# Patient Record
Sex: Female | Born: 1955 | Race: White | Hispanic: No | Smoking: Current every day smoker
Health system: Southern US, Community
[De-identification: ages and names within clinical notes are randomized; demographics above are authoritative.]

## PROBLEM LIST (undated history)

## (undated) DIAGNOSIS — I10 Essential (primary) hypertension: Secondary | ICD-10-CM

## (undated) DIAGNOSIS — E119 Type 2 diabetes mellitus without complications: Secondary | ICD-10-CM

## (undated) DIAGNOSIS — H359 Unspecified retinal disorder: Secondary | ICD-10-CM

## (undated) DIAGNOSIS — M1990 Unspecified osteoarthritis, unspecified site: Secondary | ICD-10-CM

## (undated) HISTORY — DX: Unspecified retinal disorder: H35.9

## (undated) HISTORY — DX: Unspecified osteoarthritis, unspecified site: M19.90

## (undated) HISTORY — DX: Essential (primary) hypertension: I10

## (undated) HISTORY — PX: EYE SURGERY: SHX253

## (undated) HISTORY — PX: WRIST SURGERY: SHX841

## (undated) HISTORY — PX: FRACTURE SURGERY: SHX138

## (undated) HISTORY — PX: CHOLECYSTECTOMY: SHX55

## (undated) HISTORY — PX: TUBAL LIGATION: SHX77

## (undated) HISTORY — DX: Type 2 diabetes mellitus without complications: E11.9

## (undated) HISTORY — PX: FOOT SURGERY: SHX648

---

## 2000-12-10 ENCOUNTER — Other Ambulatory Visit: Admission: RE | Admit: 2000-12-10 | Discharge: 2000-12-10 | Payer: Self-pay | Admitting: Family Medicine

## 2001-02-27 ENCOUNTER — Encounter: Payer: Self-pay | Admitting: *Deleted

## 2001-02-27 ENCOUNTER — Emergency Department (HOSPITAL_COMMUNITY): Admission: EM | Admit: 2001-02-27 | Discharge: 2001-02-27 | Payer: Self-pay | Admitting: *Deleted

## 2001-04-23 ENCOUNTER — Encounter: Admission: RE | Admit: 2001-04-23 | Discharge: 2001-05-16 | Payer: Self-pay | Admitting: Family Medicine

## 2001-12-16 ENCOUNTER — Other Ambulatory Visit: Admission: RE | Admit: 2001-12-16 | Discharge: 2001-12-16 | Payer: Self-pay | Admitting: Family Medicine

## 2002-12-21 ENCOUNTER — Ambulatory Visit (HOSPITAL_COMMUNITY): Admission: RE | Admit: 2002-12-21 | Discharge: 2002-12-21 | Payer: Self-pay | Admitting: Ophthalmology

## 2004-05-23 ENCOUNTER — Other Ambulatory Visit: Admission: RE | Admit: 2004-05-23 | Discharge: 2004-05-23 | Payer: Self-pay | Admitting: Family Medicine

## 2007-10-02 ENCOUNTER — Ambulatory Visit (HOSPITAL_COMMUNITY): Admission: RE | Admit: 2007-10-02 | Discharge: 2007-10-02 | Payer: Self-pay | Admitting: Ophthalmology

## 2007-10-13 ENCOUNTER — Encounter: Payer: Self-pay | Admitting: Gastroenterology

## 2007-10-28 ENCOUNTER — Ambulatory Visit: Payer: Self-pay | Admitting: Gastroenterology

## 2007-11-11 ENCOUNTER — Encounter: Payer: Self-pay | Admitting: Gastroenterology

## 2007-11-11 ENCOUNTER — Ambulatory Visit: Payer: Self-pay | Admitting: Gastroenterology

## 2007-11-11 HISTORY — PX: COLONOSCOPY: SHX174

## 2007-11-17 ENCOUNTER — Encounter: Payer: Self-pay | Admitting: Gastroenterology

## 2009-12-27 ENCOUNTER — Encounter: Admission: RE | Admit: 2009-12-27 | Discharge: 2009-12-27 | Payer: Self-pay | Admitting: Family Medicine

## 2010-12-14 LAB — BASIC METABOLIC PANEL
BUN: 9
CO2: 27
Calcium: 9.3
Chloride: 106
Creatinine, Ser: 0.72
GFR calc Af Amer: >60
GFR calc non Af Amer: >60
Glucose, Bld: 119 — ABNORMAL HIGH
Potassium: 3.8
Sodium: 139

## 2010-12-14 LAB — HEMOGLOBIN AND HEMATOCRIT, BLOOD: Hemoglobin: 14.9

## 2013-05-21 ENCOUNTER — Other Ambulatory Visit (HOSPITAL_COMMUNITY): Payer: Self-pay | Admitting: Family Medicine

## 2013-05-21 ENCOUNTER — Ambulatory Visit (HOSPITAL_COMMUNITY)
Admission: RE | Admit: 2013-05-21 | Discharge: 2013-05-21 | Disposition: A | Discharge status: Home / Self Care | Payer: Disability Insurance | Source: Ambulatory Visit | Attending: Family Medicine | Admitting: Family Medicine

## 2013-05-21 DIAGNOSIS — M47817 Spondylosis without myelopathy or radiculopathy, lumbosacral region: Secondary | ICD-10-CM | POA: Insufficient documentation

## 2013-05-21 DIAGNOSIS — M545 Low back pain, unspecified: Secondary | ICD-10-CM

## 2014-04-02 ENCOUNTER — Telehealth: Payer: Self-pay | Admitting: Family Medicine

## 2014-04-02 ENCOUNTER — Encounter: Payer: Self-pay | Admitting: Family Medicine

## 2014-04-02 ENCOUNTER — Ambulatory Visit (INDEPENDENT_AMBULATORY_CARE_PROVIDER_SITE_OTHER): Payer: Self-pay | Admitting: Family Medicine

## 2014-04-02 VITALS — BP 158/88 | HR 75 | Temp 96.7°F | Ht 64.0 in | Wt 223.4 lb

## 2014-04-02 DIAGNOSIS — M25511 Pain in right shoulder: Secondary | ICD-10-CM

## 2014-04-02 MED ORDER — HYDROCODONE-ACETAMINOPHEN 5-325 MG PO TABS: 1.0000 | ORAL_TABLET | Freq: Four times a day (QID) | ORAL | Status: DC | PRN

## 2014-04-02 NOTE — Progress Notes (Signed)
   Subjective:    Patient ID: Kimberly Donaldson, female    DOB: 10/02/1955, 59 y.o.   MRN: [id number]  HPI C/o back pain that is severe radiating into her right back.  Review of Systems  Constitutional: Negative for fever.  HENT: Negative for ear pain.   Eyes: Negative for discharge.  Respiratory: Negative for cough.   Cardiovascular: Negative for chest pain.  Gastrointestinal: Negative for abdominal distention.  Endocrine: Negative for polyuria.  Genitourinary: Negative for difficulty urinating.  Musculoskeletal: Negative for gait problem and neck pain.  Skin: Negative for color change and rash.  Neurological: Negative for speech difficulty and headaches.  Psychiatric/Behavioral: Negative for agitation.       Objective:    BP 158/88 mmHg  Pulse 75  Temp(Src) 96.7 F (35.9 C) (Oral)  Ht 5\' 4"  (1.626 m)  Wt 223 lb 6.4 oz (101.334 kg)  BMI 38.33 kg/m2 Physical Exam  Constitutional: She is oriented to person, place, and time. She appears well-developed and well-nourished.  HENT:  Head: Normocephalic and atraumatic.  Mouth/Throat: Oropharynx is clear and moist.  Eyes: Pupils are equal, round, and reactive to light.  Neck: Normal range of motion. Neck supple.  Cardiovascular: Normal rate and regular rhythm.   No murmur heard. Pulmonary/Chest: Effort normal and breath sounds normal.  Abdominal: Soft. Bowel sounds are normal. There is no tenderness.  Neurological: She is alert and oriented to person, place, and time.  Skin: Skin is warm and dry.  Psychiatric: She has a normal mood and affect.          Assessment & Plan:     ICD-9-CM ICD-10-CM   1. Pain in joint, shoulder region, right 719.41 M25.511 HYDROcodone-acetaminophen (NORCO) 5-325 MG per tablet     No Follow-up on file.  Lysbeth Penner FNP

## 2014-04-02 NOTE — Telephone Encounter (Signed)
Patient does not have any insurance at this time. And she is currently not on any medications.  Appointment given for today with [facility].

## 2014-04-22 ENCOUNTER — Other Ambulatory Visit: Payer: Self-pay | Admitting: Family Medicine

## 2014-04-23 NOTE — Telephone Encounter (Signed)
Last seen and filled 04/02/14 for #30 taking q 6 hours prn

## 2014-04-23 NOTE — Telephone Encounter (Signed)
Informed pt written RF at office to be picked up

## 2014-05-13 ENCOUNTER — Ambulatory Visit (INDEPENDENT_AMBULATORY_CARE_PROVIDER_SITE_OTHER): Payer: Self-pay | Admitting: Family Medicine

## 2014-05-13 ENCOUNTER — Telehealth: Payer: Self-pay | Admitting: Family Medicine

## 2014-05-13 ENCOUNTER — Encounter: Payer: Self-pay | Admitting: Family Medicine

## 2014-05-13 VITALS — BP 146/69 | HR 73 | Temp 98.3°F | Ht 64.0 in | Wt 218.0 lb

## 2014-05-13 DIAGNOSIS — M25511 Pain in right shoulder: Secondary | ICD-10-CM

## 2014-05-13 MED ORDER — MELOXICAM 15 MG PO TABS: 15.0000 mg | ORAL_TABLET | Freq: Every day | ORAL | Status: DC

## 2014-05-13 NOTE — Patient Instructions (Addendum)
For the dry skin and itching, use some cortisone 10 cream sparingly once or twice daily for the next 10 days. Avoid all soaps, fabric softeners and detergents that are scented. Take meloxicam 15 mg 1 daily after eating for pain and inflammation and take this regularly for the next week to 10 days. Then as needed. Return to clinic tomorrow for thoracic spine film

## 2014-05-13 NOTE — Progress Notes (Signed)
   Subjective:    Patient ID: Kimberly Donaldson, female    DOB: 1956/01/20, 59 y.o.   MRN: [id number]  HPI Patient here today for right shoulder pain and possible shingles under the skin. She is accompanied today by her husband. The itching started about 4 or 5 weeks ago. The stabbing pains have been occurring more recently and are not associated with certain movements lifting pushing or pulling. 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 The patient denies any injury or falls. The pain is just in the right scapular area and it comes and goes on its own without any motivating factors.         There are no active problems to display for this patient.  Outpatient Encounter Prescriptions as of 05/13/2014  Medication Sig  . HYDROcodone-acetaminophen (NORCO/VICODIN) 5-325 MG per tablet TAKE (1) TABLET EVERY SIX HOURS AS NEEDED FOR MODERATE PAIN.- MAY MAKE DROWSY -  . ibuprofen (ADVIL,MOTRIN) 100 MG chewable tablet Chew by mouth every 8 (eight) hours as needed.    Review of Systems  Constitutional: Negative.   HENT: Negative.   Eyes: Negative.   Respiratory: Negative.   Cardiovascular: Negative.   Gastrointestinal: Negative.   Endocrine: Negative.   Genitourinary: Negative.   Musculoskeletal:       Burning pain, stabbing pain and itching sensation around right scapula.   Skin: Negative.   Allergic/Immunologic: Negative.   Neurological: Negative.   Hematological: Negative.   Psychiatric/Behavioral: Negative.        Objective:   Physical Exam  Constitutional: She is oriented to person, place, and time. She appears well-developed and well-nourished. No distress.  HENT:  Head: Normocephalic and atraumatic.  Eyes: Conjunctivae and EOM are normal. Pupils are equal, round, and reactive to light. Right eye exhibits no discharge. Left eye exhibits no discharge. No scleral icterus.  Neck: Normal range of motion.  Musculoskeletal: Normal range of motion. She exhibits no  tenderness.  There is no tenderness with palpation of the area where the pain occurs which is over the right scapular. There is good movement of the arm without pain.  Neurological: She is alert and oriented to person, place, and time.  Skin: Skin is warm and dry. No rash noted. No erythema.  Is dry skin over the back. But there is no rash or vesicles etc.  Psychiatric: She has a normal mood and affect. Her behavior is normal. Judgment and thought content normal.  Nursing note and vitals reviewed.  BP 146/69 mmHg  Pulse 73  Temp(Src) 98.3 F (36.8 C) (Oral)  Ht 5\' 4"  (1.626 m)  Wt 218 lb (98.884 kg)  BMI 37.40 kg/m2        Assessment & Plan:  1. Right shoulder pain -Thoracic spine films tomorrow -Meloxicam 15 one daily after eating  Patient Instructions  For the dry skin and itching, use some cortisone 10 cream sparingly once or twice daily for the next 10 days. Avoid all soaps, fabric softeners and detergents that are scented. Take meloxicam 15 mg 1 daily after eating for pain and inflammation and take this regularly for the next week to 10 days. Then as needed. Return to clinic tomorrow for thoracic spine film   Arrie Senate MD

## 2014-05-13 NOTE — Telephone Encounter (Signed)
Appt made for today

## 2014-05-14 ENCOUNTER — Other Ambulatory Visit (INDEPENDENT_AMBULATORY_CARE_PROVIDER_SITE_OTHER): Payer: Self-pay

## 2014-05-14 ENCOUNTER — Ambulatory Visit: Payer: Self-pay | Admitting: Family

## 2014-05-14 DIAGNOSIS — M25511 Pain in right shoulder: Secondary | ICD-10-CM

## 2014-05-19 ENCOUNTER — Encounter (HOSPITAL_COMMUNITY): Payer: Self-pay | Admitting: Emergency Medicine

## 2014-05-19 ENCOUNTER — Emergency Department (HOSPITAL_COMMUNITY): Payer: Self-pay

## 2014-05-19 ENCOUNTER — Emergency Department (HOSPITAL_COMMUNITY)
Admission: EM | Admit: 2014-05-19 | Discharge: 2014-05-19 | Disposition: A | Discharge status: Home / Self Care | Payer: Self-pay | Attending: Emergency Medicine | Admitting: Emergency Medicine

## 2014-05-19 DIAGNOSIS — R0789 Other chest pain: Secondary | ICD-10-CM

## 2014-05-19 DIAGNOSIS — E119 Type 2 diabetes mellitus without complications: Secondary | ICD-10-CM | POA: Insufficient documentation

## 2014-05-19 DIAGNOSIS — R748 Abnormal levels of other serum enzymes: Secondary | ICD-10-CM

## 2014-05-19 DIAGNOSIS — Z720 Tobacco use: Secondary | ICD-10-CM | POA: Insufficient documentation

## 2014-05-19 DIAGNOSIS — R1011 Right upper quadrant pain: Secondary | ICD-10-CM

## 2014-05-19 DIAGNOSIS — Z791 Long term (current) use of non-steroidal anti-inflammatories (NSAID): Secondary | ICD-10-CM | POA: Insufficient documentation

## 2014-05-19 DIAGNOSIS — Z9889 Other specified postprocedural states: Secondary | ICD-10-CM | POA: Insufficient documentation

## 2014-05-19 DIAGNOSIS — R109 Unspecified abdominal pain: Secondary | ICD-10-CM

## 2014-05-19 DIAGNOSIS — E669 Obesity, unspecified: Secondary | ICD-10-CM | POA: Insufficient documentation

## 2014-05-19 LAB — URINALYSIS, ROUTINE W REFLEX MICROSCOPIC
BILIRUBIN URINE: NEGATIVE
Glucose, UA: >1000 mg/dL — AB
Hgb urine dipstick: NEGATIVE
KETONES UR: NEGATIVE mg/dL
LEUKOCYTES UA: NEGATIVE
NITRITE: NEGATIVE
PROTEIN: NEGATIVE mg/dL
Specific Gravity, Urine: 1.01 (ref 1.005–1.030)
Urobilinogen, UA: 0.2 mg/dL (ref 0.0–1.0)
pH: 5.5 (ref 5.0–8.0)

## 2014-05-19 LAB — BASIC METABOLIC PANEL
Anion gap: 9 (ref 5–15)
BUN: 17 mg/dL (ref 6–23)
CO2: 24 mmol/L (ref 19–32)
CREATININE: 0.64 mg/dL (ref 0.50–1.10)
Calcium: 8.8 mg/dL (ref 8.4–10.5)
Chloride: 101 mmol/L (ref 96–112)
GFR calc Af Amer: >90 mL/min (ref >90)
GFR calc non Af Amer: >90 mL/min (ref >90)
GLUCOSE: 341 mg/dL — AB (ref 70–99)
Potassium: 4.2 mmol/L (ref 3.5–5.1)
Sodium: 134 mmol/L — ABNORMAL LOW (ref 135–145)

## 2014-05-19 LAB — CBC WITH DIFFERENTIAL/PLATELET
BASOS ABS: 0.1 10*3/uL (ref 0.0–0.1)
BASOS PCT: 1 % (ref 0–1)
EOS ABS: 0.1 10*3/uL (ref 0.0–0.7)
Eosinophils Relative: 1 % (ref 0–5)
HCT: 44.5 % (ref 36.0–46.0)
HEMOGLOBIN: 15.7 g/dL — AB (ref 12.0–15.0)
Lymphocytes Relative: 34 % (ref 12–46)
Lymphs Abs: 3.4 10*3/uL (ref 0.7–4.0)
MCH: 29.8 pg (ref 26.0–34.0)
MCHC: 35.3 g/dL (ref 30.0–36.0)
MCV: 84.6 fL (ref 78.0–100.0)
MONOS PCT: 7 % (ref 3–12)
Monocytes Absolute: 0.8 10*3/uL (ref 0.1–1.0)
NEUTROS ABS: 5.8 10*3/uL (ref 1.7–7.7)
NEUTROS PCT: 57 % (ref 43–77)
Platelets: 206 10*3/uL (ref 150–400)
RBC: 5.26 MIL/uL — ABNORMAL HIGH (ref 3.87–5.11)
RDW: 12.9 % (ref 11.5–15.5)
WBC: 10.2 10*3/uL (ref 4.0–10.5)

## 2014-05-19 LAB — URINE MICROSCOPIC-ADD ON

## 2014-05-19 LAB — LIPASE, BLOOD: LIPASE: 92 U/L — AB (ref 11–59)

## 2014-05-19 MED ORDER — METHYLPREDNISOLONE 4 MG PO KIT: PACK | ORAL | Status: DC

## 2014-05-19 MED ORDER — GABAPENTIN 300 MG PO CAPS: 300.0000 mg | ORAL_CAPSULE | Freq: Two times a day (BID) | ORAL | Status: DC

## 2014-05-19 MED ORDER — OXYCODONE-ACETAMINOPHEN 5-325 MG PO TABS: 1.0000 | ORAL_TABLET | ORAL | Status: DC | PRN

## 2014-05-19 MED ORDER — IOHEXOL 300 MG/ML  SOLN
100.0000 mL | Freq: Once | INTRAMUSCULAR | Status: AC | PRN
  Administered 2014-05-19: 100 mL via INTRAVENOUS

## 2014-05-19 MED ORDER — IOHEXOL 300 MG/ML  SOLN
25.0000 mL | Freq: Once | INTRAMUSCULAR | Status: AC | PRN
  Administered 2014-05-19: 25 mL via ORAL

## 2014-05-19 MED ORDER — HYDROMORPHONE HCL 1 MG/ML IJ SOLN
1.0000 mg | Freq: Once | INTRAMUSCULAR | Status: AC
  Administered 2014-05-19: 1 mg via INTRAVENOUS
  Filled 2014-05-19: qty 1

## 2014-05-19 MED ORDER — OXYCODONE-ACETAMINOPHEN 5-325 MG PO TABS: 2.0000 | ORAL_TABLET | ORAL | Status: DC | PRN

## 2014-05-19 NOTE — ED Provider Notes (Addendum)
CSN: [id number]     Arrival date & time 05/19/14  4076 History  This chart was scribed for Kimberly Christen, MD by Einar Pheasant, ED Scribe. This patient was seen in room APA15/APA15 and the patient's care was started at 9:20 AM.    Chief Complaint  Patient presents with  . Abdominal Pain   The history is provided by the patient and medical records. No language interpreter was used.   HPI Comments: Kimberly Donaldson is a 59 y.o. female with PMhx of DM without complications, 4 C-sections, and cholesystectomy presents to the Emergency Department complaining of sudden onset RUQ abdominal pain that started 2 days ago. She states that the pain is worsened by movement and describes it at "sharp" in nature. Pt went to to her PCP last week concerned that she may have shingles, secondary to her right shoulder pain that she was experiencing at the time.  Pt denies any appetite change, fever, neck pain, sore throat, visual disturbance, CP, cough, SOB, abdominal pain, nausea, emesis, diarrhea, urinary symptoms, back pain, HA, weakness, numbness and rash as associated symptoms.     Past Medical History  Diagnosis Date  . Diabetes mellitus without complication    Past Surgical History  Procedure Laterality Date  . Cesarean section    . Foot surgery    . Wrist surgery     Family History  Problem Relation Age of Onset  . Cancer Mother   . Cancer Father   . Diabetes Father    History  Substance Use Topics  . Smoking status: Current Some Day Smoker -- 1.00 packs/day  . Smokeless tobacco: Not on file  . Alcohol Use: No   OB History    Gravida Para Term Preterm AB TAB SAB Ectopic Multiple Living   5 4 4  1 1          Review of Systems  Constitutional: Negative for fever and chills.  HENT: Negative for congestion and sore throat.   Eyes: Negative for visual disturbance.  Respiratory: Negative for cough and shortness of breath.   Cardiovascular: Negative for chest pain and leg swelling.   Gastrointestinal: Positive for abdominal pain. Negative for nausea, vomiting and diarrhea.  Genitourinary: Negative for dysuria.  Musculoskeletal: Negative for back pain and neck pain.  Skin: Negative for rash.  Neurological: Negative for headaches.  Hematological: Does not bruise/bleed easily.  Psychiatric/Behavioral: Negative for confusion.   Allergies  Codeine; Morphine; and Other  Home Medications   Prior to Admission medications   Medication Sig Start Date End Date Taking? Authorizing Provider  HYDROcodone-acetaminophen (NORCO/VICODIN) 5-325 MG per tablet TAKE (1) TABLET EVERY SIX HOURS AS NEEDED FOR MODERATE PAIN.- MAY MAKE DROWSY - 04/23/14  Yes Lysbeth Penner, FNP  ibuprofen (ADVIL,MOTRIN) 200 MG tablet Take 800 mg by mouth every 6 (six) hours as needed for moderate pain.   Yes Historical Provider, MD  meloxicam (MOBIC) 15 MG tablet Take 1 tablet (15 mg total) by mouth daily. 05/13/14  Yes Chipper Herb, MD   BP 190/76 mmHg  Pulse 71  Temp(Src) 97.5 F (36.4 C) (Oral)  Resp 20  Ht 5\' 4"  (1.626 m)  Wt 215 lb (97.523 kg)  BMI 36.89 kg/m2  SpO2 100%  Physical Exam  Constitutional: She is oriented to person, place, and time. She appears well-developed and well-nourished.  Pt is obese.  HENT:  Head: Normocephalic and atraumatic.  Eyes: Conjunctivae and EOM are normal. Pupils are equal, round, and reactive to light.  Neck: Normal range of motion. Neck supple.  Cardiovascular: Normal rate and regular rhythm.   Pulmonary/Chest: Effort normal and breath sounds normal.  Abdominal: Soft. Bowel sounds are normal. There is tenderness in the right upper quadrant.  Musculoskeletal: Normal range of motion.  Oblique surgical scar to RUQ. Papular erythematous rash under her right breast.   Neurological: She is alert and oriented to person, place, and time.  Skin: Skin is warm and dry.  Psychiatric: She has a normal mood and affect. Her behavior is normal.  Nursing note and vitals  reviewed.   ED Course  Procedures (including critical care time)  DIAGNOSTIC STUDIES: Oxygen Saturation is 100% on RA, normal by my interpretation.    COORDINATION OF CARE: 9:30 AM- Will order pain medicine. Will order an abdominal CT.  Pt advised of plan for treatment and pt agrees.  Labs Review Labs Reviewed  CBC WITH DIFFERENTIAL/PLATELET - Abnormal; Notable for the following:    RBC 5.26 (*)    Hemoglobin 15.7 (*)    All other components within normal limits  BASIC METABOLIC PANEL - Abnormal; Notable for the following:    Sodium 134 (*)    Glucose, Bld 341 (*)    All other components within normal limits  LIPASE, BLOOD - Abnormal; Notable for the following:    Lipase 92 (*)    All other components within normal limits  URINALYSIS, ROUTINE W REFLEX MICROSCOPIC - Abnormal; Notable for the following:    Glucose, UA >1000 (*)    All other components within normal limits  URINE MICROSCOPIC-ADD ON - Abnormal; Notable for the following:    Squamous Epithelial / LPF FEW (*)    Bacteria, UA FEW (*)    All other components within normal limits    Imaging Review No results found.   EKG Interpretation None      MDM   Final diagnoses:  Abdominal pain  Elevated lipase    No obvious acute abdomen. Lipase noted to be elevated [92]. Glucose 341.  CT A/P pending.  Discussed with pt and Dr Alyson Locket  I personally performed the services described in this documentation, which was scribed in my presence. The recorded information has been reviewed and is accurate.    Kimberly Christen, MD 05/19/14 [place], MD 05/19/14 [phone]

## 2014-05-19 NOTE — ED Notes (Signed)
Pt reports RUQ pain since 2 days ago.

## 2014-05-19 NOTE — ED Provider Notes (Signed)
Care assumed from Dr. Lacinda Axon. Patient history reviewed. Patient examined. Pain started a month ago with pain to her scapula. Progressed around under her right breast in her ribs. On exam is not reproducible subcostal and epigastric abdomen. No peritoneal irritation. No GI complaints. Is ongoing. However does not have gastritis or GI related symptoms. Has gotten no relief on the mobic.  No food related changes, n/v/d.  Developed a rash suggestive of zoster. She states her primary care physician was concerned that it was "a pinched nerve", thus the anti-inflammatory prescription.  She is not hypoxemic, saturations 90%. Resting heart rate 67. No risks for PE. Pain is not pleuritic. His clearly worsened with movement and activity with palpation.  She may need an MRI of her thoracic spine. I've offered her a prescription for pain medication, Medrol Dosepak, and Neurontin. Encourage her to follow-up with her physician, Dr. Laurance Flatten, at  [facility] family [facility].    Tanna Furry, MD 05/19/14 1626

## 2014-05-19 NOTE — Discharge Instructions (Signed)
Your pain may be related to an irritated nerve root in the thoracic spine. You may ultimately need an MRI. Please follow-up with her primary care physician. Return to ER with rash, vomiting, fever, cough, or other new or worsening symptoms.    Abdominal Pain Many things can cause abdominal pain. Usually, abdominal pain is not caused by a disease and will improve without treatment. It can often be observed and treated at home. Your health care provider will do a physical exam and possibly order blood tests and X-rays to help determine the seriousness of your pain. However, in many cases, more time must pass before a clear cause of the pain can be found. Before that point, your health care provider may not know if you need more testing or further treatment. HOME CARE INSTRUCTIONS  Monitor your abdominal pain for any changes. The following actions may help to alleviate any discomfort you are experiencing:  Only take over-the-counter or prescription medicines as directed by your health care provider.  Do not take laxatives unless directed to do so by your health care provider.  Try a clear liquid diet (broth, tea, or water) as directed by your health care provider. Slowly move to a bland diet as tolerated. SEEK MEDICAL CARE IF:  You have unexplained abdominal pain.  You have abdominal pain associated with nausea or diarrhea.  You have pain when you urinate or have a bowel movement.  You experience abdominal pain that wakes you in the night.  You have abdominal pain that is worsened or improved by eating food.  You have abdominal pain that is worsened with eating fatty foods.  You have a fever. SEEK IMMEDIATE MEDICAL CARE IF:   Your pain does not go away within 2 hours.  You keep throwing up (vomiting).  Your pain is felt only in portions of the abdomen, such as the right side or the left lower portion of the abdomen.  You pass bloody or black tarry stools. MAKE SURE  YOU:  Understand these instructions.   Will watch your condition.   Will get help right away if you are not doing well or get worse.  Document Released: 12/13/2004 Document Revised: 03/10/2013 Document Reviewed: 11/12/2012 [facility] Patient Information 2015 [place], [place]. This information is not intended to replace advice given to you by your health care provider. Make sure you discuss any questions you have with your health care provider. Chest Wall Pain Chest wall pain is pain in or around the bones and muscles of your chest. It may take up to 6 weeks to get better. It may take longer if you must stay physically active in your work and activities.  CAUSES  Chest wall pain may happen on its own. However, it may be caused by:  A viral illness like the flu.  Injury.  Coughing.  Exercise.  Arthritis.  Fibromyalgia.  Shingles. HOME CARE INSTRUCTIONS   Avoid overtiring physical activity. Try not to strain or perform activities that cause pain. This includes any activities using your chest or your abdominal and side muscles, especially if heavy weights are used.  Put ice on the sore area.  Put ice in a plastic bag.  Place a towel between your skin and the bag.  Leave the ice on for 15-20 minutes per hour while awake for the first 2 days.  Only take over-the-counter or prescription medicines for pain, discomfort, or fever as directed by your caregiver. SEEK IMMEDIATE MEDICAL CARE IF:   Your pain increases, or  you are very uncomfortable.  You have a fever.  Your chest pain becomes worse.  You have new, unexplained symptoms.  You have nausea or vomiting.  You feel sweaty or lightheaded.  You have a cough with phlegm (sputum), or you cough up blood. MAKE SURE YOU:   Understand these instructions.  Will watch your condition.  Will get help right away if you are not doing well or get worse. Document Released: 03/05/2005 Document Revised: 05/28/2011 Document  Reviewed: 10/30/2010 [facility] Patient Information 2015 [place], [place]. This information is not intended to replace advice given to you by your health care provider. Make sure you discuss any questions you have with your health care provider.

## 2014-05-28 ENCOUNTER — Telehealth: Payer: Self-pay | Admitting: Family

## 2014-05-28 NOTE — Telephone Encounter (Signed)
Appointment scheduled for Monday with [facility] for hospital follow up

## 2014-05-31 ENCOUNTER — Encounter: Payer: Self-pay | Admitting: Family

## 2014-05-31 ENCOUNTER — Ambulatory Visit (INDEPENDENT_AMBULATORY_CARE_PROVIDER_SITE_OTHER): Payer: Self-pay | Admitting: Family

## 2014-05-31 VITALS — BP 144/74 | HR 85 | Temp 97.6°F | Ht 64.0 in | Wt 215.8 lb

## 2014-05-31 DIAGNOSIS — Z09 Encounter for follow-up examination after completed treatment for conditions other than malignant neoplasm: Secondary | ICD-10-CM

## 2014-05-31 DIAGNOSIS — M5412 Radiculopathy, cervical region: Secondary | ICD-10-CM

## 2014-05-31 MED ORDER — HYDROCODONE-ACETAMINOPHEN 5-325 MG PO TABS: ORAL_TABLET | ORAL | Status: DC

## 2014-05-31 MED ORDER — KETOROLAC TROMETHAMINE 60 MG/2ML IM SOLN
60.0000 mg | Freq: Once | INTRAMUSCULAR | Status: AC
  Administered 2014-05-31: 60 mg via INTRAMUSCULAR

## 2014-05-31 MED ORDER — GABAPENTIN 300 MG PO CAPS: 300.0000 mg | ORAL_CAPSULE | Freq: Three times a day (TID) | ORAL | Status: DC

## 2014-05-31 NOTE — Patient Instructions (Signed)

## 2014-05-31 NOTE — Progress Notes (Signed)
Subjective:    Patient ID: Kimberly Donaldson, female    DOB: [birth year], 59 y.o.   MRN: [id number]  HPI Pt presents to the office today for hospital follow up for RUQ abd pain and right shoulder pain. Pt had a CT done that was negative.  Pt given pain medication and gabapentin for the shoulder pain. Pt states today is a "good day". Pt states she usually can't even wear her bra because she can't stand anything touching her shoulder. Pt denies any headache, palpitations, SOB, or edema at this time. Pt states she is no longer having abd pain at this time.      Review of Systems  Constitutional: Negative.   HENT: Negative.   Eyes: Negative.   Respiratory: Negative.  Negative for shortness of breath.   Cardiovascular: Negative.  Negative for palpitations.  Gastrointestinal: Negative.   Endocrine: Negative.   Genitourinary: Negative.   Musculoskeletal: Negative.   Neurological: Negative.  Negative for headaches.  Hematological: Negative.   Psychiatric/Behavioral: Negative.   All other systems reviewed and are negative.      Objective:   Physical Exam  Constitutional: She is oriented to person, place, and time. She appears well-developed and well-nourished. No distress.  HENT:  Head: Normocephalic and atraumatic.  Right Ear: External ear normal.  Left Ear: External ear normal.  Nose: Nose normal.  Mouth/Throat: Oropharynx is clear and moist.  Eyes: Pupils are equal, round, and reactive to light.  Neck: Normal range of motion. Neck supple. No thyromegaly present.  Cardiovascular: Normal rate, regular rhythm, normal heart sounds and intact distal pulses.   No murmur heard. Pulmonary/Chest: Effort normal and breath sounds normal. No respiratory distress. She has no wheezes.  Abdominal: Soft. Bowel sounds are normal. She exhibits no distension. There is no tenderness.  Musculoskeletal: Normal range of motion. She exhibits no edema or tenderness (no  tenderness on right shoulder blade  and Full ROM).  Neurological: She is alert and oriented to person, place, and time. She has normal reflexes. No cranial nerve deficit.  Skin: Skin is warm and dry.  Psychiatric: She has a normal mood and affect. Her behavior is normal. Judgment and thought content normal.  Vitals reviewed.    BP 144/74 mmHg  Pulse 85  Temp(Src) 97.6 F (36.4 C) (Oral)  Ht 5\' 4"  (1.626 m)  Wt 215 lb 12.8 oz (97.886 kg)  BMI 37.02 kg/m2      Assessment & Plan:  1. Cervical radiculopathy, acute -Rest Ice if helps -RTO prn - ketorolac (TORADOL) injection 60 mg; Inject 2 mLs (60 mg total) into the muscle once. - gabapentin (NEURONTIN) 300 MG capsule; Take 1 capsule (300 mg total) by mouth 3 (three) times daily.  Dispense: 270 capsule; Refill: 1 Meds ordered this encounter  Medications  . ketorolac (TORADOL) injection 60 mg    Sig:   . gabapentin (NEURONTIN) 300 MG capsule    Sig: Take 1 capsule (300 mg total) by mouth 3 (three) times daily.    Dispense:  270 capsule    Refill:  1    Order Specific Question:  Supervising Provider    Answer:  Chipper Herb [[id number]]  . HYDROcodone-acetaminophen (NORCO/VICODIN) 5-325 MG per tablet    Sig: TAKE (1) TABLET EVERY SIX HOURS AS NEEDED FOR MODERATE PAIN.- MAY MAKE DROWSY -    Dispense:  60 tablet    Refill:  0    Order Specific Question:  Supervising Provider    Answer:  Laurance Flatten,  Estella Husk [[id number]]    2. Hospital discharge follow-up  Evelina Dun, FNP

## 2014-06-02 MED FILL — Oxycodone w/ Acetaminophen Tab 5-325 MG: ORAL | Qty: 6 | Status: AC

## 2014-06-29 ENCOUNTER — Telehealth: Payer: Self-pay | Admitting: Family

## 2014-06-29 ENCOUNTER — Other Ambulatory Visit: Payer: Self-pay | Admitting: Family

## 2014-06-29 DIAGNOSIS — M5412 Radiculopathy, cervical region: Secondary | ICD-10-CM

## 2014-06-29 MED ORDER — HYDROCODONE-ACETAMINOPHEN 5-325 MG PO TABS: ORAL_TABLET | ORAL | Status: DC

## 2014-06-29 NOTE — Telephone Encounter (Signed)
RX ready for pick up 

## 2014-06-29 NOTE — Telephone Encounter (Signed)
Patient aware rx ready to be picked up 

## 2014-07-21 ENCOUNTER — Other Ambulatory Visit: Payer: Self-pay | Admitting: Family

## 2014-07-21 NOTE — Telephone Encounter (Signed)
Last OV christy- 05/2014 Last fill 06/29/14.  If approved it will print

## 2014-07-21 NOTE — Telephone Encounter (Signed)
RX ready for pick up 

## 2014-07-22 NOTE — Telephone Encounter (Signed)
Aware,script for pain medication ready. 

## 2014-07-23 ENCOUNTER — Other Ambulatory Visit: Payer: Self-pay | Admitting: *Deleted

## 2014-08-26 ENCOUNTER — Other Ambulatory Visit: Payer: Self-pay | Admitting: Family

## 2014-08-26 NOTE — Telephone Encounter (Signed)
Aware, pain script ready.

## 2014-08-26 NOTE — Telephone Encounter (Signed)
Last seen 05/31/14 Kimberly Donaldson  If approved print

## 2014-08-26 NOTE — Telephone Encounter (Signed)
Rx ready for pick up. 

## 2014-09-24 ENCOUNTER — Ambulatory Visit (INDEPENDENT_AMBULATORY_CARE_PROVIDER_SITE_OTHER): Payer: Self-pay | Admitting: Physician Assistant

## 2014-09-24 ENCOUNTER — Encounter: Payer: Self-pay | Admitting: Physician Assistant

## 2014-09-24 VITALS — BP 151/79 | HR 86 | Temp 97.9°F | Ht 64.0 in | Wt 210.0 lb

## 2014-09-24 DIAGNOSIS — A281 Cat-scratch disease: Secondary | ICD-10-CM

## 2014-09-24 MED ORDER — CEPHALEXIN 500 MG PO CAPS: 500.0000 mg | ORAL_CAPSULE | Freq: Three times a day (TID) | ORAL | Status: DC

## 2014-09-24 NOTE — Patient Instructions (Signed)
Cat Scratch Disease Cats often injure people by scratching or biting. This site of injury can become infected with a particular germ or bacteria present in the mouth of or on the cat. This germ is called Bartonella henselae. This infection is identified by the common name cat scratch disease (CSD).  SYMPTOMS  A red and sore pimple or bump, with or without pus, on the skin where the cat scratched or bit. The pimple or sore may be present for as long as three weeks after the scratch or bite occurred.  One or more enlarged lymph glands located toward the center of the body from where the injury occurred.  Less common symptoms include low-grade fever, tiredness, fatigue, headache and/or sore throat. DIAGNOSIS  The diagnosis is typically made by your caregiver who notes the history of a scratch or bite from a cat, and finds the skin sore and swollen lymph glands in the described area.  Culture of any drainage or pus from the injury site, or a needle aspiration or piece of tissue (biopsy) from a swollen lymph gland may also be done to confirm the diagnosis and assure that a different infection or disease is not causing your illness. Rare but serious complications may occur, they include:  Parinaud's syndrome - fever, swollen lymph glands and inflammation of the eye (conjunctivitis).  Infection of the brain (encephalitis).  Infection of the nerve of the eye (neuroretinitis).  Infection of the bone (osteomyelitis). TREATMENT  Usually treatment is not necessary or helpful, especially if you have a normal immune system. When infection is very severe, it may be treated with a medicine that kills the bacteria (antibiotic).  People with immune system problems (such as having AIDS or an organ transplant, or being on steroids or other immune modifying drugs) should be treated with antibiotics. HOME CARE INSTRUCTIONS   Avoid injury while playing with cats.  Wash well after playing with cats.  Do  not let your cat lick sores on your body.  Do not let your cat roam around outside of your house.  Keep the area of the cat scratch clean. Wash it with soap and water or apply an antiseptic solution such as povidone iodine.  You should get a tetanus shot if you have not had one in the past 5 or 10 years. If you receive one, your arm may get swollen and red and warm to the touch at the shot site. This is a common response to the medication in the shot. If you did not receive a tetanus shot here because you did not recall when your last one was given, make sure to check with your caregiver's office and determine if one is needed. Generally, for a "dirty" wound, you should receive a tetanus booster if you have not had one in the last five years. If you have a "clean" wound, you should receive a tetanus booster if you have not had one in the last ten years. SEEK IMMEDIATE MEDICAL CARE IF:   You have worsening signs of infection, such as more redness, increased pain, red streaking or pus coming from the wound, or warmth or swelling around the area of the scratch.  You develop worsening swollen lymph glands.  You develop abdominal pain, have problems with your vision or develop a skin rash.  You have a fever.  You become more tired or dizzy, or have a worsening headache.  You develop inflammation of your eye or have increasing vision problems.  You have pain in  one of your bones.  You develop a stiff neck.  You pass out. MAKE SURE YOU:   Understand these instructions.  Will watch your condition.  Will get help right away if you are not doing well or get worse. Document Released: 03/02/2000 Document Revised: 05/28/2011 Document Reviewed: 04/14/2008 [facility] Patient Information 2015 [place], [place]. This information is not intended to replace advice given to you by your health care provider. Make sure you discuss any questions you have with your health care provider.

## 2014-09-24 NOTE — Progress Notes (Signed)
   Subjective:    Patient ID: Kimberly Donaldson, female    DOB: 21-Mar-1955, 59 y.o.   MRN: [id number]  HPI 59 y/o female with comorbid DM type 2, controlled by diet, presents with c/o recent cat scratch on her right foot 3rd toe x 2 days. She started having swelling and redness yesterday with pain. She cleaned it with peroxide and soaked with soapy water and betadine. The swelling has progressed proximal up her foot and ankle. She states that the cat is up to date on vaccines but she is unsure about her last tetanus vaccine    Review of Systems  Constitutional: Positive for chills. Negative for fever, diaphoresis and fatigue.  Cardiovascular: Positive for leg swelling (right foot ). Negative for chest pain.  Gastrointestinal: Negative for nausea and vomiting.  Endocrine: Negative.   Genitourinary: Negative.   Skin: Positive for color change (redness and swelling of right foot, bruising on right foot, 3rd toe) and wound (right foot, 3rd toe).  All other systems reviewed and are negative.      Objective:   Physical Exam  Constitutional: She is oriented to person, place, and time. She appears well-developed and well-nourished. No distress.  Musculoskeletal: She exhibits edema (right foot including toes, extending proximal into ankle) and tenderness (right foot ).  Neurological: She is alert and oriented to person, place, and time.  Skin: She is not diaphoretic. There is erythema.  Ecchymosis of 3rd toe, right foot   Psychiatric: She has a normal mood and affect. Her behavior is normal. Judgment and thought content normal.  Nursing note and vitals reviewed.         Assessment & Plan:  1. Cat-scratch disease - Patient needs tetanus vaccine. Due to private pay and cost in office, I have advised her to [facility] I have emphasized the importance of this and she understands and agrees to go to HD.  - cephALEXin (KEFLEX) 500 MG capsule; Take 1 capsule (500 mg total) by mouth 3  (three) times daily.  Dispense: 30 capsule; Refill: 0  - Epson salt soaks BID Clean with 1/2 peroxide and 1/2 sterile water, keep covered to prevent further contamination  - If symptoms do not improve or worsen, RTC or ED.  Hildreth Orsak A. Benjamin Stain PA-C

## 2014-09-29 ENCOUNTER — Other Ambulatory Visit: Payer: Self-pay | Admitting: Family

## 2014-09-29 NOTE — Telephone Encounter (Signed)
Last seen 09/24/14 Tiffany  If approved print

## 2014-09-30 NOTE — Telephone Encounter (Signed)
Kimberly Donaldson,  Can you please address this patient and approve if you feel needed.   Thanks [name] A. Benjamin Stain PA-C

## 2014-10-01 NOTE — Telephone Encounter (Signed)
RX ready for pick up 

## 2014-10-01 NOTE — Telephone Encounter (Signed)
Patient aware rx is ready to be picked up 

## 2014-10-04 ENCOUNTER — Encounter: Payer: Self-pay | Admitting: *Deleted

## 2014-10-12 ENCOUNTER — Encounter: Payer: Self-pay | Admitting: Physician Assistant

## 2014-10-12 ENCOUNTER — Ambulatory Visit (INDEPENDENT_AMBULATORY_CARE_PROVIDER_SITE_OTHER): Payer: Self-pay | Admitting: Physician Assistant

## 2014-10-12 VITALS — BP 127/69 | HR 80 | Temp 97.5°F | Ht 64.0 in | Wt 210.6 lb

## 2014-10-12 DIAGNOSIS — E1165 Type 2 diabetes mellitus with hyperglycemia: Secondary | ICD-10-CM

## 2014-10-12 DIAGNOSIS — IMO0002 Reserved for concepts with insufficient information to code with codable children: Secondary | ICD-10-CM

## 2014-10-12 DIAGNOSIS — L089 Local infection of the skin and subcutaneous tissue, unspecified: Secondary | ICD-10-CM

## 2014-10-12 MED ORDER — SULFAMETHOXAZOLE-TRIMETHOPRIM 800-160 MG PO TABS: 1.0000 | ORAL_TABLET | Freq: Two times a day (BID) | ORAL | Status: DC

## 2014-10-12 MED ORDER — CIPROFLOXACIN HCL 500 MG PO TABS: 500.0000 mg | ORAL_TABLET | Freq: Two times a day (BID) | ORAL | Status: DC

## 2014-10-12 NOTE — Progress Notes (Signed)
   Subjective:    Patient ID: Kimberly Donaldson, female    DOB: 1955/07/01, 59 y.o.   MRN: [id number]  HPI 59 y/o female with comorbid uncontrolled DM type 2 presents for follow up of cat scratch on her right foot, 3rd toe. She was given Keflex, which has helped the infection and inflammation some but the toe is still draining and inflamed. She has also soaked it in epsom salt soak once daily . She states that she does not have insurance so does not manage her DM, have labs or go to the dr.     Review of Systems  Constitutional: Negative.   HENT: Negative.   Musculoskeletal: Positive for joint swelling.  Skin: Positive for color change (erythematous and inflamed ) and wound (.37mm ulcer on right foot 3rd toe).  Neurological: Negative.        Objective:   Physical Exam  Constitutional: She is oriented to person, place, and time. She appears well-developed and well-nourished.  Cardiovascular: Intact distal pulses.   Abdominal:  Obese    Neurological: She is alert and oriented to person, place, and time.  Skin: There is erythema.  .14mm ulcer on right foot, 3rd toe Erythema and inflamed. Slightly ecchymotic in color Feet appear dirty and nails ungroomed   Psychiatric: She has a normal mood and affect. Her behavior is normal. Judgment and thought content normal.  Nursing note and vitals reviewed.         Assessment & Plan:  1. Toe infection - Epsom salt soak TID  - ciprofloxacin (CIPRO) 500 MG tablet; Take 1 tablet (500 mg total) by mouth 2 (two) times daily.  Dispense: 20 tablet; Refill: 0 - sulfamethoxazole-trimethoprim (BACTRIM DS,SEPTRA DS) 800-160 MG per tablet; Take 1 tablet by mouth 2 (two) times daily.  Dispense: 20 tablet; Refill: 0 - Aerobic culture  2. Diabetes type 2, uncontrolled -I have discussed with patient the importance of managing her DM with regards to healing of this infection and long term care. I have also pointed out to her that there are many diabetic  medications that are available for $4 at [facility]. She agrees to have her Hgb A1C today and I will attempt to start diabetic mgmt based on results of lab.   - Hemoglobin A1C   RTC 2 weeks for recheck or sooner if infection worsens  Tiffany A. Benjamin Stain PA-C

## 2014-10-13 LAB — HEMOGLOBIN A1C
ESTIMATED AVERAGE GLUCOSE: 269 mg/dL
HEMOGLOBIN A1C: 11 % — AB (ref 4.8–5.6)

## 2014-10-14 LAB — AEROBIC CULTURE

## 2014-11-15 ENCOUNTER — Other Ambulatory Visit: Payer: Self-pay | Admitting: Family

## 2014-11-15 NOTE — Telephone Encounter (Signed)
Last filled 10/01/14, last seen 05/31/14.

## 2014-11-16 MED ORDER — HYDROCODONE-ACETAMINOPHEN 5-325 MG PO TABS: ORAL_TABLET | ORAL | Status: DC

## 2014-11-16 NOTE — Telephone Encounter (Signed)
Pt aware written Rx is at front desk ready for pickup  

## 2014-11-16 NOTE — Telephone Encounter (Signed)
RX ready for pick up. Pt needs to make chronic follow up appt

## 2015-01-03 ENCOUNTER — Other Ambulatory Visit: Payer: Self-pay | Admitting: Family Medicine

## 2015-01-03 MED ORDER — HYDROCODONE-ACETAMINOPHEN 5-325 MG PO TABS: ORAL_TABLET | ORAL | Status: DC

## 2015-01-03 NOTE — Telephone Encounter (Signed)
RX ready for pick up 

## 2015-01-03 NOTE — Telephone Encounter (Signed)
Patient's husband aware °

## 2015-01-03 NOTE — Telephone Encounter (Signed)
Last filled 11/16/14, last seen by Tiffany on 10/12/14

## 2015-01-31 ENCOUNTER — Other Ambulatory Visit: Payer: Self-pay | Admitting: Family

## 2015-02-01 NOTE — Telephone Encounter (Signed)
Seen 10/12/14  Tiffany  If approved print

## 2015-02-03 ENCOUNTER — Ambulatory Visit (INDEPENDENT_AMBULATORY_CARE_PROVIDER_SITE_OTHER): Payer: Self-pay | Admitting: Family Medicine

## 2015-02-03 ENCOUNTER — Encounter: Payer: Self-pay | Admitting: Family Medicine

## 2015-02-03 VITALS — BP 154/84 | HR 72 | Temp 97.1°F | Ht 64.0 in | Wt 215.8 lb

## 2015-02-03 DIAGNOSIS — M5430 Sciatica, unspecified side: Secondary | ICD-10-CM | POA: Insufficient documentation

## 2015-02-03 DIAGNOSIS — M549 Dorsalgia, unspecified: Secondary | ICD-10-CM

## 2015-02-03 DIAGNOSIS — E119 Type 2 diabetes mellitus without complications: Secondary | ICD-10-CM

## 2015-02-03 DIAGNOSIS — G8929 Other chronic pain: Secondary | ICD-10-CM

## 2015-02-03 LAB — POCT GLYCOSYLATED HEMOGLOBIN (HGB A1C): HEMOGLOBIN A1C: 8.8

## 2015-02-03 MED ORDER — GLIPIZIDE 10 MG PO TABS: 10.0000 mg | ORAL_TABLET | Freq: Two times a day (BID) | ORAL | Status: DC

## 2015-02-03 MED ORDER — OXYCODONE-ACETAMINOPHEN 5-325 MG PO TABS: 1.0000 | ORAL_TABLET | Freq: Three times a day (TID) | ORAL | Status: DC | PRN

## 2015-02-03 MED ORDER — METFORMIN HCL ER (MOD) 500 MG PO TB24
500.0000 mg | ORAL_TABLET | Freq: Two times a day (BID) | ORAL | Status: DC
Start: 2015-02-03 — End: 2015-02-08

## 2015-02-03 NOTE — Progress Notes (Signed)
HPI  Patient presents today for evaluation of back pain and diabetes.   She reports several years history of back pain described as bilateral low back tightness with no midline tenderness. She is now having left leg symptoms that started over the last several weeks. She denies saddle anesthesia, bowel or bladder dysfunction, or leg weakness. She has been controlled over the last 9 months or so with hydrocodone, she takes 1-2 pills daily and has moderate pain relief with that. She previously was on Percocet and had better pain relief with that. She has a self-pay patient and working on getting disability. She does not get controlled substances from any other clinic. His is been confirmed by review the [place] controlled substance database.  Diabetes She watches her diet aggressively, she exercises as much as she can considering her back pain. She is not taking any medications for diabetes.   PMH: Smoking status noted ROS: Per HPI  Objective: BP 154/84 mmHg  Pulse 72  Temp(Src) 97.1 F (36.2 C) (Oral)  Ht 5' 4"  (1.626 m)  Wt 215 lb 12.8 oz (97.886 kg)  BMI 37.02 kg/m2 Gen: NAD, alert, cooperative with exam HEENT: NCAT CV: RRR, good S1/S2, no murmur Resp: CTABL, no wheezes, non-labored Ext: No edema, warm Neuro: Alert and oriented, 5/5 and sensation intact in bilateral lower extremities, 2+ patellar tendon reflexes bilaterally, normal gait Musculoskeletal: Paraspinal muscle tenderness to palpation, no midline tenderness, negative straight leg  Assessment and plan:  # Diabetes mellitus type 2 Uncontrolled, untreated because of financial reasons. Starting metformin, previously did not tolerate but unaware of dose Also started glipizide which have gotten that she'll tolerate easily. She's not checking blood sugars, we discussed hypoglycemia With previous A1c of 11 I will consider aggressive oral treatment for 3 months, her A1c does not improve significantly after that  she may have to go on insulin, would consider NPH or 70/30  # Chronic back pain, chronic pain syndrome She's been getting chronic pain meds in our clinic for over 6 months. I changed her from hydrocodone to oxycodone for hopefully better pain relief I have reviewed the [place] controlled substance database and she has not gotten prescriptions any clinic except for hours. I have deferred her urine drug screen as she does not have insurance I have filled 2 months prescriptions, 5 mg Percocet No. 60 I have discussed with her very clearly no prescription refills without visit.  HCM Deferred several points of need given no insuranbce Plan adding statin for >50 and DM2 next visit Plan foot exam   Orders Placed This Encounter  Procedures  . CMP14+EGFR  . POCT glycosylated hemoglobin (Hb A1C)    Meds ordered this encounter  Medications  . oxyCODONE-acetaminophen (ROXICET) 5-325 MG tablet    Sig: Take 1 tablet by mouth every 8 (eight) hours as needed for severe pain.    Dispense:  60 tablet    Refill:  0    Please do not fill before 30 days after date written  . oxyCODONE-acetaminophen (ROXICET) 5-325 MG tablet    Sig: Take 1 tablet by mouth every 8 (eight) hours as needed for severe pain.    Dispense:  60 tablet    Refill:  0  . metFORMIN (GLUMETZA) 500 MG (MOD) 24 hr tablet    Sig: Take 1 tablet (500 mg total) by mouth 2 (two) times daily with a meal.    Dispense:  60 tablet    Refill:  11  . glipiZIDE (  GLUCOTROL) 10 MG tablet    Sig: Take 1 tablet (10 mg total) by mouth 2 (two) times daily before a meal.    Dispense:  60 tablet    Refill:  [place], MD [facility] 02/03/2015, 8:37 AM

## 2015-02-03 NOTE — Patient Instructions (Signed)
Great to see you!  Come back in 6-8 weeks  Start metformin once daily, if you tolerate it go to twice daily in 2 weeks  Start with 1/2 pill twice daily of glipizide then go up to 1 pill twice daily in 1 week  Diet Recommendations for Diabetes   Starchy (carb) foods include: Bread, rice, pasta, potatoes, corn, crackers, bagels, muffins, all baked goods.   Protein foods include: Meat, fish, poultry, eggs, dairy foods, and beans such as pinto and kidney beans (beans also provide carbohydrate).   1. Eat at least 3 meals and 1-2 snacks per day. Never go more than 4-5 hours while awake without eating.  2. Limit starchy foods to TWO per meal and ONE per snack. ONE portion of a starchy  food is equal to the following:   - ONE slice of bread (or its equivalent, such as half of a hamburger bun).   - 1/2 cup of a "scoopable" starchy food such as potatoes or rice.   - 1 OUNCE (28 grams) of starchy snack foods such as crackers or pretzels (look on label).   - 15 grams of carbohydrate as shown on food label.  3. Both lunch and dinner should include a protein food, a carb food, and vegetables.   - Obtain twice as many veg's as protein or carbohydrate foods for both lunch and dinner.   - Try to keep frozen veg's on hand for a quick vegetable serving.     - Fresh or frozen veg's are best.  4. Breakfast should always include protein.

## 2015-02-04 LAB — CMP14+EGFR
A/G RATIO: 1.6 (ref 1.1–2.5)
ALBUMIN: 4.2 g/dL (ref 3.5–5.5)
ALT: 14 IU/L (ref 0–32)
AST: 13 IU/L (ref 0–40)
Alkaline Phosphatase: 90 IU/L (ref 39–117)
BUN / CREAT RATIO: 15 (ref 9–23)
BUN: 11 mg/dL (ref 6–24)
Bilirubin Total: 0.3 mg/dL (ref 0.0–1.2)
CALCIUM: 8.9 mg/dL (ref 8.7–10.2)
CO2: 24 mmol/L (ref 18–29)
Chloride: 97 mmol/L (ref 97–106)
Creatinine, Ser: 0.71 mg/dL (ref 0.57–1.00)
GFR, EST AFRICAN AMERICAN: 108 mL/min/{1.73_m2} (ref >59)
GFR, EST NON AFRICAN AMERICAN: 94 mL/min/{1.73_m2} (ref >59)
GLOBULIN, TOTAL: 2.6 g/dL (ref 1.5–4.5)
Glucose: 294 mg/dL — ABNORMAL HIGH (ref 65–99)
POTASSIUM: 4.4 mmol/L (ref 3.5–5.2)
SODIUM: 136 mmol/L (ref 136–144)
TOTAL PROTEIN: 6.8 g/dL (ref 6.0–8.5)

## 2015-02-08 ENCOUNTER — Telehealth: Payer: Self-pay | Admitting: Family Medicine

## 2015-02-08 MED ORDER — METFORMIN HCL 500 MG PO TABS: 500.0000 mg | ORAL_TABLET | Freq: Two times a day (BID) | ORAL | Status: DC

## 2015-02-08 NOTE — Telephone Encounter (Signed)
Pt aware.

## 2015-03-22 ENCOUNTER — Ambulatory Visit (INDEPENDENT_AMBULATORY_CARE_PROVIDER_SITE_OTHER): Payer: Self-pay | Admitting: Family Medicine

## 2015-03-22 ENCOUNTER — Encounter: Payer: Self-pay | Admitting: Family Medicine

## 2015-03-22 VITALS — BP 130/80 | HR 68 | Temp 97.0°F | Ht 64.0 in | Wt 211.8 lb

## 2015-03-22 DIAGNOSIS — E119 Type 2 diabetes mellitus without complications: Secondary | ICD-10-CM

## 2015-03-22 DIAGNOSIS — M549 Dorsalgia, unspecified: Secondary | ICD-10-CM

## 2015-03-22 DIAGNOSIS — G8929 Other chronic pain: Secondary | ICD-10-CM

## 2015-03-22 MED ORDER — PRAVASTATIN SODIUM 40 MG PO TABS: 40.0000 mg | ORAL_TABLET | Freq: Every day | ORAL | Status: DC

## 2015-03-22 MED ORDER — OXYCODONE-ACETAMINOPHEN 5-325 MG PO TABS: 1.0000 | ORAL_TABLET | Freq: Three times a day (TID) | ORAL | Status: DC | PRN

## 2015-03-22 NOTE — Progress Notes (Signed)
   HPI  Patient presents today to discuss chronic pain and diabetes.  Patient reports her chronic back pain is stable. She denies saddle anesthesia, our bladder dysfunction, or leg weakness. She previously had numbness down one leg which has improved. She's had better pain improvement with oxycodone as opposed to hydrocodone. She's been using 3-10 oxycodone pills per week, she still has not filled her second prescription from our last visit about 6 weeks ago.   Diabetes Tolerating medication, some loose stools of metformin but states that it's not a big deal, she states that it helps counteract the constipation caused by narcotics Moderate compliance, she states that she gets several times a week.   She would like to try cholesterol medications  PMH: Smoking status noted ROS: Per HPI  Objective: BP 130/80 mmHg  Pulse 68  Temp(Src) 97 F (36.1 C) (Oral)  Ht 5\' 4"  (1.626 m)  Wt 211 lb 12.8 oz (96.072 kg)  BMI 36.34 kg/m2 Gen: NAD, alert, cooperative with exam HEENT: NCAT, EOMI, PERRL CV: RRR, good S1/S2, no murmur Resp: CTABL, no wheezes, non-labored Abd: SNTND, BS present, no guarding or organomegaly Ext: No edema, warm Neuro: Alert and oriented, No gross deficits  Medical exam: 2+ dorsalis pedis pulses, sensation intact to monofilament throughout Yellow thickened/curling toenails throughout, no lesions other than dry skin  Assessment and plan:  # Back pain Pain managed with intermittent narcotics, improved from last visit, using 3-10 pills per week Discussed usual narcotic prescribing system including routine urine drug screens, today I have deferred a urine drug screen due to cost She has a chance of getting insurance this month. No red flags Follow-up 2 months  # Diabetes Uncontrolled, moderate compliance Continue glipizide and metformin Foot exam normal Needs urine microalbumin, however for an due to cost Starting pravastatin due to elevated ASCVD risk based  on diabetes and age greater than 50 deferred lipid panel due to cost, repeat CMP next visit as well as A1c    Meds ordered this encounter  Medications  . oxyCODONE-acetaminophen (ROXICET) 5-325 MG tablet    Sig: Take 1 tablet by mouth every 8 (eight) hours as needed for severe pain.    Dispense:  60 tablet    Refill:  0    Please do not fill before 04/22/2015, she says that she has only filled 1 of the two Rx's given last visit, it is ok to fill the previous rx before this one    Laroy Apple, MD [facility] 03/22/2015, 8:31 AM

## 2015-03-22 NOTE — Patient Instructions (Addendum)
Great to see you!  Lets see you back in 2 months or so  You are due for a mammogram, a diabetic eye exam, and a urine protein test. We are hoping to do these as soon as you get insurance

## 2015-05-18 ENCOUNTER — Other Ambulatory Visit: Payer: Self-pay | Admitting: Family Medicine

## 2015-05-19 NOTE — Telephone Encounter (Signed)
Pt aware & already has an appt for in the morning

## 2015-05-19 NOTE — Telephone Encounter (Signed)
Last seen 03/22/15  Dr Wendi Snipes   Dr Laurance Flatten  PCP   If approved print

## 2015-05-19 NOTE — Telephone Encounter (Signed)
She is getting narcotics from me routinely, however we are only filling theses in an appointnment.   Sorry for her inconvenience but that is our new agreement which I am confident we discussed.   Laroy Apple, MD [facility] 05/19/2015, 12:22 PM

## 2015-05-20 ENCOUNTER — Ambulatory Visit: Payer: Self-pay | Admitting: Family Medicine

## 2015-05-20 ENCOUNTER — Ambulatory Visit (INDEPENDENT_AMBULATORY_CARE_PROVIDER_SITE_OTHER): Payer: Medicaid Other | Admitting: Family Medicine

## 2015-05-20 ENCOUNTER — Encounter: Payer: Self-pay | Admitting: Family Medicine

## 2015-05-20 VITALS — BP 160/75 | HR 71 | Temp 97.4°F | Ht 64.0 in | Wt 208.8 lb

## 2015-05-20 DIAGNOSIS — M549 Dorsalgia, unspecified: Secondary | ICD-10-CM | POA: Diagnosis not present

## 2015-05-20 DIAGNOSIS — E119 Type 2 diabetes mellitus without complications: Secondary | ICD-10-CM | POA: Diagnosis not present

## 2015-05-20 DIAGNOSIS — G8929 Other chronic pain: Secondary | ICD-10-CM | POA: Diagnosis not present

## 2015-05-20 DIAGNOSIS — I10 Essential (primary) hypertension: Secondary | ICD-10-CM | POA: Insufficient documentation

## 2015-05-20 DIAGNOSIS — R030 Elevated blood-pressure reading, without diagnosis of hypertension: Secondary | ICD-10-CM | POA: Diagnosis not present

## 2015-05-20 LAB — POCT GLYCOSYLATED HEMOGLOBIN (HGB A1C): HEMOGLOBIN A1C: 6.6

## 2015-05-20 MED ORDER — METFORMIN HCL 500 MG PO TABS: 500.0000 mg | ORAL_TABLET | Freq: Two times a day (BID) | ORAL | Status: DC

## 2015-05-20 MED ORDER — OXYCODONE-ACETAMINOPHEN 5-325 MG PO TABS: 1.0000 | ORAL_TABLET | Freq: Three times a day (TID) | ORAL | Status: DC | PRN

## 2015-05-20 NOTE — Progress Notes (Signed)
   HPI  Patient presents today here for follow-up of chronic back pain and diabetes.  Elevated blood pressure Does not check routinely at home, no chest pain, dyspnea, palpitations, leg edema.  Back pain Helped by Percocet, this enables her to function more normally. She has left-sided low back pain with left-sided sciatica intermittently. She takes on average 1-2 pills daily. She's had a little bit more use this month due to increased pain.  Diabetes Taking medications inconsistently, missing maybe 2-3 doses a week, at times she misses an entire week. No hypoglycemia No regular checks She's watching her diet minimally. She is walking 10-15 minutes 3 times per week. She does have foot numbness as well not bothersome  PMH: Smoking status noted ROS: Per HPI  Objective: BP 180/75 mmHg  Pulse 71  Temp(Src) 97.4 F (36.3 C) (Oral)  Ht 5\' 4"  (1.626 m)  Wt 208 lb 12.8 oz (94.711 kg)  BMI 35.82 kg/m2 Gen: NAD, alert, cooperative with exam HEENT: NCAT CV: RRR, good S1/S2, no murmur Resp: CTABL, no wheezes, non-labored Ext: No edema, warm Neuro: Alert and oriented, No gross deficits  Assessment and plan:  # Dm2 Control much improved, last A1C was original Dx A1C 8.8-->6.6 Continue metformin and glip, discussed compliance Needs optho, waiting for insurance coverage  # Chronic pain Refilled percocet  # Elevated BP BP log, f/u 1 month NO red flags Likely start lisinopril    Orders Placed This Encounter  Procedures  . Microalbumin / creatinine urine ratio  . POCT glycosylated hemoglobin (Hb A1C)    Meds ordered this encounter  Medications  . oxyCODONE-acetaminophen (ROXICET) 5-325 MG tablet    Sig: Take 1 tablet by mouth every 8 (eight) hours as needed for severe pain.    Dispense:  60 tablet    Refill:  0  . metFORMIN (GLUCOPHAGE) 500 MG tablet    Sig: Take 1 tablet (500 mg total) by mouth 2 (two) times daily with a meal.    Dispense:  180 tablet    Refill:   [place], MD [facility] 05/20/2015, 11:08 AM

## 2015-05-20 NOTE — Patient Instructions (Signed)
Great to see you!  Come back in 1 month with a blood pressure log  Try to take your pills more consistently, you may need a pill box

## 2015-06-20 ENCOUNTER — Encounter: Payer: Self-pay | Admitting: Family Medicine

## 2015-06-20 ENCOUNTER — Ambulatory Visit (INDEPENDENT_AMBULATORY_CARE_PROVIDER_SITE_OTHER): Payer: Medicaid Other | Admitting: Family Medicine

## 2015-06-20 ENCOUNTER — Encounter: Payer: Self-pay | Admitting: *Deleted

## 2015-06-20 VITALS — BP 172/72 | HR 63 | Temp 97.0°F | Ht 64.0 in | Wt 211.0 lb

## 2015-06-20 DIAGNOSIS — R030 Elevated blood-pressure reading, without diagnosis of hypertension: Secondary | ICD-10-CM | POA: Diagnosis not present

## 2015-06-20 DIAGNOSIS — G8929 Other chronic pain: Secondary | ICD-10-CM | POA: Diagnosis not present

## 2015-06-20 DIAGNOSIS — E119 Type 2 diabetes mellitus without complications: Secondary | ICD-10-CM

## 2015-06-20 DIAGNOSIS — M549 Dorsalgia, unspecified: Secondary | ICD-10-CM | POA: Diagnosis not present

## 2015-06-20 MED ORDER — LISINOPRIL 20 MG PO TABS: 20.0000 mg | ORAL_TABLET | Freq: Every day | ORAL | Status: DC

## 2015-06-20 MED ORDER — CYCLOBENZAPRINE HCL 10 MG PO TABS: 10.0000 mg | ORAL_TABLET | Freq: Three times a day (TID) | ORAL | Status: DC | PRN

## 2015-06-20 MED ORDER — OXYCODONE-ACETAMINOPHEN 5-325 MG PO TABS: 1.0000 | ORAL_TABLET | Freq: Three times a day (TID) | ORAL | Status: DC | PRN

## 2015-06-20 NOTE — Patient Instructions (Signed)
Great to see you!  Come back in 1 month for pain and HTN follow up  Start lisinopril, I recommend taking 1/2 pill for 1 week then the whole pill  We will call with labs within 1 week  We will arrange an orthopedic referral and call you about the eye doctor

## 2015-06-20 NOTE — Progress Notes (Signed)
   HPI  Patient presents today all of her diabetes, chronic back pain, and hypertension.  Chronic back pain Long-standing, described as dull achy ateral paraspinal muscle lumbar spine, radiating up into the shoulders as well as down to the buttocks, no leg symptoms. No leg weakness. She does have stable left thigh numbness, she is fitted before from epidural injections, and would like a referral to orthopedic surgery  HTN Good med compliance No chest pain, palps, leg edema, HAs In pain today, feels this ocntributes but open to increased meds, not checking at home   PMH: Smoking status noted ROS: Per HPI  Objective: BP 172/72 mmHg  Pulse 63  Temp(Src) 97 F (36.1 C) (Oral)  Ht '5\' 4"'$  (1.626 m)  Wt 211 lb (95.709 kg)  BMI 36.20 kg/m2 Gen: NAD, alert, cooperative with exam HEENT: NCAT CV: RRR, good S1/S2, no murmur Resp: CTABL, no wheezes, non-labored Ext: No edema, warm Neuro: Alert and oriented, strength 5/5 and sensation intact oin BL LE MSK: Mild tenderness to palp of BL paraspinal muscles of thoracic and lumbar spine  Diabetic Foot Exam - Simple   Simple Foot Form  Visual Inspection  No deformities, no ulcerations, no other skin breakdown bilaterally:  Yes  Sensation Testing  Intact to touch and monofilament testing bilaterally:  Yes  Pulse Check  Posterior Tibialis and Dorsalis pulse intact bilaterally:  Yes  Comments  L second toe with old brusing of nail      Assessment and plan:  # HTN Elevated, adding lsiinopril 10 for 1 week then 20 daily Labs, re-check next month  # T2Dm Last A1C controlled LAbs Optho, foot exam Urine microalbumin  # Chronic back pain Refilled oxycodone Refer to ortho- previously had epidural injections which helped Trial of flexeril    Orders Placed This Encounter  Procedures  . CMP14+EGFR  . Lipid panel  . CBC with Differential/Platelet  . Ambulatory referral to Ophthalmology    Referral Priority:  Routine   Referral Type:  Consultation    Referral Reason:  Specialty Services Required    Requested Specialty:  Ophthalmology    Number of Visits Requested:  1  . Ambulatory referral to Orthopedic Surgery    Referral Priority:  Routine    Referral Type:  Surgical    Referral Reason:  Specialty Services Required    Requested Specialty:  Orthopedic Surgery    Number of Visits Requested:  1    Meds ordered this encounter  Medications  . oxyCODONE-acetaminophen (ROXICET) 5-325 MG tablet    Sig: Take 1 tablet by mouth every 8 (eight) hours as needed for severe pain.    Dispense:  60 tablet    Refill:  0  . lisinopril (PRINIVIL,ZESTRIL) 20 MG tablet    Sig: Take 1 tablet (20 mg total) by mouth daily.    Dispense:  30 tablet    Refill:  3  . cyclobenzaprine (FLEXERIL) 10 MG tablet    Sig: Take 1 tablet (10 mg total) by mouth 3 (three) times daily as needed for muscle spasms.    Dispense:  60 tablet    Refill:  0    Laroy Apple, MD [facility] 06/20/2015, 9:40 AM

## 2015-06-21 LAB — CBC WITH DIFFERENTIAL/PLATELET
BASOS ABS: 0.1 10*3/uL (ref 0.0–0.2)
BASOS: 1 %
EOS (ABSOLUTE): 0.2 10*3/uL (ref 0.0–0.4)
EOS: 2 %
HEMOGLOBIN: 15.6 g/dL (ref 11.1–15.9)
Hematocrit: 45 % (ref 34.0–46.6)
IMMATURE GRANS (ABS): 0 10*3/uL (ref 0.0–0.1)
IMMATURE GRANULOCYTES: 0 %
LYMPHS: 32 %
Lymphocytes Absolute: 3.2 10*3/uL — ABNORMAL HIGH (ref 0.7–3.1)
MCH: 29.7 pg (ref 26.6–33.0)
MCHC: 34.7 g/dL (ref 31.5–35.7)
MCV: 86 fL (ref 79–97)
Monocytes Absolute: 0.7 10*3/uL (ref 0.1–0.9)
Monocytes: 7 %
NEUTROS ABS: 5.8 10*3/uL (ref 1.4–7.0)
Neutrophils: 58 %
PLATELETS: 167 10*3/uL (ref 150–379)
RBC: 5.26 x10E6/uL (ref 3.77–5.28)
RDW: 14.6 % (ref 12.3–15.4)
WBC: 10 10*3/uL (ref 3.4–10.8)

## 2015-06-21 LAB — CMP14+EGFR
ALK PHOS: 82 IU/L (ref 39–117)
ALT: 17 IU/L (ref 0–32)
AST: 15 IU/L (ref 0–40)
Albumin/Globulin Ratio: 2 (ref 1.2–2.2)
Albumin: 4.6 g/dL (ref 3.5–5.5)
BUN/Creatinine Ratio: 20 (ref 9–23)
BUN: 14 mg/dL (ref 6–24)
Bilirubin Total: 0.4 mg/dL (ref 0.0–1.2)
CHLORIDE: 99 mmol/L (ref 96–106)
CO2: 23 mmol/L (ref 18–29)
CREATININE: 0.7 mg/dL (ref 0.57–1.00)
Calcium: 9.1 mg/dL (ref 8.7–10.2)
GFR calc Af Amer: 110 mL/min/{1.73_m2} (ref >59)
GFR calc non Af Amer: 95 mL/min/{1.73_m2} (ref >59)
GLUCOSE: 160 mg/dL — AB (ref 65–99)
Globulin, Total: 2.3 g/dL (ref 1.5–4.5)
Potassium: 4.3 mmol/L (ref 3.5–5.2)
SODIUM: 140 mmol/L (ref 134–144)
Total Protein: 6.9 g/dL (ref 6.0–8.5)

## 2015-06-21 LAB — LIPID PANEL
CHOLESTEROL TOTAL: 171 mg/dL (ref 100–199)
Chol/HDL Ratio: 3 ratio units (ref 0.0–4.4)
HDL: 57 mg/dL (ref >39)
LDL CALC: 88 mg/dL (ref 0–99)
TRIGLYCERIDES: 132 mg/dL (ref 0–149)
VLDL CHOLESTEROL CAL: 26 mg/dL (ref 5–40)

## 2015-06-21 LAB — MICROALBUMIN / CREATININE URINE RATIO
CREATININE, UR: 33.8 mg/dL
MICROALB/CREAT RATIO: 59.8 mg/g{creat} — AB (ref 0.0–30.0)
MICROALBUM., U, RANDOM: 20.2 ug/mL

## 2015-06-23 ENCOUNTER — Encounter: Payer: Self-pay | Admitting: Family Medicine

## 2015-06-23 ENCOUNTER — Ambulatory Visit (INDEPENDENT_AMBULATORY_CARE_PROVIDER_SITE_OTHER): Payer: Medicaid Other | Admitting: Family Medicine

## 2015-06-23 VITALS — BP 136/63 | HR 72 | Temp 97.0°F | Ht 64.0 in | Wt 213.2 lb

## 2015-06-23 DIAGNOSIS — Z124 Encounter for screening for malignant neoplasm of cervix: Secondary | ICD-10-CM | POA: Diagnosis not present

## 2015-06-23 DIAGNOSIS — N393 Stress incontinence (female) (male): Secondary | ICD-10-CM | POA: Diagnosis not present

## 2015-06-23 MED ORDER — ESTRADIOL 10 MCG VA TABS: 10.0000 ug | ORAL_TABLET | VAGINAL | Status: DC

## 2015-06-23 MED ORDER — ESTRADIOL 10 MCG VA TABS: ORAL_TABLET | VAGINAL | Status: DC

## 2015-06-23 NOTE — Addendum Note (Signed)
Addended by: Nigel Berthold C on: 06/23/2015 10:50 AM   Modules accepted: Orders, SmartSet

## 2015-06-23 NOTE — Progress Notes (Signed)
   HPI  Patient presents today here for Pap smear and to discuss stress incontinence.  Patient has not had a Pap smear in several years, no history of abnormal Pap smears.  Had 4 babies via C-section. She notes that she has urine leakage whenever she coughs, labs, or bears down, also occasionally her she first stands up. She has no bladder pressure or spasm symptoms.  She has no worries for sexually transmitted infections. She has no vaginal bleeding, postmenopausal around the age of 50.  He is scheduled to have a mammogram in June  PMH: Smoking status noted ROS: Per HPI  Objective: BP 136/63 mmHg  Pulse 72  Temp(Src) 97 F (36.1 C) (Oral)  Ht 5\' 4"  (1.626 m)  Wt 213 lb 3.2 oz (96.707 kg)  BMI 36.58 kg/m2 Gen: NAD, alert, cooperative with exam HEENT: NCAT CV: RRR, good S1/S2, no murmur Resp: CTABL, no wheezes, non-laboredly Ext: No edema, warm Neuro: Alert and oriented, No gross deficits GU: Normal-appearing female perineum, cervix normal in appearance with no discharge, bimanual exam with no adnexal fullness or cervical motion tenderness  Assessment and plan:  # Pap smear, gynecologic exam Normal-appearing cervix, Pap smear collected  # Stress incontinence Discussed Kegel exercises Given option of seeing a GYN to discuss further treatment such as surgical options Does not appear to have atrophic vaginitis but would like to try vaginal vagifem   HTN Improved with lisinopril   Laroy Apple, MD [facility] 06/23/2015, 10:26 AM

## 2015-06-23 NOTE — Addendum Note (Signed)
Addended by: Timmothy Euler on: 06/23/2015 10:40 AM   Modules accepted: Level of Service, SmartSet

## 2015-06-23 NOTE — Patient Instructions (Signed)
Great to see you!  I am glad you're blood pressure is down  We will call within 1 week with lab results

## 2015-06-29 LAB — PAP IG W/ RFLX HPV ASCU: PAP Smear Comment: 0

## 2015-07-20 ENCOUNTER — Other Ambulatory Visit: Payer: Self-pay | Admitting: Family Medicine

## 2015-07-20 NOTE — Telephone Encounter (Signed)
Last filled 4/3

## 2015-07-21 ENCOUNTER — Ambulatory Visit (INDEPENDENT_AMBULATORY_CARE_PROVIDER_SITE_OTHER): Payer: Medicaid Other | Admitting: Family Medicine

## 2015-07-21 ENCOUNTER — Telehealth: Payer: Self-pay

## 2015-07-21 ENCOUNTER — Encounter: Payer: Self-pay | Admitting: Family Medicine

## 2015-07-21 VITALS — BP 151/75 | HR 74 | Temp 96.9°F | Ht 64.0 in | Wt 207.6 lb

## 2015-07-21 DIAGNOSIS — M549 Dorsalgia, unspecified: Secondary | ICD-10-CM

## 2015-07-21 DIAGNOSIS — G8929 Other chronic pain: Secondary | ICD-10-CM | POA: Diagnosis not present

## 2015-07-21 MED ORDER — MELOXICAM 15 MG PO TABS: 15.0000 mg | ORAL_TABLET | Freq: Every day | ORAL | Status: DC

## 2015-07-21 MED ORDER — OXYCODONE-ACETAMINOPHEN 5-325 MG PO TABS: 1.0000 | ORAL_TABLET | Freq: Three times a day (TID) | ORAL | Status: DC | PRN

## 2015-07-21 NOTE — Telephone Encounter (Signed)
Pt seen today by Dr. Warrick Parisian

## 2015-07-21 NOTE — Telephone Encounter (Signed)
Pt needs to be seen for refills.   Laroy Apple, MD [facility] 07/21/2015, 7:45 AM

## 2015-07-21 NOTE — Telephone Encounter (Signed)
Referral written.  Laroy Apple, MD [facility] 07/21/2015, 11:33 AM

## 2015-07-21 NOTE — Telephone Encounter (Signed)
Wants a referral for ortho to Dr Rolena Infante at [facility]

## 2015-07-21 NOTE — Progress Notes (Signed)
BP 151/75 mmHg  Pulse 74  Temp(Src) 96.9 F (36.1 C) (Oral)  Ht 5\' 4"  (1.626 m)  Wt 207 lb 9.6 oz (94.167 kg)  BMI 35.62 kg/m2   Subjective:    Patient ID: Kimberly Donaldson, female    DOB: 1956/02/11, 60 y.o.   MRN: [id number]  HPI: Kimberly Donaldson is a 60 y.o. female presenting on 07/21/2015 for Back Pain   HPI Back pain Patient is coming in today to lower his chronic low back pain and having more just above her. She felt She made this worse by when she went into the dentist and was in the chair and has been having a lot more pain since. The pain is bilateral but worse on the right than left. She denies any radiation down into her legs. She denies any numbness or weakness in her legs. She denies any loss of bowel or loss of bladder. She's had this previously and takes oxycodone-acetaminophen 4 it.  Relevant past medical, surgical, family and social history reviewed and updated as indicated. Interim medical history since our last visit reviewed. Allergies and medications reviewed and updated.  Review of Systems  Constitutional: Negative for fever and chills.  HENT: Negative for congestion, ear discharge and ear pain.   Eyes: Negative for redness and visual disturbance.  Respiratory: Negative for chest tightness and shortness of breath.   Cardiovascular: Negative for chest pain and leg swelling.  Genitourinary: Negative for dysuria and difficulty urinating.  Musculoskeletal: Positive for myalgias and back pain. Negative for arthralgias and gait problem.  Skin: Negative for rash.  Neurological: Negative for light-headedness and headaches.  Psychiatric/Behavioral: Negative for behavioral problems and agitation.  All other systems reviewed and are negative.   Per HPI unless specifically indicated above     Medication List       This list is accurate as of: 07/21/15  1:04 PM.  Always use your most recent med list.               cyclobenzaprine 10 MG tablet  Commonly known  as:  FLEXERIL  Take 1 tablet (10 mg total) by mouth 3 (three) times daily as needed for muscle spasms.     Estradiol 10 MCG Tabs vaginal tablet  Insert one tablet intravaginally daily for two weeks, followed by twice weekly.     Estradiol 10 MCG Tabs vaginal tablet  Place 1 tablet (10 mcg total) vaginally 2 (two) times a week.     glipiZIDE 10 MG tablet  Commonly known as:  GLUCOTROL  Take 1 tablet (10 mg total) by mouth 2 (two) times daily before a meal.     ibuprofen 200 MG tablet  Commonly known as:  ADVIL,MOTRIN  Take 800 mg by mouth every 6 (six) hours as needed for moderate pain.     lisinopril 20 MG tablet  Commonly known as:  PRINIVIL,ZESTRIL  Take 1 tablet (20 mg total) by mouth daily.     meloxicam 15 MG tablet  Commonly known as:  MOBIC  Take 1 tablet (15 mg total) by mouth daily.     metFORMIN 500 MG tablet  Commonly known as:  GLUCOPHAGE  Take 1 tablet (500 mg total) by mouth 2 (two) times daily with a meal.     oxyCODONE-acetaminophen 5-325 MG tablet  Commonly known as:  ROXICET  Take 1 tablet by mouth every 8 (eight) hours as needed for severe pain.     pravastatin 40 MG tablet  Commonly known  as:  PRAVACHOL  Take 1 tablet (40 mg total) by mouth daily.           Objective:    BP 151/75 mmHg  Pulse 74  Temp(Src) 96.9 F (36.1 C) (Oral)  Ht 5\' 4"  (1.626 m)  Wt 207 lb 9.6 oz (94.167 kg)  BMI 35.62 kg/m2  Wt Readings from Last 3 Encounters:  07/21/15 207 lb 9.6 oz (94.167 kg)  06/23/15 213 lb 3.2 oz (96.707 kg)  06/20/15 211 lb (95.709 kg)    Physical Exam  Constitutional: She is oriented to person, place, and time. She appears well-developed and well-nourished. No distress.  Eyes: Conjunctivae and EOM are normal. Pupils are equal, round, and reactive to light.  Neck: Neck supple. No thyromegaly present.  Cardiovascular: Normal rate, regular rhythm, normal heart sounds and intact distal pulses.   No murmur heard. Pulmonary/Chest: Effort normal  and breath sounds normal. No respiratory distress. She has no wheezes.  Musculoskeletal: Normal range of motion. She exhibits no edema.       Lumbar back: She exhibits tenderness (Bilateral paraspinal tenderness, worse on right than left, negative straight leg raise bilaterally). She exhibits normal range of motion, no bony tenderness, no swelling, no deformity and no laceration.  Lymphadenopathy:    She has no cervical adenopathy.  Neurological: She is alert and oriented to person, place, and time. Coordination normal.  Skin: Skin is warm and dry. No rash noted. She is not diaphoretic.  Psychiatric: She has a normal mood and affect. Her behavior is normal.  Nursing note and vitals reviewed.     Assessment & Plan:   Problem List Items Addressed This Visit      Other   Chronic back pain - Primary   Relevant Medications   oxyCODONE-acetaminophen (ROXICET) 5-325 MG tablet   meloxicam (MOBIC) 15 MG tablet   Other Relevant Orders   Ambulatory referral to Physical Therapy      Follow up plan: Return in about 4 weeks (around 08/18/2015), or if symptoms worsen or fail to improve, for back pain f/u .  Counseling provided for all of the vaccine components Orders Placed This Encounter  Procedures  . Ambulatory referral to Physical Therapy    Caryl Pina, MD [facility] 07/21/2015, 1:04 PM

## 2015-07-21 NOTE — Telephone Encounter (Signed)
Pt aware.

## 2015-08-11 ENCOUNTER — Other Ambulatory Visit: Payer: Self-pay | Admitting: Orthopedic Surgery

## 2015-08-11 DIAGNOSIS — M5442 Lumbago with sciatica, left side: Secondary | ICD-10-CM

## 2015-08-19 ENCOUNTER — Ambulatory Visit (INDEPENDENT_AMBULATORY_CARE_PROVIDER_SITE_OTHER): Payer: Medicaid Other | Admitting: Family Medicine

## 2015-08-19 ENCOUNTER — Encounter: Payer: Self-pay | Admitting: Family Medicine

## 2015-08-19 VITALS — BP 126/69 | HR 65 | Temp 97.5°F | Ht 64.0 in | Wt 209.4 lb

## 2015-08-19 DIAGNOSIS — M549 Dorsalgia, unspecified: Secondary | ICD-10-CM | POA: Diagnosis not present

## 2015-08-19 DIAGNOSIS — Z5189 Encounter for other specified aftercare: Secondary | ICD-10-CM | POA: Diagnosis not present

## 2015-08-19 DIAGNOSIS — R52 Pain, unspecified: Secondary | ICD-10-CM

## 2015-08-19 DIAGNOSIS — G8929 Other chronic pain: Secondary | ICD-10-CM | POA: Diagnosis not present

## 2015-08-19 DIAGNOSIS — I10 Essential (primary) hypertension: Secondary | ICD-10-CM

## 2015-08-19 MED ORDER — OXYCODONE-ACETAMINOPHEN 5-325 MG PO TABS: 1.0000 | ORAL_TABLET | Freq: Three times a day (TID) | ORAL | Status: DC | PRN

## 2015-08-19 NOTE — Progress Notes (Signed)
   HPI  Patient presents today here for follow-up.  Patient explains she has persistence of her chronic low back pain, has not changed. She's had worsening of the pain over the last month and used more pain pills than usual.  She has been on narcotics monthly for the last year. She states that they do help her pain.  She used gabapentin previously for diabetic neuropathy, she does not remember it helping and has discontinued using it. She's willing to try again.  She has been seen [facility] for her back pain as well, she has an MRI scheduled.   PMH: Smoking status noted ROS: Per HPI  Objective: BP 126/69 mmHg  Pulse 65  Temp(Src) 97.5 F (36.4 C) (Oral)  Ht 5\' 4"  (1.626 m)  Wt 209 lb 6.4 oz (94.983 kg)  BMI 35.93 kg/m2 Gen: NAD, alert, cooperative with exam HEENT: NCAT CV: RRR, good S1/S2, no murmur Resp: CTABL, no wheezes, non-labored Ext: No edema, warm Neuro: Alert and oriented, No gross deficits  Diabetic Foot Exam - Simple   Simple Foot Form  Visual Inspection  No deformities, no ulcerations, no other skin breakdown bilaterally:  Yes  Sensation Testing  Intact to touch and monofilament testing bilaterally:  Yes  Pulse Check  Posterior Tibialis and Dorsalis pulse intact bilaterally:  Yes  Comments       Assessment and plan:  # Chronic back pain Refilled oxycodone today Discussed pain contract which she has signed Urine drug screen Follow-up one month, plan to give three-month prescription at the next visit  # Type 2 diabetes Last A1c was reasonable Repeat next visit Discussed ophthalmology Foot exam performed today   # HTN  Well controlled today.  No  change in meds   Meds ordered this encounter  Medications  . oxyCODONE-acetaminophen (ROXICET) 5-325 MG tablet    Sig: Take 1 tablet by mouth every 8 (eight) hours as needed for severe pain.    Dispense:  60 tablet    Refill:  0    Laroy Apple, MD [facility] 08/19/2015, 9:05 AM

## 2015-08-19 NOTE — Patient Instructions (Signed)
Great to see you!  Lets see you again in 1 month, we will discuss diabetes  I am glad you are getting the MRI, please have them send me office notes as you go so I can see where things are going.

## 2015-08-19 NOTE — Addendum Note (Signed)
Addended by: Nigel Berthold C on: 08/19/2015 09:20 AM   Modules accepted: Orders

## 2015-08-22 ENCOUNTER — Ambulatory Visit
Admission: RE | Admit: 2015-08-22 | Discharge: 2015-08-22 | Disposition: A | Discharge status: Home / Self Care | Payer: Medicaid Other | Source: Ambulatory Visit | Attending: Orthopedic Surgery | Admitting: Orthopedic Surgery

## 2015-08-22 DIAGNOSIS — M5442 Lumbago with sciatica, left side: Secondary | ICD-10-CM

## 2015-08-23 ENCOUNTER — Other Ambulatory Visit: Payer: Self-pay

## 2015-08-26 LAB — TOXASSURE SELECT 13 (MW), URINE: PDF: 0

## 2015-08-31 ENCOUNTER — Other Ambulatory Visit: Payer: Self-pay | Admitting: Family Medicine

## 2015-09-12 ENCOUNTER — Encounter: Payer: Medicaid Other | Admitting: *Deleted

## 2015-09-15 LAB — HM DIABETES EYE EXAM

## 2015-09-27 ENCOUNTER — Encounter: Payer: Self-pay | Admitting: Family Medicine

## 2015-09-27 ENCOUNTER — Ambulatory Visit (INDEPENDENT_AMBULATORY_CARE_PROVIDER_SITE_OTHER): Payer: Medicaid Other | Admitting: Family Medicine

## 2015-09-27 VITALS — BP 131/66 | HR 69 | Temp 97.6°F | Ht 64.0 in | Wt 217.2 lb

## 2015-09-27 DIAGNOSIS — G8929 Other chronic pain: Secondary | ICD-10-CM

## 2015-09-27 DIAGNOSIS — I10 Essential (primary) hypertension: Secondary | ICD-10-CM | POA: Diagnosis not present

## 2015-09-27 DIAGNOSIS — M549 Dorsalgia, unspecified: Secondary | ICD-10-CM

## 2015-09-27 DIAGNOSIS — E119 Type 2 diabetes mellitus without complications: Secondary | ICD-10-CM

## 2015-09-27 LAB — BAYER DCA HB A1C WAIVED: HB A1C (BAYER DCA - WAIVED): 7.2 % — ABNORMAL HIGH (ref <7.0)

## 2015-09-27 MED ORDER — OXYCODONE-ACETAMINOPHEN 5-325 MG PO TABS: 1.0000 | ORAL_TABLET | Freq: Three times a day (TID) | ORAL | Status: DC | PRN

## 2015-09-27 MED ORDER — TIZANIDINE HCL 4 MG PO TABS: 4.0000 mg | ORAL_TABLET | Freq: Four times a day (QID) | ORAL | Status: DC | PRN

## 2015-09-27 NOTE — Progress Notes (Addendum)
   HPI  Patient presents today here to follow-up for hypertension, diabetes, and chronic back pain.  Chronic back pain She's had good relief with 20 mg of Flexeril +800 mg of ibuprofen, she's taking this approximately once daily. She has taken much less oxycodone than usual after starting this. Describes bilateral low back pain. She sees an orthopedic surgeon for treatment as well.  Diabetes Has not been able tolerate metformin, cause diarrhea. Tolerating glipizide without hypoglycemia. Watching diet moderately. No regular exercise.  Hypertension Did not take medication this morning, does not check blood pressure at home.   PMH: Smoking status noted ROS: Per HPI  Objective: BP 131/66 mmHg  Pulse 69  Temp(Src) 97.6 F (36.4 C) (Oral)  Ht _0  (1.626 m)  Wt 217 lb 3.2 oz (98.521 kg)  BMI 37.26 kg/m2 Gen: NAD, alert, cooperative with exam HEENT: NCAT CV: RRR, good S1/S2, no murmur Resp: CTABL, no wheezes, non-labored Ext: No edema, warm Neuro: Alert and oriented, No gross deficits  Diabetic Foot Exam - Simple   Simple Foot Form  Visual Inspection  No deformities, no ulcerations, no other skin breakdown bilaterally:  Yes  Sensation Testing  Intact to touch and monofilament testing bilaterally:  Yes  Pulse Check  Posterior Tibialis and Dorsalis pulse intact bilaterally:  Yes  Comments       Assessment and plan:  # Type 2 diabetes A1c slipping, not taking metformin now. No new medications, continue glipizide, emphasized diet role. Follow-up 3 months  # Hypertension Well-controlled today, however did not take her medication this morning.   # Chronic back pain Refilled Percocet 2 months, she's reduced her dose drastically by taking Flexeril and NSAIDs Repeat labs do to increase and NSAIDs I will try tizanidine instead of Flexeril considering that she is taking 20 mg of Flexeril to get improvement. I think it's reasonably safe to take 20 mg of Flexeril if  she has to to reduce the use of opiates, however we will proceed carefully and try other muscle relaxers in the meantime.    Orders Placed This Encounter  Procedures  . Bayer DCA Hb A1c Waived  . CMP14+EGFR  . CBC with Differential    Meds ordered this encounter  Medications  . oxyCODONE-acetaminophen (ROXICET) 5-325 MG tablet    Sig: Take 1 tablet by mouth every 8 (eight) hours as needed for severe pain.    Dispense:  60 tablet    Refill:  0  . oxyCODONE-acetaminophen (ROXICET) 5-325 MG tablet    Sig: Take 1 tablet by mouth every 8 (eight) hours as needed for severe pain.    Dispense:  60 tablet    Refill:  0    please do not fill until 10/28/2015  . tiZANidine (ZANAFLEX) 4 MG tablet    Sig: Take 1 tablet (4 mg total) by mouth every 6 (six) hours as needed for muscle spasms.    Dispense:  60 tablet    Refill:  0    Laroy Apple, MD [facility] 09/27/2015, 9:51 AM

## 2015-09-27 NOTE — Patient Instructions (Signed)
Great to see you!  I am sending tizanidine to see if it works better for you than flexeril ( I would prefer to keep it to no more than 1 pill per dose but I am glad you are getting relief so this is ok if we need to)  Lets plan to see you in 3 months  Diet Recommendations for Diabetes   Starchy (carb) foods include: Bread, rice, pasta, potatoes, corn, crackers, bagels, muffins, all baked goods.   Protein foods include: Meat, fish, poultry, eggs, dairy foods, and beans such as pinto and kidney beans (beans also provide carbohydrate).   1. Eat at least 3 meals and 1-2 snacks per day. Never go more than 4-5 hours while awake without eating.  2. Limit starchy foods to TWO per meal and ONE per snack. ONE portion of a starchy  food is equal to the following:   - ONE slice of bread (or its equivalent, such as half of a hamburger bun).   - 1/2 cup of a "scoopable" starchy food such as potatoes or rice.   - 1 OUNCE (28 grams) of starchy snack foods such as crackers or pretzels (look on label).   - 15 grams of carbohydrate as shown on food label.  3. Both lunch and dinner should include a protein food, a carb food, and vegetables.   - Obtain twice as many veg's as protein or carbohydrate foods for both lunch and dinner.   - Try to keep frozen veg's on hand for a quick vegetable serving.     - Fresh or frozen veg's are best.  4. Breakfast should always include protein.

## 2015-09-28 LAB — CBC WITH DIFFERENTIAL/PLATELET
BASOS: 2 %
Basophils Absolute: 0.2 10*3/uL (ref 0.0–0.2)
EOS (ABSOLUTE): 0.3 10*3/uL (ref 0.0–0.4)
EOS: 3 %
HEMATOCRIT: 45.4 % (ref 34.0–46.6)
HEMOGLOBIN: 14.9 g/dL (ref 11.1–15.9)
Immature Grans (Abs): 0 10*3/uL (ref 0.0–0.1)
Immature Granulocytes: 0 %
LYMPHS ABS: 3.6 10*3/uL — AB (ref 0.7–3.1)
Lymphs: 36 %
MCH: 29.6 pg (ref 26.6–33.0)
MCHC: 32.8 g/dL (ref 31.5–35.7)
MCV: 90 fL (ref 79–97)
MONOCYTES: 8 %
MONOS ABS: 0.8 10*3/uL (ref 0.1–0.9)
NEUTROS ABS: 5.2 10*3/uL (ref 1.4–7.0)
Neutrophils: 51 %
Platelets: 206 10*3/uL (ref 150–379)
RBC: 5.03 x10E6/uL (ref 3.77–5.28)
RDW: 14 % (ref 12.3–15.4)
WBC: 10 10*3/uL (ref 3.4–10.8)

## 2015-09-28 LAB — CMP14+EGFR
ALBUMIN: 4.2 g/dL (ref 3.6–4.8)
ALK PHOS: 83 IU/L (ref 39–117)
ALT: 15 IU/L (ref 0–32)
AST: 12 IU/L (ref 0–40)
Albumin/Globulin Ratio: 1.8 (ref 1.2–2.2)
BUN / CREAT RATIO: 28 (ref 12–28)
BUN: 20 mg/dL (ref 8–27)
Bilirubin Total: 0.3 mg/dL (ref 0.0–1.2)
CO2: 23 mmol/L (ref 18–29)
CREATININE: 0.72 mg/dL (ref 0.57–1.00)
Calcium: 9 mg/dL (ref 8.7–10.3)
Chloride: 99 mmol/L (ref 96–106)
GFR calc non Af Amer: 91 mL/min/{1.73_m2} (ref >59)
GFR, EST AFRICAN AMERICAN: 105 mL/min/{1.73_m2} (ref >59)
GLOBULIN, TOTAL: 2.3 g/dL (ref 1.5–4.5)
Glucose: 181 mg/dL — ABNORMAL HIGH (ref 65–99)
Potassium: 4.9 mmol/L (ref 3.5–5.2)
SODIUM: 140 mmol/L (ref 134–144)
TOTAL PROTEIN: 6.5 g/dL (ref 6.0–8.5)

## 2015-11-02 ENCOUNTER — Other Ambulatory Visit: Payer: Self-pay | Admitting: Family

## 2015-11-17 ENCOUNTER — Ambulatory Visit (INDEPENDENT_AMBULATORY_CARE_PROVIDER_SITE_OTHER): Payer: Medicaid Other | Admitting: Family Medicine

## 2015-11-17 ENCOUNTER — Encounter: Payer: Self-pay | Admitting: Family Medicine

## 2015-11-17 VITALS — BP 142/67 | HR 62 | Temp 96.9°F | Ht 64.0 in | Wt 213.4 lb

## 2015-11-17 DIAGNOSIS — I10 Essential (primary) hypertension: Secondary | ICD-10-CM

## 2015-11-17 DIAGNOSIS — M549 Dorsalgia, unspecified: Secondary | ICD-10-CM

## 2015-11-17 DIAGNOSIS — R531 Weakness: Secondary | ICD-10-CM | POA: Diagnosis not present

## 2015-11-17 DIAGNOSIS — G8929 Other chronic pain: Secondary | ICD-10-CM

## 2015-11-17 NOTE — Progress Notes (Signed)
   HPI  Patient presents today here to discuss episodes of weakness lately.  Patient describes an episode yesterday where she was working outside most of the day, she skipped eating all day, but had clear fluids. She came in and took all of her medications including glipizide, lisinopril, and gabapentin. She states that about 20 minutes later she had acute weakness and felt terrible.  20 minutes before she took the other medication she took 1 Percocet.  He states this has happened before whenever she's taken these medications, however the other times have been after she has eaten.  She did not check her blood sugar during this episode, she did not check her blood pressure.  Feels well today, however she has not taken any medication.  Hypertension No chest pain, dyspnea, palpitations She has good medication compliance normally.  Back pain Doing much better on gabapentin, only taking 1 pill once a day. Now she is only taking oxycodone 1-2 pills per week.   PMH: Smoking status noted ROS: Per HPI  Objective: BP (!) 142/67   Pulse 62   Temp (!) 96.9 F (36.1 C) (Oral)   Ht 5\' 4"  (1.626 m)   Wt 213 lb 6.4 oz (96.8 kg)   BMI 36.63 kg/m  Gen: NAD, alert, cooperative with exam HEENT: NCAT CV: RRR, good S1/S2, no murmur Resp: CTABL, no wheezes, non-labored Ext: No edema, warm Neuro: Alert and oriented, No gross deficits  Assessment and plan:  # Weakness, episodic Likely due to transient hypoglycemia versus hypotension. Patient describes episode after not eating and working out in the yard for a long period time, she's taking glipizide 10 mg twice daily. Recommended checking her blood sugar Episode, and also recommended not taking glipizide if she does not eat.  Also discussed splitting up when she takes her medications- take lisinopril and gabapentin at night.  # Chronic back pain Improving, now needing only intermittent narcotic treatment. Continue gabapentin, discussed  3 times a day dosing if needed  # Hypertension Slightly elevated today, she's not taken her medications Recommended coming into the clinic if her blood sugar is normal and she continues to have these episodes to check a blood pressure during the episode.    Laroy Apple, MD [facility] 11/17/2015, 10:49 AM

## 2015-11-17 NOTE — Patient Instructions (Signed)
Great to see you!  Take lisinopril,  And gabapentin at night  Try to re-start pravastatin  If you have another episode, please check your blood sugar.

## 2015-12-28 ENCOUNTER — Ambulatory Visit: Payer: Medicaid Other | Admitting: Family Medicine

## 2016-02-22 ENCOUNTER — Ambulatory Visit (INDEPENDENT_AMBULATORY_CARE_PROVIDER_SITE_OTHER): Payer: Self-pay | Admitting: Family Medicine

## 2016-02-22 ENCOUNTER — Encounter: Payer: Self-pay | Admitting: Family Medicine

## 2016-02-22 VITALS — BP 163/72 | HR 69 | Temp 97.7°F | Ht 64.0 in | Wt 219.4 lb

## 2016-02-22 DIAGNOSIS — G8929 Other chronic pain: Secondary | ICD-10-CM

## 2016-02-22 DIAGNOSIS — I10 Essential (primary) hypertension: Secondary | ICD-10-CM

## 2016-02-22 DIAGNOSIS — M549 Dorsalgia, unspecified: Secondary | ICD-10-CM

## 2016-02-22 DIAGNOSIS — N952 Postmenopausal atrophic vaginitis: Secondary | ICD-10-CM

## 2016-02-22 DIAGNOSIS — M792 Neuralgia and neuritis, unspecified: Secondary | ICD-10-CM

## 2016-02-22 MED ORDER — OXYCODONE-ACETAMINOPHEN 5-325 MG PO TABS: 1.0000 | ORAL_TABLET | Freq: Three times a day (TID) | ORAL | 0 refills | Status: DC | PRN

## 2016-02-22 NOTE — Patient Instructions (Signed)
Great to see you!  Try gabapentin 3 times daily for the back pain by your shoulder blade  Come bcka in 3 months unless you need [place] sooner

## 2016-02-22 NOTE — Progress Notes (Signed)
   HPI  Patient presents today here to follow-up for chronic pain and a few other complaints.  Vaginal dryness Unclear onset, several months. Recently her husband become interested in sexual activity again, she states she has severe vaginal dryness and discomfort with sex.  She is 18 years postmenopausal, last period was at the age of 88.  Chronic pain Back pain, patient requests refill of Percocet. He has not had a refill since August, this is approximately 4 months. Patient taking intermittently for back pain with good relief.  Neuropathic pain Patient complaining of right-sided thoracic back back pain, at times very sharp and hot feeling, at other times it itchy. She denies any rash or recent illness. This is been going on for 2-3 months. No arm weakness or radiation beyond the area between her thoracic spine and her right shoulder blade.  PMH: Smoking status noted ROS: Per HPI  Objective: BP (!) 163/72   Pulse 69   Temp 97.7 F (36.5 C) (Oral)   Ht 5\' 4"  (1.626 m)   Wt 219 lb 6.4 oz (99.5 kg)   BMI 37.66 kg/m  Gen: NAD, alert, cooperative with exam HEENT: NCAT CV: RRR, good S1/S2, no murmur Resp: CTABL, no wheezes, non-labored Ext: No edema, warm Neuro: Alert and oriented, No gross deficits  Assessment and plan:  # Chronic back pain Stable, controlled with intermittent oxycodone, refill 2 months, #60, 5 mg tablets New neuropathic character in thoracic back see below  # Neuropathic pain Right-sided thoracic back pain with neuropathic nature No rash Discussed restarting gabapentin, patient has some left over at home.  Note that patient has just found out she lost her Medicaid coverage.  # Hypertension Uncontrolled today, I suspect a skin is more nervous than usual given the primary topic of discussion Closely monitor, continue Prinzide  #  Vaginal atrophy Symptoms consistent with atrophic vaginitis, and were gently she has recently lost her insurance  coverage and all the topical creams are too expensive. Discussed using personal lubricant, she will continue to try this.   Meds ordered this encounter  Medications  . oxyCODONE-acetaminophen (ROXICET) 5-325 MG tablet    Sig: Take 1 tablet by mouth every 8 (eight) hours as needed for severe pain.    Dispense:  60 tablet    Refill:  0    please do not fill until at least 30 days after date written  . oxyCODONE-acetaminophen (ROXICET) 5-325 MG tablet    Sig: Take 1 tablet by mouth every 8 (eight) hours as needed for severe pain.    Dispense:  60 tablet    Refill:  0    Laroy Apple, MD [facility] 02/22/2016, 9:56 AM

## 2016-05-21 ENCOUNTER — Other Ambulatory Visit: Payer: Self-pay | Admitting: Family Medicine

## 2016-05-21 ENCOUNTER — Ambulatory Visit: Payer: Self-pay | Admitting: Family Medicine

## 2016-05-21 DIAGNOSIS — M549 Dorsalgia, unspecified: Principal | ICD-10-CM

## 2016-05-21 DIAGNOSIS — G8929 Other chronic pain: Secondary | ICD-10-CM

## 2016-05-21 MED ORDER — OXYCODONE-ACETAMINOPHEN 5-325 MG PO TABS: 1.0000 | ORAL_TABLET | Freq: Three times a day (TID) | ORAL | 0 refills | Status: DC | PRN

## 2016-05-21 NOTE — Telephone Encounter (Signed)
Prescription refilled outside of an appointment due to the provider, me, having to reschedule appointment leaving due to illness.  [place] controlled substance database reviewed with no red flags.  Patient is well-known patient to me, seen with her husband last week. She can also reschedule her appointment, however her prescription will be placed upfront for her to pick up.  Laroy Apple, MD [facility] 05/21/2016, 11:54 AM

## 2016-05-31 ENCOUNTER — Ambulatory Visit: Payer: Self-pay | Admitting: Family Medicine

## 2017-01-10 ENCOUNTER — Ambulatory Visit (INDEPENDENT_AMBULATORY_CARE_PROVIDER_SITE_OTHER): Payer: Self-pay | Admitting: Family Medicine

## 2017-01-10 ENCOUNTER — Encounter: Payer: Self-pay | Admitting: Family Medicine

## 2017-01-10 VITALS — BP 167/70 | HR 72 | Temp 97.5°F | Ht 64.0 in | Wt 218.0 lb

## 2017-01-10 DIAGNOSIS — M75102 Unspecified rotator cuff tear or rupture of left shoulder, not specified as traumatic: Secondary | ICD-10-CM

## 2017-01-10 DIAGNOSIS — L989 Disorder of the skin and subcutaneous tissue, unspecified: Secondary | ICD-10-CM

## 2017-01-10 DIAGNOSIS — M549 Dorsalgia, unspecified: Secondary | ICD-10-CM

## 2017-01-10 DIAGNOSIS — G8929 Other chronic pain: Secondary | ICD-10-CM

## 2017-01-10 DIAGNOSIS — I10 Essential (primary) hypertension: Secondary | ICD-10-CM

## 2017-01-10 MED ORDER — OXYCODONE-ACETAMINOPHEN 5-325 MG PO TABS: 1.0000 | ORAL_TABLET | Freq: Three times a day (TID) | ORAL | 0 refills | Status: DC | PRN

## 2017-01-10 NOTE — Progress Notes (Signed)
   HPI  Patient presents today with left shoulder pain.  Patient states that about 2 months ago she fell off of her lawnmower to the side landing on all fours.  She caused left shoulder pain that just has not resolved since that time. Her husband died a few months ago and since that time her low back pain is actually been improved. The patient has full range of motion of left shoulder with persistent aching. She requests refill of narcotic.  Type 2 diabetes Patient not really taking medications currently, not watching diet. She is self-pay and does not want to do labs today.  Hypertension. No medication recently No headache or chest pain.  Heel lesion Present for about 3 days, started as a blister and has now opened up.   PMH: Smoking status noted ROS: Per HPI  Objective: BP (!) 167/70   Pulse 72   Temp (!) 97.5 F (36.4 C) (Oral)   Ht 5\' 4"  (1.626 m)   Wt 218 lb (98.9 kg)   BMI 37.42 kg/m  Gen: NAD, alert, cooperative with exam HEENT: NCAT CV: RRR, good S1/S2, no murmur Resp: CTABL, no wheezes, non-labored Ext: No edema, warm Neuro: Alert and oriented, No gross deficits Skin Right heel with 1.  7 x 1.3 cm shallow lesion with sharp borders, no purulent drainage, warmth, or tenderness to palpation Msk: Reasonable range of motion in the left, strength 5/5 in bilateral upper extremities Positive Hawkins and empty can test. Mild tenderness to palpation over the supraspinatus  Assessment and plan:  #Rotator cuff syndrome As usual course of illness Refill narcotic. Preserved strength and range of motion, hopefully will not need any sort  #Hypertension Elevated, patient is not taking medications Restart meds, recommend labs but she is hesitant given self-pay   #Heel lesion No signs of acute infection Discussed supportive care and routine healing Discussed the seriousness of this with type 2 diabetes that is likely uncontrolled   Meds ordered this encounter    Medications  . oxyCODONE-acetaminophen (ROXICET) 5-325 MG tablet    Sig: Take 1 tablet by mouth every 8 (eight) hours as needed for severe pain.    Dispense:  60 tablet    Refill:  0    Laroy Apple, MD [facility] 01/10/2017, 5:14 PM

## 2017-01-10 NOTE — Progress Notes (Signed)
a 

## 2017-01-10 NOTE — Patient Instructions (Signed)
Great to see you!  Come back in 1-2 months for follow up of chronic medical problems.

## 2017-01-31 ENCOUNTER — Encounter: Payer: Self-pay | Admitting: Pediatrics

## 2017-01-31 ENCOUNTER — Ambulatory Visit: Payer: Self-pay | Admitting: Family Medicine

## 2017-01-31 ENCOUNTER — Ambulatory Visit (INDEPENDENT_AMBULATORY_CARE_PROVIDER_SITE_OTHER): Payer: Self-pay | Admitting: Pediatrics

## 2017-01-31 VITALS — BP 133/68 | HR 71 | Temp 96.9°F | Ht 64.0 in | Wt 217.6 lb

## 2017-01-31 DIAGNOSIS — L97509 Non-pressure chronic ulcer of other part of unspecified foot with unspecified severity: Secondary | ICD-10-CM | POA: Insufficient documentation

## 2017-01-31 DIAGNOSIS — L97511 Non-pressure chronic ulcer of other part of right foot limited to breakdown of skin: Secondary | ICD-10-CM

## 2017-01-31 DIAGNOSIS — N3090 Cystitis, unspecified without hematuria: Secondary | ICD-10-CM

## 2017-01-31 DIAGNOSIS — R399 Unspecified symptoms and signs involving the genitourinary system: Secondary | ICD-10-CM

## 2017-01-31 LAB — URINALYSIS, COMPLETE
BILIRUBIN UA: NEGATIVE
NITRITE UA: POSITIVE — AB
PH UA: 6 (ref 5.0–7.5)
Specific Gravity, UA: 1.015 (ref 1.005–1.030)
UUROB: 0.2 mg/dL (ref 0.2–1.0)

## 2017-01-31 LAB — MICROSCOPIC EXAMINATION
RENAL EPITHEL UA: NONE SEEN /HPF
WBC, UA: >30 /hpf — AB (ref 0–?)

## 2017-01-31 MED ORDER — CEPHALEXIN 500 MG PO CAPS: 500.0000 mg | ORAL_CAPSULE | Freq: Four times a day (QID) | ORAL | 0 refills | Status: AC

## 2017-01-31 MED ORDER — SULFAMETHOXAZOLE-TRIMETHOPRIM 800-160 MG PO TABS: 1.0000 | ORAL_TABLET | Freq: Two times a day (BID) | ORAL | 0 refills | Status: DC

## 2017-01-31 NOTE — Progress Notes (Signed)
  Subjective:   Patient ID: Kimberly Donaldson, female    DOB: 02-17-1956, 61 y.o.   MRN: [id number] CC: Hematuria and Painful urination  HPI: Kimberly Donaldson is a 61 y.o. female presenting for Hematuria and Painful urination  Started with burning at end of urination 1 week ago Pink tinged urine starting yesterday More pain now with urination  Blister on back of R foot has been there for the past 3 weeks Not improving No discharge, mildly tender Started after wearing tight shoes for 2 hours History of diabetes, hoping to get insurance soon Trying to avoid wearing shoes with backs on them for now No fevers    Relevant past medical, surgical, family and social history reviewed. Allergies and medications reviewed and updated. Social History   Tobacco Use  Smoking Status Current Every Day Smoker  . Packs/day: 1.00   ROS: Per HPI   Objective:    BP 133/68   Pulse 71   Temp (!) 96.9 F (36.1 C) (Oral)   Ht 5\' 4"  (1.626 m)   Wt 217 lb 9.6 oz (98.7 kg)   BMI 37.35 kg/m   Wt Readings from Last 3 Encounters:  01/31/17 217 lb 9.6 oz (98.7 kg)  01/10/17 218 lb (98.9 kg)  02/22/16 219 lb 6.4 oz (99.5 kg)    Gen: NAD, alert, cooperative with exam, NCAT EYES: EOMI, no conjunctival injection, or no icterus ENT:  OP without erythema LYMPH: no cervical LAD CV: NRRR, normal S1/S2, no murmur, distal pulses 2+ b/l Resp: CTABL, no wheezes, normal WOB Abd: +BS, soft, NTND. no guarding or organomegaly, no CVA tenderness Ext: No edema, warm Neuro: Alert and oriented, strength equal b/l UE and LE, coordination grossly normal Skin: apprx 1cm erosion on back of right heel with thickened skin, some yellow crusting No surrounding erythema Mild tenderness around the region  Assessment & Plan:  [name] was seen today for hematuria and painful urination.  Diagnoses and all orders for this visit:  UTI symptoms -     Urine Culture; Future -     Urinalysis, Complete  Skin ulcer of right  foot, limited to breakdown of skin ([place]) Recommend evaluation by podiatry for debridement Patient wants to defer until she gets insurance which she is hoping as soon We will treat with below antibiotics for mild foot infection given tenderness -     cephALEXin (KEFLEX) 500 MG capsule; Take 1 capsule (500 mg total) 4 (four) times daily for 7 days by mouth. -     sulfamethoxazole-trimethoprim (BACTRIM DS) 800-160 MG tablet; Take 1 tablet 2 (two) times daily by mouth.  Cystitis Positive UA  we will follow-up culture Treated with antibiotics as above -     Urine Culture   Follow up plan: 1 month, sooner if needed return precautions discussed Assunta Found, MD [place]

## 2017-02-02 LAB — URINE CULTURE

## 2017-05-28 ENCOUNTER — Ambulatory Visit (INDEPENDENT_AMBULATORY_CARE_PROVIDER_SITE_OTHER): Payer: Self-pay | Admitting: Family Medicine

## 2017-05-28 ENCOUNTER — Encounter: Payer: Self-pay | Admitting: Family Medicine

## 2017-05-28 VITALS — BP 171/72 | HR 62 | Temp 97.0°F | Ht 64.0 in | Wt 215.4 lb

## 2017-05-28 DIAGNOSIS — I10 Essential (primary) hypertension: Secondary | ICD-10-CM

## 2017-05-28 DIAGNOSIS — G8929 Other chronic pain: Secondary | ICD-10-CM

## 2017-05-28 DIAGNOSIS — M549 Dorsalgia, unspecified: Secondary | ICD-10-CM

## 2017-05-28 DIAGNOSIS — E119 Type 2 diabetes mellitus without complications: Secondary | ICD-10-CM

## 2017-05-28 MED ORDER — OXYCODONE-ACETAMINOPHEN 5-325 MG PO TABS: 1.0000 | ORAL_TABLET | Freq: Three times a day (TID) | ORAL | 0 refills | Status: DC | PRN

## 2017-05-28 NOTE — Patient Instructions (Signed)
Great to see you!   

## 2017-05-28 NOTE — Progress Notes (Signed)
   HPI  Patient presents today here for follow-up.  Patient has pain in the left hand at the palmar side of the third MTP, she previously had a trigger finger which has resolved but now she has pain in that area. Denies injury.  Patient takes Percocet as needed for lower back pain.  She is used 60 pills in the last 4-5 months. She denies any other drug use. We have discussed a pain management contract which is on file.  Hypertension Patient has not been taking lisinopril-she will restart. No headache or chest pain   PMH: Smoking status noted ROS: Per HPI  Objective: BP (!) 171/72   Pulse 62   Temp (!) 97 F (36.1 C) (Oral)   Ht '5\' 4"'$  (1.626 m)   Wt 215 lb 6.4 oz (97.7 kg)   BMI 36.97 kg/m  Gen: NAD, alert, cooperative with exam HEENT: NCAT CV: RRR, good S1/S2, no murmur Resp: CTABL, no wheezes, non-labored Ext: No edema, warm Neuro: Alert and oriented, No gross deficits  Assessment and plan:  #Chronic back pain Pain management contract signed, [place] controlled substance database reviewed with no red flags, we discussed the dental Rx's Refilled X 60 pills.   # HTN Restart meds, uncontrolled  # T2Dm A1C, She will have to return when she can pay for it Glipizide may be necessary, she does not tolerate Metformin CMP    Orders Placed This Encounter  Procedures  . CMP14+EGFR  . Bayer DCA Hb A1c Waived    Meds ordered this encounter  Medications  . oxyCODONE-acetaminophen (ROXICET) 5-325 MG tablet    Sig: Take 1 tablet by mouth every 8 (eight) hours as needed for severe pain.    Dispense:  60 tablet    Refill:  0    Laroy Apple, MD [facility] 05/28/2017, 9:20 AM

## 2017-08-08 ENCOUNTER — Ambulatory Visit (INDEPENDENT_AMBULATORY_CARE_PROVIDER_SITE_OTHER): Payer: Self-pay | Admitting: Family Medicine

## 2017-08-08 ENCOUNTER — Encounter: Payer: Self-pay | Admitting: Family Medicine

## 2017-08-08 VITALS — BP 175/71 | HR 65 | Temp 97.3°F | Ht 64.0 in | Wt 213.0 lb

## 2017-08-08 DIAGNOSIS — M549 Dorsalgia, unspecified: Secondary | ICD-10-CM

## 2017-08-08 DIAGNOSIS — E119 Type 2 diabetes mellitus without complications: Secondary | ICD-10-CM

## 2017-08-08 DIAGNOSIS — I10 Essential (primary) hypertension: Secondary | ICD-10-CM

## 2017-08-08 DIAGNOSIS — G8929 Other chronic pain: Secondary | ICD-10-CM

## 2017-08-08 MED ORDER — LISINOPRIL 20 MG PO TABS: 20.0000 mg | ORAL_TABLET | Freq: Every day | ORAL | 3 refills | Status: DC

## 2017-08-08 MED ORDER — OXYCODONE-ACETAMINOPHEN 5-325 MG PO TABS: 1.0000 | ORAL_TABLET | Freq: Three times a day (TID) | ORAL | 0 refills | Status: DC | PRN

## 2017-08-08 NOTE — Progress Notes (Signed)
   HPI   Uninsured patient  Patient presents today here for follow-up chronic pain.  Patient has chronic back pain, she is oxycodone intermittently for severe pain.  She has not had a refill in about 2 months and still has 3 to 4 pills left.  Patient is self-pay.  She has stopped taking her blood pressure medication without cause. We discussed the importance of the medication. No headache or chest pain.  Patient also is no longer taking any diabetic medications. She states she is watching her diet very closely.  PMH: Smoking status noted ROS: Per HPI  Objective: BP (!) 175/71   Pulse 65   Temp (!) 97.3 F (36.3 C) (Oral)   Ht 5\' 4"  (1.626 m)   Wt 213 lb (96.6 kg)   BMI 36.56 kg/m  Gen: NAD, alert, cooperative with exam HEENT: NCAT CV: RRR, good S1/S2, no murmur Resp: CTABL, no wheezes, non-labored Ext: No edema, warm Neuro: Alert and oriented, No gross deficits  Assessment and plan:  #Chronic back pain Patient using oxycodone intermittently, she is self-pay so we have not done urine drug screens like usual.  There are no red flags [place] controlled substance database reviewed with no red flags  #Hypertension Uncontrolled Restart lisinopril Recommended labs next visit, CMP and A1c   #Type 2 diabetes Recommend labs next visit, she will consider financial repercussions Discussed diet She previously did well with glipizide   Meds ordered this encounter  Medications  . lisinopril (PRINIVIL,ZESTRIL) 20 MG tablet    Sig: Take 1 tablet (20 mg total) by mouth daily.    Dispense:  30 tablet    Refill:  3  . oxyCODONE-acetaminophen (ROXICET) 5-325 MG tablet    Sig: Take 1 tablet by mouth every 8 (eight) hours as needed for severe pain.    Dispense:  60 tablet    Refill:  0    Laroy Apple, MD [facility] 08/08/2017, 8:49 AM

## 2017-08-08 NOTE — Patient Instructions (Signed)
Great to see you!   

## 2017-08-28 ENCOUNTER — Encounter: Payer: Self-pay | Admitting: Family Medicine

## 2017-08-28 ENCOUNTER — Ambulatory Visit (INDEPENDENT_AMBULATORY_CARE_PROVIDER_SITE_OTHER): Payer: Self-pay | Admitting: Family Medicine

## 2017-08-28 VITALS — BP 165/74 | HR 64 | Temp 97.5°F | Ht 64.0 in | Wt 213.4 lb

## 2017-08-28 DIAGNOSIS — Z720 Tobacco use: Secondary | ICD-10-CM

## 2017-08-28 DIAGNOSIS — L03114 Cellulitis of left upper limb: Secondary | ICD-10-CM

## 2017-08-28 MED ORDER — VARENICLINE TARTRATE 0.5 MG X 11 & 1 MG X 42 PO MISC: ORAL | 0 refills | Status: DC

## 2017-08-28 MED ORDER — CEPHALEXIN 500 MG PO CAPS: 500.0000 mg | ORAL_CAPSULE | Freq: Four times a day (QID) | ORAL | 0 refills | Status: DC

## 2017-08-28 MED ORDER — VARENICLINE TARTRATE 1 MG PO TABS: 1.0000 mg | ORAL_TABLET | Freq: Two times a day (BID) | ORAL | 2 refills | Status: DC

## 2017-08-28 NOTE — Progress Notes (Signed)
   HPI  Patient presents today for painful rash  Self pay pt  Like to stop smoking, her family is going to buy Chantix for her if I think it is appropriate. She would like to quit smoking. No depression or suicidal thoughts.  Patient has a rash that started 3 days ago in her left elbow, is getting worse, she had red streaking overnight.  She states that it itching and burning and painful. She denies fever, chills, sweats.  PMH: Smoking status noted ROS: Per HPI  Objective: BP (!) 165/74   Pulse 64   Temp (!) 97.5 F (36.4 C) (Oral)   Ht 5\' 4"  (1.626 m)   Wt 213 lb 6.4 oz (96.8 kg)   BMI 36.63 kg/m  Gen: NAD, alert, cooperative with exam HEENT: NCAT, EOMI, PERRL CV: RRR, good S1/S2, no murmur Resp: CTABL, no wheezes, non-labored Ext: No edema, warm Neuro: Alert and oriented, No gross deficits  Skin Left elbow with 1.9 cm x 2 cm erythematous central area surrounded by erythema that is 14 cm x 14 cm with some streaks heading up the left anterior arm. No fluctuance or drainage  Assessment and plan:  #Cellulitis Treat with Keflex Discussed usual course of healing Discussed reasons to seek emergency medical care  #Tobacco abuse Chantix prescribed, discussed multiple ways of using Chantix, she will read on the website.     Meds ordered this encounter  Medications  . cephALEXin (KEFLEX) 500 MG capsule    Sig: Take 1 capsule (500 mg total) by mouth 4 (four) times daily.    Dispense:  28 capsule    Refill:  0  . varenicline (CHANTIX STARTING MONTH PAK) 0.5 MG X 11 & 1 MG X 42 tablet    Sig: Take one 0.5 mg tablet by mouth once daily for 3 days, then increase to one 0.5 mg tablet twice daily for 4 days, then increase to one 1 mg tablet twice daily.    Dispense:  53 tablet    Refill:  0  . varenicline (CHANTIX CONTINUING MONTH PAK) 1 MG tablet    Sig: Take 1 tablet (1 mg total) by mouth 2 (two) times daily.    Dispense:  60 tablet    Refill:  [place],  MD [facility] 08/28/2017, 9:25 AM

## 2017-08-28 NOTE — Patient Instructions (Signed)

## 2017-09-09 ENCOUNTER — Encounter: Payer: Self-pay | Admitting: Gastroenterology

## 2017-11-08 ENCOUNTER — Ambulatory Visit: Payer: Self-pay | Admitting: Family Medicine

## 2018-02-05 ENCOUNTER — Ambulatory Visit (INDEPENDENT_AMBULATORY_CARE_PROVIDER_SITE_OTHER): Payer: Self-pay | Admitting: Family Medicine

## 2018-02-05 ENCOUNTER — Encounter: Payer: Self-pay | Admitting: Family Medicine

## 2018-02-05 VITALS — BP 132/60 | HR 73 | Temp 97.0°F | Ht 64.0 in | Wt 209.2 lb

## 2018-02-05 DIAGNOSIS — N3000 Acute cystitis without hematuria: Secondary | ICD-10-CM

## 2018-02-05 DIAGNOSIS — G8929 Other chronic pain: Secondary | ICD-10-CM

## 2018-02-05 DIAGNOSIS — M549 Dorsalgia, unspecified: Secondary | ICD-10-CM

## 2018-02-05 MED ORDER — CEPHALEXIN 500 MG PO CAPS: 500.0000 mg | ORAL_CAPSULE | Freq: Four times a day (QID) | ORAL | 0 refills | Status: DC

## 2018-02-05 MED ORDER — OXYCODONE-ACETAMINOPHEN 5-325 MG PO TABS: 1.0000 | ORAL_TABLET | Freq: Three times a day (TID) | ORAL | 0 refills | Status: DC | PRN

## 2018-02-05 NOTE — Progress Notes (Signed)
BP 132/60   Pulse 73   Temp (!) 97 F (36.1 C) (Oral)   Ht 5\' 4"  (1.626 m)   Wt 209 lb 3.2 oz (94.9 kg)   BMI 35.91 kg/m    Subjective:    Patient ID: Kimberly Donaldson, female    DOB: 05/22/1955, 62 y.o.   MRN: [id number]  HPI: Kimberly Donaldson is a 62 y.o. female presenting on 02/05/2018 for Urinary Frequency (x 2-3 days) and Medication Refill (percocet)   HPI Urinary frequency and irritation Patient comes in with urinary frequency and irritation is been going on for 2 to 3 days.  She says this is usually how her urinary tract infection start.  She has been trying to increase her fluid intake to see if she can clear it on her own but has not been working this point and she feels like it is getting worse.  She denies any fevers or chills or flank pain to this point but she feels like it is heading in that direction.  She denies any vaginal discharge or vaginal complaints.  Patient has been taking the occasional Percocet for chronic back pain and arthritis and her last refill was in May 2019 6 months ago and she got 60 tablets and they last her about that long.  She is just trying to see if she can get a refill for that today.  She still does try to stick mostly with Tylenol and ibuprofen and uses it only occasionally.  Relevant past medical, surgical, family and social history reviewed and updated as indicated. Interim medical history since our last visit reviewed. Allergies and medications reviewed and updated.  Review of Systems  Constitutional: Negative for chills and fever.  Eyes: Negative for visual disturbance.  Respiratory: Negative for chest tightness and shortness of breath.   Cardiovascular: Negative for chest pain and leg swelling.  Gastrointestinal: Negative for abdominal pain.  Genitourinary: Positive for frequency. Negative for difficulty urinating, dysuria, flank pain, hematuria, vaginal bleeding, vaginal discharge and vaginal pain.  Musculoskeletal: Negative for back  pain and gait problem.  Skin: Negative for rash.  Neurological: Negative for light-headedness and headaches.  Psychiatric/Behavioral: Negative for agitation and behavioral problems.  All other systems reviewed and are negative.   Per HPI unless specifically indicated above   Allergies as of 02/05/2018      Reactions   Codeine    REACTION: ringing in ears   Morphine    REACTION: hives   Other    sporinof - for nail fungus      Medication List        Accurate as of 02/05/18 11:02 AM. Always use your most recent med list.          cephALEXin 500 MG capsule Commonly known as:  KEFLEX Take 1 capsule (500 mg total) by mouth 4 (four) times daily.   cyclobenzaprine 10 MG tablet Commonly known as:  FLEXERIL TAKE 1 TABLET THREE TIMES DAILY AS NEEDED FOR MUSCLE SPASM   ibuprofen 200 MG tablet Commonly known as:  ADVIL,MOTRIN Take 800 mg by mouth every 6 (six) hours as needed for moderate pain.   lisinopril 20 MG tablet Commonly known as:  PRINIVIL,ZESTRIL Take 1 tablet (20 mg total) by mouth daily.   oxyCODONE-acetaminophen 5-325 MG tablet Commonly known as:  PERCOCET/ROXICET Take 1 tablet by mouth every 8 (eight) hours as needed for severe pain.          Objective:    BP 132/60  Pulse 73   Temp (!) 97 F (36.1 C) (Oral)   Ht 5\' 4"  (1.626 m)   Wt 209 lb 3.2 oz (94.9 kg)   BMI 35.91 kg/m   Wt Readings from Last 3 Encounters:  02/05/18 209 lb 3.2 oz (94.9 kg)  08/28/17 213 lb 6.4 oz (96.8 kg)  08/08/17 213 lb (96.6 kg)    Physical Exam  Constitutional: She is oriented to person, place, and time. She appears well-developed and well-nourished. No distress.  Eyes: Conjunctivae are normal.  Abdominal: Soft. Bowel sounds are normal. She exhibits no distension. There is no tenderness.  Neurological: She is alert and oriented to person, place, and time. Coordination normal.  Skin: Skin is warm and dry. No rash noted. She is not diaphoretic.  Psychiatric: She has  a normal mood and affect. Her behavior is normal.  Nursing note and vitals reviewed.       Assessment & Plan:   Problem List Items Addressed This Visit      Other   Chronic back pain   Relevant Medications   oxyCODONE-acetaminophen (ROXICET) 5-325 MG tablet    Other Visit Diagnoses    Acute cystitis without hematuria    -  Primary   Relevant Medications   cephALEXin (KEFLEX) 500 MG capsule    Will treat symptomatically because patient is self-pay with Keflex.  Follow up plan: Return if symptoms worsen or fail to improve.  Counseling provided for all of the vaccine components No orders of the defined types were placed in this encounter.   Caryl Pina, MD [facility] 02/05/2018, 11:02 AM

## 2018-07-31 ENCOUNTER — Other Ambulatory Visit: Payer: Self-pay

## 2018-07-31 ENCOUNTER — Encounter: Payer: Self-pay | Admitting: Family Medicine

## 2018-07-31 ENCOUNTER — Ambulatory Visit (INDEPENDENT_AMBULATORY_CARE_PROVIDER_SITE_OTHER): Payer: Self-pay | Admitting: Family Medicine

## 2018-07-31 DIAGNOSIS — E119 Type 2 diabetes mellitus without complications: Secondary | ICD-10-CM

## 2018-07-31 DIAGNOSIS — G8929 Other chronic pain: Secondary | ICD-10-CM

## 2018-07-31 DIAGNOSIS — M549 Dorsalgia, unspecified: Secondary | ICD-10-CM

## 2018-07-31 DIAGNOSIS — I10 Essential (primary) hypertension: Secondary | ICD-10-CM

## 2018-07-31 MED ORDER — OXYCODONE-ACETAMINOPHEN 5-325 MG PO TABS: 1.0000 | ORAL_TABLET | Freq: Three times a day (TID) | ORAL | 0 refills | Status: DC | PRN

## 2018-07-31 MED ORDER — LISINOPRIL 20 MG PO TABS: 20.0000 mg | ORAL_TABLET | Freq: Every day | ORAL | 3 refills | Status: DC

## 2018-07-31 NOTE — Progress Notes (Signed)
Virtual Visit via telephone Note  I connected with Kimberly Donaldson on 07/31/18 at 1601 by telephone and verified that I am speaking with the correct person using two identifiers. Kimberly Donaldson is currently located at home and no other people are currently with her during visit. The provider, Fransisca Kaufmann Oumar Marcott, MD is located in their office at time of visit.  Call ended at 1610  I discussed the limitations, risks, security and privacy concerns of performing an evaluation and management service by telephone and the availability of in person appointments. I also discussed with the patient that there may be a patient responsible charge related to this service. The patient expressed understanding and agreed to proceed.   History and Present Illness: Type 2 diabetes mellitus Patient comes in today for recheck of his diabetes. Patient has been currently taking no medication for diabetes and has been diet controlled, her sugars have been at the highest 120 in the afternoons after she eats.. Patient is currently on an ACE inhibitor/ARB. Patient has not seen an ophthalmologist this year. Patient denies any issues with their feet.   Hypertension Patient is currently on lisinopril, and their blood pressure today is unknown because she does not have a way to check it but she will see if she can go up on the way to get it checked. Patient denies any lightheadedness or dizziness. Patient denies headaches, blurred vision, chest pains, shortness of breath, or weakness. Denies any side effects from medication and is content with current medication.   Pain assessment: Cause of pain- low back pain, degenerative Pain location- back pain Pain on scale of 1-10- 2 Frequency- every few weeks flares up What increases pain-certain movements What makes pain Better-medication and stretching Effects on ADL - none Any change in general medical condition-none  Current opioids rx- percocet 5-3 25 every 8 hours as  needed # meds rx-60 Effectiveness of current meds-work well, she only uses it every now and then in the last time she had a refill for 60 was in November Adverse reactions form pain meds-none Morphine equivalent- 22.5  Pill count performed-No Last drug screen -June 2017 ( high risk q76m, moderate risk q75m, low risk yearly ) Urine drug screen today- No Was the [place] reviewed-yes  If yes were their any concerning findings? -None  No flowsheet data found.   Pain contract signed on:   No diagnosis found.  Outpatient Encounter Medications as of 07/31/2018  Medication Sig  . cephALEXin (KEFLEX) 500 MG capsule Take 1 capsule (500 mg total) by mouth 4 (four) times daily.  . cyclobenzaprine (FLEXERIL) 10 MG tablet TAKE 1 TABLET THREE TIMES DAILY AS NEEDED FOR MUSCLE SPASM  . ibuprofen (ADVIL,MOTRIN) 200 MG tablet Take 800 mg by mouth every 6 (six) hours as needed for moderate pain.  [name] lisinopril (PRINIVIL,ZESTRIL) 20 MG tablet Take 1 tablet (20 mg total) by mouth daily.  [name] oxyCODONE-acetaminophen (ROXICET) 5-325 MG tablet Take 1 tablet by mouth every 8 (eight) hours as needed for severe pain.   No facility-administered encounter medications on file as of 07/31/2018.     Review of Systems  Constitutional: Negative for chills and fever.  Eyes: Negative for redness and visual disturbance.  Respiratory: Negative for chest tightness and shortness of breath.   Cardiovascular: Negative for chest pain and leg swelling.  Musculoskeletal: Positive for back pain (Intermittent). Negative for gait problem.  Skin: Negative for rash.  Neurological: Negative for dizziness, light-headedness and headaches.  Psychiatric/Behavioral: Negative for  agitation and behavioral problems.  All other systems reviewed and are negative.   Observations/Objective: Patient sounds comfortable and in no acute distress  Assessment and Plan: Problem List Items Addressed This Visit      Cardiovascular and Mediastinum    HTN (hypertension) - Primary   Relevant Medications   lisinopril (ZESTRIL) 20 MG tablet     Endocrine   DM2 (diabetes mellitus, type 2) (HCC)   Relevant Medications   lisinopril (ZESTRIL) 20 MG tablet     Other   Chronic back pain   Relevant Medications   oxyCODONE-acetaminophen (ROXICET) 5-325 MG tablet       Follow Up Instructions:  Follow-up in 6 months with lab checks and blood work   I discussed the assessment and treatment plan with the patient. The patient was provided an opportunity to ask questions and all were answered. The patient agreed with the plan and demonstrated an understanding of the instructions.   The patient was advised to call back or seek an in-person evaluation if the symptoms worsen or if the condition fails to improve as anticipated.  The above assessment and management plan was discussed with the patient. The patient verbalized understanding of and has agreed to the management plan. Patient is aware to call the clinic if symptoms persist or worsen. Patient is aware when to return to the clinic for a follow-up visit. Patient educated on when it is appropriate to go to the emergency department.    I provided 9 minutes of non-face-to-face time during this encounter.    [name], MD

## 2019-04-10 ENCOUNTER — Other Ambulatory Visit: Payer: Self-pay

## 2019-04-10 ENCOUNTER — Ambulatory Visit: Payer: Self-pay | Attending: Internal Medicine

## 2019-04-10 DIAGNOSIS — Z20822 Contact with and (suspected) exposure to covid-19: Secondary | ICD-10-CM | POA: Insufficient documentation

## 2019-04-11 LAB — NOVEL CORONAVIRUS, NAA: SARS-CoV-2, NAA: NOT DETECTED

## 2019-04-17 ENCOUNTER — Ambulatory Visit: Payer: Self-pay | Attending: Internal Medicine

## 2019-04-17 ENCOUNTER — Other Ambulatory Visit: Payer: Self-pay

## 2019-04-17 DIAGNOSIS — Z20822 Contact with and (suspected) exposure to covid-19: Secondary | ICD-10-CM

## 2019-04-18 LAB — NOVEL CORONAVIRUS, NAA: SARS-CoV-2, NAA: NOT DETECTED

## 2019-04-21 ENCOUNTER — Ambulatory Visit: Payer: Self-pay

## 2019-04-21 NOTE — Telephone Encounter (Signed)
Patient calling for he covid test results.  Provided results.  Voiced understanding.

## 2019-05-13 ENCOUNTER — Encounter: Payer: Self-pay | Admitting: Family Medicine

## 2019-05-13 ENCOUNTER — Ambulatory Visit (INDEPENDENT_AMBULATORY_CARE_PROVIDER_SITE_OTHER): Payer: Self-pay | Admitting: Family Medicine

## 2019-05-13 DIAGNOSIS — R300 Dysuria: Secondary | ICD-10-CM

## 2019-05-13 DIAGNOSIS — R3989 Other symptoms and signs involving the genitourinary system: Secondary | ICD-10-CM

## 2019-05-13 DIAGNOSIS — R350 Frequency of micturition: Secondary | ICD-10-CM

## 2019-05-13 MED ORDER — SULFAMETHOXAZOLE-TRIMETHOPRIM 800-160 MG PO TABS: 1.0000 | ORAL_TABLET | Freq: Two times a day (BID) | ORAL | 0 refills | Status: AC

## 2019-05-13 NOTE — Progress Notes (Signed)
Virtual Visit via telephone Note Due to COVID-19 pandemic this visit was conducted virtually. This visit type was conducted due to national recommendations for restrictions regarding the COVID-19 Pandemic (e.g. social distancing, sheltering in place) in an effort to limit this patient's exposure and mitigate transmission in our community. All issues noted in this document were discussed and addressed.  A physical exam was not performed with this format.   I connected with Kimberly Donaldson on 05/13/2019 at 1300 by telephone and verified that I am speaking with the correct person using two identifiers. Kimberly Donaldson is currently located at home and family is currently with them during visit. The provider, Monia Pouch, FNP is located in their office at time of visit.  I discussed the limitations, risks, security and privacy concerns of performing an evaluation and management service by telephone and the availability of in person appointments. I also discussed with the patient that there may be a patient responsible charge related to this service. The patient expressed understanding and agreed to proceed.  Subjective:  Patient ID: Kimberly Donaldson, female    DOB: [birth year], 64 y.o.   MRN: [id number]  Chief Complaint:  Urinary Tract Infection   HPI: Kimberly Donaldson is a 64 y.o. female presenting on 05/13/2019 for Urinary Tract Infection   Urinary Tract Infection  This is a new problem. The current episode started in the past 7 days. The problem occurs every urination. The problem has been gradually worsening. The quality of the pain is described as burning. The pain is at a severity of 2/10. The pain is mild. There has been no fever. She is not sexually active. There is no history of pyelonephritis. Associated symptoms include frequency and urgency. Pertinent negatives include no chills, discharge, flank pain, hematuria, hesitancy, nausea, possible pregnancy, sweats or vomiting. She has tried nothing  for the symptoms. The treatment provided no relief.     Relevant past medical, surgical, family, and social history reviewed and updated as indicated.  Allergies and medications reviewed and updated.   Past Medical History:  Diagnosis Date  . Diabetes mellitus without complication [facility])     Past Surgical History:  Procedure Laterality Date  . CESAREAN SECTION    . FOOT SURGERY    . WRIST SURGERY      Social History   Socioeconomic History  . Marital status: Married    Spouse name: Not on file  . Number of children: Not on file  . Years of education: Not on file  . Highest education level: Not on file  Occupational History  . Not on file  Tobacco Use  . Smoking status: Current Every Day Smoker    Packs/day: 1.00  . Smokeless tobacco: Never Used  Substance and Sexual Activity  . Alcohol use: No    Alcohol/week: 0.0 standard drinks  . Drug use: No  . Sexual activity: Not on file  Other Topics Concern  . Not on file  Social History Narrative  . Not on file   Social Determinants of Health   Financial Resource Strain:   . Difficulty of Paying Living Expenses: Not on file  Food Insecurity:   . Worried About Charity fundraiser in the Last Year: Not on file  . Ran Out of Food in the Last Year: Not on file  Transportation Needs:   . Lack of Transportation (Medical): Not on file  . Lack of Transportation (Non-Medical): Not on file  Physical Activity:   .  Days of Exercise per Week: Not on file  . Minutes of Exercise per Session: Not on file  Stress:   . Feeling of Stress : Not on file  Social Connections:   . Frequency of Communication with Friends and Family: Not on file  . Frequency of Social Gatherings with Friends and Family: Not on file  . Attends Religious Services: Not on file  . Active Member of Clubs or Organizations: Not on file  . Attends Archivist Meetings: Not on file  . Marital Status: Not on file  Intimate Partner Violence:   . Fear of  Current or Ex-Partner: Not on file  . Emotionally Abused: Not on file  . Physically Abused: Not on file  . Sexually Abused: Not on file    Outpatient Encounter Medications as of 05/13/2019  Medication Sig  . cyclobenzaprine (FLEXERIL) 10 MG tablet TAKE 1 TABLET THREE TIMES DAILY AS NEEDED FOR MUSCLE SPASM  . ibuprofen (ADVIL,MOTRIN) 200 MG tablet Take 800 mg by mouth every 6 (six) hours as needed for moderate pain.  [name] lisinopril (ZESTRIL) 20 MG tablet Take 1 tablet (20 mg total) by mouth daily.  [name] oxyCODONE-acetaminophen (ROXICET) 5-325 MG tablet Take 1 tablet by mouth every 8 (eight) hours as needed for severe pain.  [name] sulfamethoxazole-trimethoprim (BACTRIM DS) 800-160 MG tablet Take 1 tablet by mouth 2 (two) times daily for 7 days.   No facility-administered encounter medications on file as of 05/13/2019.    Allergies  Allergen Reactions  . Codeine     REACTION: ringing in ears  . Morphine     REACTION: hives  . Other     sporinof - for nail fungus    Review of Systems  Constitutional: Negative for activity change, appetite change, chills, diaphoresis, fatigue, fever and unexpected weight change.  HENT: Negative.   Eyes: Negative.  Negative for photophobia and visual disturbance.  Respiratory: Negative for cough, chest tightness and shortness of breath.   Cardiovascular: Negative for chest pain, palpitations and leg swelling.  Gastrointestinal: Negative for abdominal pain, blood in stool, constipation, diarrhea, nausea and vomiting.  Endocrine: Negative.  Negative for polydipsia, polyphagia and polyuria.  Genitourinary: Positive for dysuria, frequency and urgency. Negative for decreased urine volume, difficulty urinating, dyspareunia, enuresis, flank pain, hematuria, hesitancy, menstrual problem, pelvic pain, vaginal bleeding, vaginal discharge and vaginal pain.  Musculoskeletal: Negative for arthralgias and myalgias.  Skin: Negative.   Allergic/Immunologic: Negative.     Neurological: Negative for dizziness, tremors, seizures, syncope, facial asymmetry, speech difficulty, weakness, light-headedness, numbness and headaches.  Hematological: Negative.   Psychiatric/Behavioral: Negative for confusion, hallucinations, sleep disturbance and suicidal ideas.  All other systems reviewed and are negative.        Observations/Objective: No vital signs or physical exam, this was a telephone or virtual health encounter.  Pt alert and oriented, answers all questions appropriately, and able to speak in full sentences.    Assessment and Plan: Kimberly Donaldson was seen today for urinary tract infection.  Diagnoses and all orders for this visit:  Dysuria Frequency of micturition Suspected UTI Reported symptoms consistent with UTI. Previous urine culture reviewed and antibiotic selection based on review. No red flags concerning for acute pyelonephritis. Will treat with below. Symptomatic care discussed in detail. Report any new, worsening, or persistent symptoms.  -     sulfamethoxazole-trimethoprim (BACTRIM DS) 800-160 MG tablet; Take 1 tablet by mouth 2 (two) times daily for 7 days.     Follow Up Instructions: Return if symptoms  worsen or fail to improve.    I discussed the assessment and treatment plan with the patient. The patient was provided an opportunity to ask questions and all were answered. The patient agreed with the plan and demonstrated an understanding of the instructions.   The patient was advised to call back or seek an in-person evaluation if the symptoms worsen or if the condition fails to improve as anticipated.  The above assessment and management plan was discussed with the patient. The patient verbalized understanding of and has agreed to the management plan. Patient is aware to call the clinic if they develop any new symptoms or if symptoms persist or worsen. Patient is aware when to return to the clinic for a follow-up visit. Patient educated on  when it is appropriate to go to the emergency department.    I provided 15 minutes of non-face-to-face time during this encounter. The call started at 1300. The call ended at 1315. The other time was used for coordination of care.    Monia Pouch, FNP-C [facility] [address] [place], [place] [address] [phone] 05/13/2019

## 2019-06-12 ENCOUNTER — Telehealth (INDEPENDENT_AMBULATORY_CARE_PROVIDER_SITE_OTHER): Payer: Self-pay | Admitting: Family Medicine

## 2019-06-12 ENCOUNTER — Encounter: Payer: Self-pay | Admitting: Family Medicine

## 2019-06-12 DIAGNOSIS — M549 Dorsalgia, unspecified: Secondary | ICD-10-CM

## 2019-06-12 DIAGNOSIS — L03115 Cellulitis of right lower limb: Secondary | ICD-10-CM

## 2019-06-12 DIAGNOSIS — G8929 Other chronic pain: Secondary | ICD-10-CM

## 2019-06-12 DIAGNOSIS — I739 Peripheral vascular disease, unspecified: Secondary | ICD-10-CM

## 2019-06-12 MED ORDER — CEPHALEXIN 500 MG PO CAPS: 500.0000 mg | ORAL_CAPSULE | Freq: Four times a day (QID) | ORAL | 0 refills | Status: DC

## 2019-06-12 MED ORDER — OXYCODONE-ACETAMINOPHEN 5-325 MG PO TABS: 1.0000 | ORAL_TABLET | Freq: Three times a day (TID) | ORAL | 0 refills | Status: DC | PRN

## 2019-06-12 NOTE — Progress Notes (Signed)
Virtual Visit via Mychart Video Note  I connected with Kimberly Donaldson on 06/12/19 at 1155 by video and verified that I am speaking with the correct person using two identifiers. Kimberly Donaldson is currently located at home and no other people are currently with her during visit. The provider, Fransisca Kaufmann Kalvin Buss, MD is located in their office at time of visit.  Call ended at 1210  I discussed the limitations, risks, security and privacy concerns of performing an evaluation and management service by video and the availability of in person appointments. I also discussed with the patient that there may be a patient responsible charge related to this service. The patient expressed understanding and agreed to proceed.   History and Present Illness: Patient has a sore on her right anterior ankle that has been there for 3-4 weeks.  She is using antibiotic cream and it burns and stings. And it has not gotten better.  The sore can be painful. She brushed against sons bed that caused it.   No diagnosis found.  Outpatient Encounter Medications as of 06/12/2019  Medication Sig  . cyclobenzaprine (FLEXERIL) 10 MG tablet TAKE 1 TABLET THREE TIMES DAILY AS NEEDED FOR MUSCLE SPASM  . ibuprofen (ADVIL,MOTRIN) 200 MG tablet Take 800 mg by mouth every 6 (six) hours as needed for moderate pain.  [name] lisinopril (ZESTRIL) 20 MG tablet Take 1 tablet (20 mg total) by mouth daily.  [name] oxyCODONE-acetaminophen (ROXICET) 5-325 MG tablet Take 1 tablet by mouth every 8 (eight) hours as needed for severe pain.   No facility-administered encounter medications on file as of 06/12/2019.    Review of Systems  Constitutional: Negative for chills and fever.  Eyes: Negative for visual disturbance.  Respiratory: Negative for chest tightness and shortness of breath.   Cardiovascular: Negative for chest pain and leg swelling.  Skin: Positive for color change and wound. Negative for rash.  Neurological: Negative for  light-headedness and headaches.  Psychiatric/Behavioral: Negative for agitation and behavioral problems.  All other systems reviewed and are negative.   Observations/Objective: Patient sounds and looks comfortable  Has a slight area of erythema about the size of a quarter with a smaller wound about half that size in the center on her anterior right lower leg, no purulence noted, she says it is tender to palpation.  Via video  Assessment and Plan: Problem List Items Addressed This Visit      Other   Chronic back pain   Relevant Medications   oxyCODONE-acetaminophen (ROXICET) 5-325 MG tablet    Other Visit Diagnoses    Arterial insufficiency of lower extremity (HCC)    -  Primary   Relevant Medications   cephALEXin (KEFLEX) 500 MG capsule   Cellulitis of right lower extremity       Relevant Medications   cephALEXin (KEFLEX) 500 MG capsule      Will give Keflex for possible cellulitis around the site and recommended wet-to-dry dressings and instructed her using things at home such as Vaseline and gauze on how to do a wet-to-dry dressing and change it every day and come back in 1 to 2 weeks.  Gave refill on the oxycodone for chronic back pain, its been almost a year since she had one.  Will need contract in person in the future. Follow up plan: Return if symptoms worsen or fail to improve, for 1-2 weeks woundcare.     I discussed the assessment and treatment plan with the patient. The patient was provided  an opportunity to ask questions and all were answered. The patient agreed with the plan and demonstrated an understanding of the instructions.   The patient was advised to call back or seek an in-person evaluation if the symptoms worsen or if the condition fails to improve as anticipated.  The above assessment and management plan was discussed with the patient. The patient verbalized understanding of and has agreed to the management plan. Patient is aware to call the clinic if  symptoms persist or worsen. Patient is aware when to return to the clinic for a follow-up visit. Patient educated on when it is appropriate to go to the emergency department.    I provided 15 minutes of non-face-to-face time during this encounter.    [name], MD

## 2019-07-03 ENCOUNTER — Ambulatory Visit (INDEPENDENT_AMBULATORY_CARE_PROVIDER_SITE_OTHER): Payer: Self-pay | Admitting: Family Medicine

## 2019-07-03 ENCOUNTER — Encounter: Payer: Self-pay | Admitting: Family Medicine

## 2019-07-03 ENCOUNTER — Other Ambulatory Visit: Payer: Self-pay

## 2019-07-03 VITALS — BP 212/79 | HR 65 | Temp 97.5°F | Ht 64.0 in | Wt 204.0 lb

## 2019-07-03 DIAGNOSIS — L03115 Cellulitis of right lower limb: Secondary | ICD-10-CM

## 2019-07-03 MED ORDER — SULFAMETHOXAZOLE-TRIMETHOPRIM 800-160 MG PO TABS: 1.0000 | ORAL_TABLET | Freq: Two times a day (BID) | ORAL | 0 refills | Status: DC

## 2019-07-03 NOTE — Progress Notes (Signed)
BP (!) 212/79   Pulse 65   Temp (!) 97.5 F (36.4 C) (Temporal)   Ht 5\' 4"  (1.626 m)   Wt 204 lb (92.5 kg)   BMI 35.02 kg/m    Subjective:   Patient ID: Kimberly Donaldson, female    DOB: [birth year], 64 y.o.   MRN: [id number]  HPI: Kimberly Donaldson is a 64 y.o. female presenting on 07/03/2019 for Wound Check (pt here today c/o wound on right lower leg that isn't healing)   HPI Coming in for a wound recheck on her right shin that is not healing, there is some erythema surrounding it that comes and goes and it is draining slight amount of purulent material.  She says it is tender in the center and has a scab in the center as well.  This is been there for about 3 weeks, she was seen virtually for this and tried Keflex but said it did not improve much on the Keflex.  Patient denies any fevers or chills or pain or swelling anywhere else.  Relevant past medical, surgical, family and social history reviewed and updated as indicated. Interim medical history since our last visit reviewed. Allergies and medications reviewed and updated.  Review of Systems  Constitutional: Negative for chills and fever.  Respiratory: Negative for chest tightness and shortness of breath.   Cardiovascular: Negative for chest pain and leg swelling.  Musculoskeletal: Negative for back pain and gait problem.  Skin: Positive for wound. Negative for rash.  Neurological: Negative for light-headedness and headaches.  Psychiatric/Behavioral: Negative for agitation and behavioral problems.  All other systems reviewed and are negative.   Per HPI unless specifically indicated above   Allergies as of 07/03/2019      Reactions   Codeine    REACTION: ringing in ears   Morphine    REACTION: hives   Other    sporinof - for nail fungus      Medication List       Accurate as of July 03, 2019  9:24 AM. If you have any questions, ask your nurse or doctor.        STOP taking these medications   cephALEXin 500 MG  capsule Commonly known as: KEFLEX Stopped by: Fransisca Kaufmann Anglia Blakley, MD     TAKE these medications   cyclobenzaprine 10 MG tablet Commonly known as: FLEXERIL TAKE 1 TABLET THREE TIMES DAILY AS NEEDED FOR MUSCLE SPASM   ibuprofen 200 MG tablet Commonly known as: ADVIL Take 800 mg by mouth every 6 (six) hours as needed for moderate pain.   lisinopril 20 MG tablet Commonly known as: ZESTRIL Take 1 tablet (20 mg total) by mouth daily.   oxyCODONE-acetaminophen 5-325 MG tablet Commonly known as: Roxicet Take 1 tablet by mouth every 8 (eight) hours as needed for severe pain.        Objective:   BP (!) 212/79   Pulse 65   Temp (!) 97.5 F (36.4 C) (Temporal)   Ht 5\' 4"  (1.626 m)   Wt 204 lb (92.5 kg)   BMI 35.02 kg/m   Wt Readings from Last 3 Encounters:  07/03/19 204 lb (92.5 kg)  02/05/18 209 lb 3.2 oz (94.9 kg)  08/28/17 213 lb 6.4 oz (96.8 kg)    Physical Exam Vitals and nursing note reviewed.  Constitutional:      General: She is not in acute distress.    Appearance: She is well-developed. She is not diaphoretic.  Eyes:  Conjunctiva/sclera: Conjunctivae normal.  Skin:    General: Skin is warm and dry.     Findings: Lesion (.25 cm area that is open but very superficial and draining a slight purulent material that had a scab but able to remove with simple Q-tip.  Took culture.  Area of erythema surrounding it about 1 cm in diameter) present. No rash.  Neurological:     Mental Status: She is alert and oriented to person, place, and time.     Coordination: Coordination normal.  Psychiatric:        Behavior: Behavior normal.       Assessment & Plan:   Problem List Items Addressed This Visit    None    Visit Diagnoses    Cellulitis of right lower extremity    -  Primary   Relevant Medications   sulfamethoxazole-trimethoprim (BACTRIM DS) 800-160 MG tablet   Other Relevant Orders   Anaerobic and Aerobic Culture    Patient is not taking her blood pressure  medicine so she will restart it.  Wound dressing with wet to dry, will run culture  Patient declines blood work or A1c or any other labs today.  Follow up plan: Return in about 1 week (around 07/10/2019), or if symptoms worsen or fail to improve, for Wound recheck.  Counseling provided for all of the vaccine components No orders of the defined types were placed in this encounter.   Caryl Pina, MD [facility] 07/03/2019, 9:24 AM

## 2019-07-03 NOTE — Patient Instructions (Signed)
Using Xeroform gauze, do wet-to-dry dressings and replace every day

## 2019-07-07 LAB — ANAEROBIC AND AEROBIC CULTURE

## 2019-07-14 ENCOUNTER — Ambulatory Visit (INDEPENDENT_AMBULATORY_CARE_PROVIDER_SITE_OTHER): Payer: Self-pay | Admitting: Family Medicine

## 2019-07-14 ENCOUNTER — Other Ambulatory Visit: Payer: Self-pay

## 2019-07-14 ENCOUNTER — Encounter: Payer: Self-pay | Admitting: Family Medicine

## 2019-07-14 VITALS — BP 144/54 | HR 64 | Temp 97.4°F | Ht 64.0 in | Wt 205.0 lb

## 2019-07-14 DIAGNOSIS — I739 Peripheral vascular disease, unspecified: Secondary | ICD-10-CM

## 2019-07-14 NOTE — Progress Notes (Signed)
BP (!) 144/54   Pulse 64   Temp (!) 97.4 F (36.3 C)   Ht 5\' 4"  (1.626 m)   Wt 205 lb (93 kg)   SpO2 99%   BMI 35.19 kg/m    Subjective:   Patient ID: Kimberly Donaldson, female    DOB: Jun 06, 1955, 64 y.o.   MRN: [id number]  HPI: Kimberly Donaldson is a 64 y.o. female presenting on 07/14/2019 for Wound Check (RLE)   HPI Patient's blood pressure looks better today, she is coming in for repeat wound care, she is using Santyl which she has from a family member on it daily and changing the dressing daily.  She is starting a little spot where is sore from the bandage itself.  Patient denies any drainage but still has a lot of tenderness with the ulcer.  She is still smoking and we discussed smoking cessation and the importance of that including increasing her protein and taking a baby aspirin daily.  Relevant past medical, surgical, family and social history reviewed and updated as indicated. Interim medical history since our last visit reviewed. Allergies and medications reviewed and updated.  Review of Systems  Constitutional: Negative for chills and fever.  Skin: Positive for wound. Negative for rash.  Neurological: Negative for light-headedness and headaches.  Psychiatric/Behavioral: Negative for agitation and behavioral problems.  All other systems reviewed and are negative.   Per HPI unless specifically indicated above   Allergies as of 07/14/2019      Reactions   Codeine    REACTION: ringing in ears   Morphine    REACTION: hives   Other    sporinof - for nail fungus      Medication List       Accurate as of July 14, 2019  9:31 AM. If you have any questions, ask your nurse or doctor.        collagenase ointment Commonly known as: SANTYL Apply 1 application topically daily.   cyclobenzaprine 10 MG tablet Commonly known as: FLEXERIL TAKE 1 TABLET THREE TIMES DAILY AS NEEDED FOR MUSCLE SPASM   ibuprofen 200 MG tablet Commonly known as: ADVIL Take 800 mg by mouth  every 6 (six) hours as needed for moderate pain.   lisinopril 20 MG tablet Commonly known as: ZESTRIL Take 1 tablet (20 mg total) by mouth daily.   oxyCODONE-acetaminophen 5-325 MG tablet Commonly known as: Roxicet Take 1 tablet by mouth every 8 (eight) hours as needed for severe pain.   sulfamethoxazole-trimethoprim 800-160 MG tablet Commonly known as: BACTRIM DS Take 1 tablet by mouth 2 (two) times daily.        Objective:   BP (!) 144/54   Pulse 64   Temp (!) 97.4 F (36.3 C)   Ht 5\' 4"  (1.626 m)   Wt 205 lb (93 kg)   SpO2 99%   BMI 35.19 kg/m   Wt Readings from Last 3 Encounters:  07/14/19 205 lb (93 kg)  07/03/19 204 lb (92.5 kg)  02/05/18 209 lb 3.2 oz (94.9 kg)    Physical Exam Vitals and nursing note reviewed.  Constitutional:      General: She is not in acute distress.    Appearance: She is well-developed. She is not diaphoretic.  Eyes:     Conjunctiva/sclera: Conjunctivae normal.  Skin:    General: Skin is warm and dry.     Findings: Wound (0.25 cm area of ulceration, very superficial, very tender to palpation, slight erythema around but no  signs of infection and no drainage.  More superficial than before.) present. No rash.  Neurological:     Mental Status: She is alert and oriented to person, place, and time.     Coordination: Coordination normal.  Psychiatric:        Behavior: Behavior normal.       Assessment & Plan:   Problem List Items Addressed This Visit    None    Visit Diagnoses    Arterial insufficiency of lower extremity ([place])    -  Primary      Recheck wound and in 1 to 2 weeks, continue with Santyl and daily changes using gauze, try to avoid putting tape directly on the skin. Follow up plan: Return if symptoms worsen or fail to improve, for 1 to 2-week wound recheck.  Counseling provided for all of the vaccine components No orders of the defined types were placed in this encounter.   Caryl Pina, MD [facility] 07/14/2019, 9:31 AM

## 2019-07-27 ENCOUNTER — Encounter: Payer: Self-pay | Admitting: Family Medicine

## 2019-07-27 ENCOUNTER — Ambulatory Visit (INDEPENDENT_AMBULATORY_CARE_PROVIDER_SITE_OTHER): Payer: Self-pay | Admitting: Family Medicine

## 2019-07-27 ENCOUNTER — Other Ambulatory Visit: Payer: Self-pay

## 2019-07-27 VITALS — BP 176/66 | HR 66 | Temp 97.2°F | Ht 64.0 in | Wt 204.0 lb

## 2019-07-27 DIAGNOSIS — L98499 Non-pressure chronic ulcer of skin of other sites with unspecified severity: Secondary | ICD-10-CM

## 2019-07-27 DIAGNOSIS — I771 Stricture of artery: Secondary | ICD-10-CM

## 2019-07-27 NOTE — Progress Notes (Signed)
BP (!) 176/66   Pulse 66   Temp (!) 97.2 F (36.2 C)   Ht 5\' 4"  (1.626 m)   Wt 204 lb (92.5 kg)   SpO2 99%   BMI 35.02 kg/m    Subjective:   Patient ID: Kimberly Donaldson, female    DOB: 04-07-1955, 64 y.o.   MRN: [id number]  HPI: Kimberly Donaldson is a 64 y.o. female presenting on 07/27/2019 for Wound Check (RLE)   HPI Wound recheck Patient is coming in for wound recheck on right shin, she has been doing Santyl and wrapping it and keeping it covered most of the day.  She says it hurts less and is starting to itch and is a lot less deep but about the same size.  Relevant past medical, surgical, family and social history reviewed and updated as indicated. Interim medical history since our last visit reviewed. Allergies and medications reviewed and updated.  Review of Systems  Constitutional: Negative for chills and fever.  Eyes: Negative for visual disturbance.  Respiratory: Negative for chest tightness and shortness of breath.   Cardiovascular: Negative for chest pain and leg swelling.  Skin: Positive for wound. Negative for rash.  All other systems reviewed and are negative.   Per HPI unless specifically indicated above   Allergies as of 07/27/2019      Reactions   Codeine    REACTION: ringing in ears   Morphine    REACTION: hives   Other    sporinof - for nail fungus      Medication List       Accurate as of Jul 27, 2019 10:07 AM. If you have any questions, ask your nurse or doctor.        STOP taking these medications   sulfamethoxazole-trimethoprim 800-160 MG tablet Commonly known as: BACTRIM DS Stopped by: Fransisca Kaufmann Crew Goren, MD     TAKE these medications   collagenase ointment Commonly known as: SANTYL Apply 1 application topically daily.   cyclobenzaprine 10 MG tablet Commonly known as: FLEXERIL TAKE 1 TABLET THREE TIMES DAILY AS NEEDED FOR MUSCLE SPASM   ibuprofen 200 MG tablet Commonly known as: ADVIL Take 800 mg by mouth every 6 (six) hours  as needed for moderate pain.   lisinopril 20 MG tablet Commonly known as: ZESTRIL Take 1 tablet (20 mg total) by mouth daily.   oxyCODONE-acetaminophen 5-325 MG tablet Commonly known as: Roxicet Take 1 tablet by mouth every 8 (eight) hours as needed for severe pain.        Objective:   BP (!) 176/66   Pulse 66   Temp (!) 97.2 F (36.2 C)   Ht 5\' 4"  (1.626 m)   Wt 204 lb (92.5 kg)   SpO2 99%   BMI 35.02 kg/m   Wt Readings from Last 3 Encounters:  07/27/19 204 lb (92.5 kg)  07/14/19 205 lb (93 kg)  07/03/19 204 lb (92.5 kg)    Physical Exam Vitals and nursing note reviewed.  Constitutional:      General: She is not in acute distress.    Appearance: She is well-developed. She is not diaphoretic.  Eyes:     Conjunctiva/sclera: Conjunctivae normal.  Skin:    Findings: Lesion (1-1/4 cm ulcer on right shin, about midway up.  Good pink granulation tissue base, very shallow, almost no depth.  Clear drainage, no erythema) present. No rash.  Neurological:     Mental Status: She is alert and oriented to person, place, and  time.     Coordination: Coordination normal.  Psychiatric:        Behavior: Behavior normal.       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 Follow up plan: Return if symptoms worsen or fail to improve, for 2 to 3-week wound recheck.  Counseling provided for all of the vaccine components No orders of the defined types were placed in this encounter.   Caryl Pina, MD [facility] 07/27/2019, 10:07 AM

## 2019-08-19 ENCOUNTER — Ambulatory Visit: Payer: Self-pay | Admitting: Family Medicine

## 2019-08-28 ENCOUNTER — Other Ambulatory Visit: Payer: Self-pay

## 2019-08-28 ENCOUNTER — Ambulatory Visit (INDEPENDENT_AMBULATORY_CARE_PROVIDER_SITE_OTHER): Payer: 59 | Admitting: Family Medicine

## 2019-08-28 ENCOUNTER — Encounter: Payer: Self-pay | Admitting: Family Medicine

## 2019-08-28 VITALS — BP 151/74 | HR 69 | Temp 97.7°F | Ht 64.0 in | Wt 203.2 lb

## 2019-08-28 DIAGNOSIS — I739 Peripheral vascular disease, unspecified: Secondary | ICD-10-CM

## 2019-08-28 MED ORDER — COLLAGENASE 250 UNIT/GM EX OINT: 1.0000 "application " | TOPICAL_OINTMENT | Freq: Every day | CUTANEOUS | 1 refills | Status: DC

## 2019-08-28 NOTE — Progress Notes (Signed)
BP (!) 151/74   Pulse 69   Temp 97.7 F (36.5 C) (Temporal)   Ht 5\' 4"  (1.626 m)   Wt 203 lb 4 oz (92.2 kg)   BMI 34.89 kg/m    Subjective:   Patient ID: Kimberly Donaldson, female    DOB: October 17, 1955, 64 y.o.   MRN: [id number]  HPI: Kimberly Donaldson is a 64 y.o. female presenting on 08/28/2019 for No chief complaint on file.   HPI Recheck of right shin wound/ulcer She has been continue the [place] and is continuing to decrease in size and improved.  Is still quite tender to touch.  She denies any major drainage.  She feels like it is healing very well.  Relevant past medical, surgical, family and social history reviewed and updated as indicated. Interim medical history since our last visit reviewed. Allergies and medications reviewed and updated.  Review of Systems  Constitutional: Negative for chills and fever.  Skin: Positive for wound. Negative for color change and rash.    Per HPI unless specifically indicated above   Allergies as of 08/28/2019      Reactions   Codeine    REACTION: ringing in ears   Morphine    REACTION: hives   Other    sporinof - for nail fungus      Medication List       Accurate as of August 28, 2019  4:25 PM. If you have any questions, ask your nurse or doctor.        aspirin EC 81 MG tablet Take 81 mg by mouth daily. Swallow whole.   collagenase ointment Commonly known as: SANTYL Apply 1 application topically daily.   cyclobenzaprine 10 MG tablet Commonly known as: FLEXERIL TAKE 1 TABLET THREE TIMES DAILY AS NEEDED FOR MUSCLE SPASM   ibuprofen 200 MG tablet Commonly known as: ADVIL Take 800 mg by mouth every 6 (six) hours as needed for moderate pain.   lisinopril 20 MG tablet Commonly known as: ZESTRIL Take 1 tablet (20 mg total) by mouth daily.   oxyCODONE-acetaminophen 5-325 MG tablet Commonly known as: Roxicet Take 1 tablet by mouth every 8 (eight) hours as needed for severe pain.        Objective:   BP (!) 151/74    Pulse 69   Temp 97.7 F (36.5 C) (Temporal)   Ht 5\' 4"  (1.626 m)   Wt 203 lb 4 oz (92.2 kg)   BMI 34.89 kg/m   Wt Readings from Last 3 Encounters:  08/28/19 203 lb 4 oz (92.2 kg)  07/27/19 204 lb (92.5 kg)  07/14/19 205 lb (93 kg)    Physical Exam Vitals and nursing note reviewed.  Skin:    General: Skin is warm and dry.     Findings: Wound (Very superficial 0.2 x 0.25 ulceration on right anterior shin, small amount of clear drainage, no signs of erythema or infection) present.       Assessment & Plan:   Problem List Items Addressed This Visit    None    Visit Diagnoses    Arterial insufficiency of lower extremity (H[facility])    -  Primary   Relevant Medications   aspirin EC 81 MG tablet   collagenase (SANTYL) ointment      Continue to use Santyl and then use wet to dry dressing and change daily, looking a lot better Follow up plan: Return if symptoms worsen or fail to improve, for Follow-up in 2 to 3 weeks for  wound recheck.  Counseling provided for all of the vaccine components No orders of the defined types were placed in this encounter.   Caryl Pina, MD [facility] 08/28/2019, 4:25 PM

## 2019-09-16 ENCOUNTER — Ambulatory Visit: Payer: 59 | Admitting: Family Medicine

## 2019-10-07 ENCOUNTER — Ambulatory Visit: Payer: 59 | Admitting: Family Medicine

## 2019-10-13 ENCOUNTER — Other Ambulatory Visit: Payer: Self-pay | Admitting: Family Medicine

## 2019-10-13 DIAGNOSIS — I10 Essential (primary) hypertension: Secondary | ICD-10-CM

## 2019-10-14 ENCOUNTER — Other Ambulatory Visit: Payer: Self-pay | Admitting: Family Medicine

## 2019-10-14 DIAGNOSIS — G8929 Other chronic pain: Secondary | ICD-10-CM

## 2019-12-31 ENCOUNTER — Other Ambulatory Visit: Payer: Self-pay

## 2019-12-31 ENCOUNTER — Ambulatory Visit (INDEPENDENT_AMBULATORY_CARE_PROVIDER_SITE_OTHER): Payer: 59 | Admitting: Nurse Practitioner

## 2019-12-31 ENCOUNTER — Encounter: Payer: Self-pay | Admitting: Nurse Practitioner

## 2019-12-31 VITALS — BP 155/67 | HR 63 | Temp 98.3°F | Ht 64.0 in | Wt 195.2 lb

## 2019-12-31 DIAGNOSIS — S81801A Unspecified open wound, right lower leg, initial encounter: Secondary | ICD-10-CM | POA: Diagnosis not present

## 2019-12-31 MED ORDER — CEPHALEXIN 500 MG PO CAPS: 500.0000 mg | ORAL_CAPSULE | Freq: Two times a day (BID) | ORAL | 0 refills | Status: DC

## 2019-12-31 NOTE — Patient Instructions (Signed)
Follow up in 1 week.   Wound Care, Adult Taking care of your wound properly can help to prevent pain, infection, and scarring. It can also help your wound to heal more quickly. How to care for your wound Wound care      Follow instructions from your health care provider about how to take care of your wound. Make sure you: ? Wash your hands with soap and water before you change the bandage (dressing). If soap and water are not available, use hand sanitizer. ? Change your dressing as told by your health care provider. ? Leave stitches (sutures), skin glue, or adhesive strips in place. These skin closures may need to stay in place for 2 weeks or longer. If adhesive strip edges start to loosen and curl up, you may trim the loose edges. Do not remove adhesive strips completely unless your health care provider tells you to do that.  Check your wound area every day for signs of infection. Check for: ? Redness, swelling, or pain. ? Fluid or blood. ? Warmth. ? Pus or a bad smell.  Ask your health care provider if you should clean the wound with mild soap and water. Doing this may include: ? Using a clean towel to pat the wound dry after cleaning it. Do not rub or scrub the wound. ? Applying a cream or ointment. Do this only as told by your health care provider. ? Covering the incision with a clean dressing.  Ask your health care provider when you can leave the wound uncovered.  Keep the dressing dry until your health care provider says it can be removed. Do not take baths, swim, use a hot tub, or do anything that would put the wound underwater until your health care provider approves. Ask your health care provider if you can take showers. You may only be allowed to take sponge baths. Medicines   If you were prescribed an antibiotic medicine, cream, or ointment, take or use the antibiotic as told by your health care provider. Do not stop taking or using the antibiotic even if your condition  improves.  Take over-the-counter and prescription medicines only as told by your health care provider. If you were prescribed pain medicine, take it 30 or more minutes before you do any wound care or as told by your health care provider. General instructions  Return to your normal activities as told by your health care provider. Ask your health care provider what activities are safe.  Do not scratch or pick at the wound.  Do not use any products that contain nicotine or tobacco, such as cigarettes and e-cigarettes. These may delay wound healing. If you need help quitting, ask your health care provider.  Keep all follow-up visits as told by your health care provider. This is important.  Eat a diet that includes protein, vitamin A, vitamin C, and other nutrient-rich foods to help the wound heal. ? Foods rich in protein include meat, dairy, beans, nuts, and other sources. ? Foods rich in vitamin A include carrots and dark green, leafy vegetables. ? Foods rich in vitamin C include citrus, tomatoes, and other fruits and vegetables. ? Nutrient-rich foods have protein, carbohydrates, fat, vitamins, or minerals. Eat a variety of healthy foods including vegetables, fruits, and whole grains. Contact a health care provider if:  You received a tetanus shot and you have swelling, severe pain, redness, or bleeding at the injection site.  Your pain is not controlled with medicine.  You have redness,  swelling, or pain around the wound.  You have fluid or blood coming from the wound.  Your wound feels warm to the touch.  You have pus or a bad smell coming from the wound.  You have a fever or chills.  You are nauseous or you vomit.  You are dizzy. Get help right away if:  You have a red streak going away from your wound.  The edges of the wound open up and separate.  Your wound is bleeding, and the bleeding does not stop with gentle pressure.  You have a rash.  You faint.  You have  trouble breathing. Summary  Always wash your hands with soap and water before changing your bandage (dressing).  To help with healing, eat foods that are rich in protein, vitamin A, vitamin C, and other nutrients.  Check your wound every day for signs of infection. Contact your health care provider if you suspect that your wound is infected. This information is not intended to replace advice given to you by your health care provider. Make sure you discuss any questions you have with your health care provider. Document Revised: 06/23/2018 Document Reviewed: 09/20/2015 Elsevier Patient Education  [place].

## 2019-12-31 NOTE — Progress Notes (Addendum)
Acute Office Visit  Subjective:    Patient ID: Kimberly Donaldson, female    DOB: 1956/03/13, 64 y.o.   MRN: [id number]  Chief Complaint  Patient presents with  . Wound Check    right lower leg     Patient is a 64 year old female who presents to clinic for right leg wound.  Wound is present in her right lower extremity for about 4 weeks.  Patient has cleaned and dressed wound with Santyl.  Wound is erythematous, painful to touch and slightly warm around skin.  No signs and symptoms of infection.  Patient has a history of diabetes mellitus.   Past Medical History:  Diagnosis Date  . Diabetes mellitus without complication [facility])     Past Surgical History:  Procedure Laterality Date  . CESAREAN SECTION    . FOOT SURGERY    . WRIST SURGERY      Family History  Problem Relation Age of Onset  . Cancer Mother   . Cancer Father   . Diabetes Father       Outpatient Medications Prior to Visit  Medication Sig Dispense Refill  . aspirin EC 81 MG tablet Take 81 mg by mouth daily. Swallow whole.    . collagenase (SANTYL) ointment Apply 1 application topically daily. 30 g 1  . cyclobenzaprine (FLEXERIL) 10 MG tablet TAKE 1 TABLET THREE TIMES DAILY AS NEEDED FOR MUSCLE SPASM 60 tablet 0  . ibuprofen (ADVIL,MOTRIN) 200 MG tablet Take 800 mg by mouth every 6 (six) hours as needed for moderate pain.    [name] lisinopril (ZESTRIL) 20 MG tablet TAKE 1 TABLET DAILY 90 tablet 0  . oxyCODONE-acetaminophen (ROXICET) 5-325 MG tablet Take 1 tablet by mouth every 8 (eight) hours as needed for severe pain. 60 tablet 0   No facility-administered medications prior to visit.    Allergies  Allergen Reactions  . Codeine     REACTION: ringing in ears  . Morphine     REACTION: hives  . Other     sporinof - for nail fungus    Review of Systems  Skin: Positive for color change and wound.  All other systems reviewed and are negative.      Objective:    Physical Exam Vitals reviewed.    Constitutional:      Appearance: Normal appearance.  HENT:     Head: Normocephalic.     Nose: Nose normal.  Eyes:     Pupils: Pupils are equal, round, and reactive to light.  Cardiovascular:     Rate and Rhythm: Normal rate and regular rhythm.     Pulses: Normal pulses.     Heart sounds: Normal heart sounds.  Pulmonary:     Effort: Pulmonary effort is normal.     Breath sounds: Normal breath sounds.  Abdominal:     General: Bowel sounds are normal.  Musculoskeletal:        General: Tenderness present. Normal range of motion.  Skin:    General: Skin is warm.     Findings: Erythema present.     Comments: wound  Neurological:     Mental Status: She is alert and oriented to person, place, and time.     BP (!) 155/67   Pulse 63   Temp 98.3 F (36.8 C) (Temporal)   Ht 5\' 4"  (1.626 m)   Wt 195 lb 3.2 oz (88.5 kg)   SpO2 99%   BMI 33.51 kg/m  Wt Readings from Last 3 Encounters:  12/31/19  195 lb 3.2 oz (88.5 kg)  08/28/19 203 lb 4 oz (92.2 kg)  07/27/19 204 lb (92.5 kg)    Health Maintenance Due  Topic Date Due  . Hepatitis C Screening  Never done  . FOOT EXAM  Never done  . COVID-19 Vaccine (1) Never done  . HIV Screening  Never done  . TETANUS/TDAP  Never done  . HEMOGLOBIN A1C  03/29/2016  . OPHTHALMOLOGY EXAM  09/14/2016  . MAMMOGRAM  09/11/2017  . COLONOSCOPY  11/10/2017  . PAP SMEAR-Modifier  06/23/2018  . INFLUENZA VACCINE  10/18/2019      Lab Results  Component Value Date   WBC 10.0 09/27/2015   HGB 14.9 09/27/2015   HCT 45.4 09/27/2015   MCV 90 09/27/2015   PLT 206 09/27/2015   Lab Results  Component Value Date   NA 140 09/27/2015   K 4.9 09/27/2015   CO2 23 09/27/2015   GLUCOSE 181 (H) 09/27/2015   BUN 20 09/27/2015   CREATININE 0.72 09/27/2015   BILITOT 0.3 09/27/2015   ALKPHOS 83 09/27/2015   AST 12 09/27/2015   ALT 15 09/27/2015   PROT 6.5 09/27/2015   ALBUMIN 4.2 09/27/2015   CALCIUM 9.0 09/27/2015   ANIONGAP 9 05/19/2014    Lab Results  Component Value Date   CHOL 171 06/20/2015   Lab Results  Component Value Date   HDL 57 06/20/2015   Lab Results  Component Value Date   LDLCALC 88 06/20/2015   Lab Results  Component Value Date   TRIG 132 06/20/2015   Lab Results  Component Value Date   CHOLHDL 3.0 06/20/2015   Lab Results  Component Value Date   HGBA1C 7.2 (H) 09/27/2015       Assessment & Plan:     Problem List Items Addressed This Visit      Other   Wound of right leg - Primary    Right leg wound not well managed.  Started patient on Keflex 500 mg twice daily for 7 days.  Wound cleaned wet-to-dry dressing, Vaseline gauze.  Change every day.  Follow-up in 1 week, if wound shows any sign and symptom of infection call back/return   Provided education to patient with printed handouts given.  Rx sent to pharmacy.          Meds ordered this encounter  Medications  . cephALEXin (KEFLEX) 500 MG capsule    Sig: Take 1 capsule (500 mg total) by mouth 2 (two) times daily.    Dispense:  14 capsule    Refill:  0    Order Specific Question:   Supervising Provider    Answer:   Caryl Pina A [[id number]]     Ivy Lynn, NP

## 2019-12-31 NOTE — Assessment & Plan Note (Addendum)
Right leg wound not well managed.  Started patient on Keflex 500 mg twice daily for 7 days.  Wound cleaned in clinic, wet-to-dry dressing used. Vaseline gauze.  Change every day.  Follow-up in 1 week, if wound shows any sign and symptom of infection call back/return   Provided education to patient with printed handouts given.  Rx sent to pharmacy.

## 2020-01-07 ENCOUNTER — Other Ambulatory Visit: Payer: Self-pay

## 2020-01-07 ENCOUNTER — Encounter: Payer: Self-pay | Admitting: Nurse Practitioner

## 2020-01-07 ENCOUNTER — Ambulatory Visit (INDEPENDENT_AMBULATORY_CARE_PROVIDER_SITE_OTHER): Payer: 59 | Admitting: Nurse Practitioner

## 2020-01-07 VITALS — BP 156/63 | HR 62 | Temp 97.3°F | Ht 64.0 in | Wt 198.8 lb

## 2020-01-07 DIAGNOSIS — S81801D Unspecified open wound, right lower leg, subsequent encounter: Secondary | ICD-10-CM | POA: Diagnosis not present

## 2020-01-07 NOTE — Progress Notes (Signed)
Established Patient Office Visit  Subjective:  Patient ID: Kimberly Donaldson, female    DOB: [birth year]  Age: 64 y.o. MRN: [id number]  CC:  Chief Complaint  Patient presents with   Wound Check    right lower extremity    HPI ADDALINE Donaldson presents for follow-up for right leg lower extremity wound.  Present in the last 2 to 3 weeks.  Patient is reporting continuous pain, skin around wound is erythematous.  Patient denies fever and any signs and symptoms of infection.  Patient is on antibiotic and will complete dose this Friday.  Patient has been compliant with treatment and reports cleaning and dressing wound daily.  Past Medical History:  Diagnosis Date   Diabetes mellitus without complication ([place])     Past Surgical History:  Procedure Laterality Date   CESAREAN SECTION     FOOT SURGERY     WRIST SURGERY      Family History  Problem Relation Age of Onset   Cancer Mother    Cancer Father    Diabetes Father       Outpatient Medications Prior to Visit  Medication Sig Dispense Refill   aspirin EC 81 MG tablet Take 81 mg by mouth daily. Swallow whole.     cephALEXin (KEFLEX) 500 MG capsule Take 1 capsule (500 mg total) by mouth 2 (two) times daily. 14 capsule 0   collagenase (SANTYL) ointment Apply 1 application topically daily. 30 g 1   cyclobenzaprine (FLEXERIL) 10 MG tablet TAKE 1 TABLET THREE TIMES DAILY AS NEEDED FOR MUSCLE SPASM 60 tablet 0   ibuprofen (ADVIL,MOTRIN) 200 MG tablet Take 800 mg by mouth every 6 (six) hours as needed for moderate pain.     lisinopril (ZESTRIL) 20 MG tablet TAKE 1 TABLET DAILY 90 tablet 0   oxyCODONE-acetaminophen (ROXICET) 5-325 MG tablet Take 1 tablet by mouth every 8 (eight) hours as needed for severe pain. 60 tablet 0   No facility-administered medications prior to visit.    Allergies  Allergen Reactions   Codeine     REACTION: ringing in ears   Morphine     REACTION: hives   Other     sporinof - for nail  fungus    ROS Review of Systems  Skin: Positive for color change and wound.  All other systems reviewed and are negative.     Objective:    Physical Exam Vitals reviewed.  Constitutional:      Appearance: Normal appearance.  HENT:     Head: Normocephalic.  Eyes:     Conjunctiva/sclera: Conjunctivae normal.  Cardiovascular:     Rate and Rhythm: Normal rate and regular rhythm.     Pulses: Normal pulses.     Heart sounds: Normal heart sounds.  Pulmonary:     Effort: Pulmonary effort is normal.     Breath sounds: Normal breath sounds.  Abdominal:     General: Bowel sounds are normal.  Skin:    General: Skin is warm.     Findings: Erythema present.     Comments: Right leg wound  Neurological:     Mental Status: She is alert and oriented to person, place, and time.  Psychiatric:        Mood and Affect: Mood normal.     BP (!) 156/63    Pulse 62    Temp (!) 97.3 F (36.3 C)    Ht 5\' 4"  (1.626 m)    Wt 198 lb 12.8 oz (90.2 kg)  SpO2 98%    BMI 34.12 kg/m  Wt Readings from Last 3 Encounters:  01/07/20 198 lb 12.8 oz (90.2 kg)  12/31/19 195 lb 3.2 oz (88.5 kg)  08/28/19 203 lb 4 oz (92.2 kg)     Health Maintenance Due  Topic Date Due   Hepatitis C Screening  Never done   FOOT EXAM  Never done   COVID-19 Vaccine (1) Never done   HIV Screening  Never done   TETANUS/TDAP  Never done   HEMOGLOBIN A1C  03/29/2016   OPHTHALMOLOGY EXAM  09/14/2016   MAMMOGRAM  09/11/2017   COLONOSCOPY  11/10/2017   PAP SMEAR-Modifier  06/23/2018   INFLUENZA VACCINE  10/18/2019     No results found for: TSH Lab Results  Component Value Date   WBC 10.0 09/27/2015   HGB 14.9 09/27/2015   HCT 45.4 09/27/2015   MCV 90 09/27/2015   PLT 206 09/27/2015   Lab Results  Component Value Date   NA 140 09/27/2015   K 4.9 09/27/2015   CO2 23 09/27/2015   GLUCOSE 181 (H) 09/27/2015   BUN 20 09/27/2015   CREATININE 0.72 09/27/2015   BILITOT 0.3 09/27/2015   ALKPHOS 83  09/27/2015   AST 12 09/27/2015   ALT 15 09/27/2015   PROT 6.5 09/27/2015   ALBUMIN 4.2 09/27/2015   CALCIUM 9.0 09/27/2015   ANIONGAP 9 05/19/2014   Lab Results  Component Value Date   CHOL 171 06/20/2015   Lab Results  Component Value Date   HDL 57 06/20/2015   Lab Results  Component Value Date   LDLCALC 88 06/20/2015   Lab Results  Component Value Date   TRIG 132 06/20/2015   Lab Results  Component Value Date   CHOLHDL 3.0 06/20/2015   Lab Results  Component Value Date   HGBA1C 7.2 (H) 09/27/2015      Assessment & Plan:   Problem List Items Addressed This Visit      Other   Wound of right leg - Primary    Patient is a 64 year old female who is following up with  right lower extremity wound.  Wound is gradually healing.  Skin around wound is erythematous, no signs or symptoms of infection.  Wound bed is healing appropriately.  Wound measure 0.5 x 0.5 cm. Provided education to patient with printed handouts given. Patient continues to clean and dress wound on a daily basis. Patient on antibiotic and will complete dose on Friday. Follow-up in 1 week.           Follow-up: Return in about 1 week (around 01/14/2020), or if symptoms worsen or fail to improve.    Ivy Lynn, NP

## 2020-01-07 NOTE — Assessment & Plan Note (Signed)
Patient is a 64 year old female who is following up with her right lower extremity wound.  Wound is gradually healing.  Skin around wound is erythematous, no signs or symptoms of infection.  Wound bed is healing appropriately.  Wound measure 0.5 x 0.5 cm. Provided education to patient with printed handouts given. Patient continues to clean and dress wound on a daily basis. Patient on antibiotic and will complete dose on Friday. Follow-up in 1 week.

## 2020-01-07 NOTE — Patient Instructions (Signed)
Wound Care, Adult Taking care of your wound properly can help to prevent pain, infection, and scarring. It can also help your wound to heal more quickly. How to care for your wound Wound care      Follow instructions from your health care provider about how to take care of your wound. Make sure you: ? Wash your hands with soap and water before you change the bandage (dressing). If soap and water are not available, use hand sanitizer. ? Change your dressing as told by your health care provider. ? Leave stitches (sutures), skin glue, or adhesive strips in place. These skin closures may need to stay in place for 2 weeks or longer. If adhesive strip edges start to loosen and curl up, you may trim the loose edges. Do not remove adhesive strips completely unless your health care provider tells you to do that.  Check your wound area every day for signs of infection. Check for: ? Redness, swelling, or pain. ? Fluid or blood. ? Warmth. ? Pus or a bad smell.  Ask your health care provider if you should clean the wound with mild soap and water. Doing this may include: ? Using a clean towel to pat the wound dry after cleaning it. Do not rub or scrub the wound. ? Applying a cream or ointment. Do this only as told by your health care provider. ? Covering the incision with a clean dressing.  Ask your health care provider when you can leave the wound uncovered.  Keep the dressing dry until your health care provider says it can be removed. Do not take baths, swim, use a hot tub, or do anything that would put the wound underwater until your health care provider approves. Ask your health care provider if you can take showers. You may only be allowed to take sponge baths. Medicines   If you were prescribed an antibiotic medicine, cream, or ointment, take or use the antibiotic as told by your health care provider. Do not stop taking or using the antibiotic even if your condition improves.  Take  over-the-counter and prescription medicines only as told by your health care provider. If you were prescribed pain medicine, take it 30 or more minutes before you do any wound care or as told by your health care provider. General instructions  Return to your normal activities as told by your health care provider. Ask your health care provider what activities are safe.  Do not scratch or pick at the wound.  Do not use any products that contain nicotine or tobacco, such as cigarettes and e-cigarettes. These may delay wound healing. If you need help quitting, ask your health care provider.  Keep all follow-up visits as told by your health care provider. This is important.  Eat a diet that includes protein, vitamin A, vitamin C, and other nutrient-rich foods to help the wound heal. ? Foods rich in protein include meat, dairy, beans, nuts, and other sources. ? Foods rich in vitamin A include carrots and dark green, leafy vegetables. ? Foods rich in vitamin C include citrus, tomatoes, and other fruits and vegetables. ? Nutrient-rich foods have protein, carbohydrates, fat, vitamins, or minerals. Eat a variety of healthy foods including vegetables, fruits, and whole grains. Contact a health care provider if:  You received a tetanus shot and you have swelling, severe pain, redness, or bleeding at the injection site.  Your pain is not controlled with medicine.  You have redness, swelling, or pain around the wound.    You have fluid or blood coming from the wound.  Your wound feels warm to the touch.  You have pus or a bad smell coming from the wound.  You have a fever or chills.  You are nauseous or you vomit.  You are dizzy. Get help right away if:  You have a red streak going away from your wound.  The edges of the wound open up and separate.  Your wound is bleeding, and the bleeding does not stop with gentle pressure.  You have a rash.  You faint.  You have trouble  breathing. Summary  Always wash your hands with soap and water before changing your bandage (dressing).  To help with healing, eat foods that are rich in protein, vitamin A, vitamin C, and other nutrients.  Check your wound every day for signs of infection. Contact your health care provider if you suspect that your wound is infected. This information is not intended to replace advice given to you by your health care provider. Make sure you discuss any questions you have with your health care provider. Document Revised: 06/23/2018 Document Reviewed: 09/20/2015 Elsevier Patient Education  2020 Elsevier Inc.  

## 2020-01-14 ENCOUNTER — Other Ambulatory Visit: Payer: Self-pay | Admitting: Family Medicine

## 2020-01-14 DIAGNOSIS — I10 Essential (primary) hypertension: Secondary | ICD-10-CM

## 2020-01-18 ENCOUNTER — Other Ambulatory Visit: Payer: Self-pay

## 2020-01-18 ENCOUNTER — Encounter: Payer: Self-pay | Admitting: Family Medicine

## 2020-01-18 ENCOUNTER — Ambulatory Visit (INDEPENDENT_AMBULATORY_CARE_PROVIDER_SITE_OTHER): Payer: 59 | Admitting: Family Medicine

## 2020-01-18 VITALS — BP 175/75 | HR 64 | Temp 97.2°F | Ht 64.0 in | Wt 198.0 lb

## 2020-01-18 DIAGNOSIS — M549 Dorsalgia, unspecified: Secondary | ICD-10-CM

## 2020-01-18 DIAGNOSIS — G8929 Other chronic pain: Secondary | ICD-10-CM

## 2020-01-18 DIAGNOSIS — I10 Essential (primary) hypertension: Secondary | ICD-10-CM

## 2020-01-18 DIAGNOSIS — R300 Dysuria: Secondary | ICD-10-CM

## 2020-01-18 DIAGNOSIS — E119 Type 2 diabetes mellitus without complications: Secondary | ICD-10-CM

## 2020-01-18 DIAGNOSIS — M47818 Spondylosis without myelopathy or radiculopathy, sacral and sacrococcygeal region: Secondary | ICD-10-CM

## 2020-01-18 LAB — URINALYSIS, COMPLETE
Bilirubin, UA: NEGATIVE
Glucose, UA: NEGATIVE
Ketones, UA: NEGATIVE
Leukocytes,UA: NEGATIVE
Nitrite, UA: NEGATIVE
Specific Gravity, UA: 1.015 (ref 1.005–1.030)
Urobilinogen, Ur: 0.2 mg/dL (ref 0.2–1.0)
pH, UA: 5.5 (ref 5.0–7.5)

## 2020-01-18 LAB — BAYER DCA HB A1C WAIVED: HB A1C (BAYER DCA - WAIVED): 7.3 % — ABNORMAL HIGH (ref <7.0)

## 2020-01-18 LAB — MICROSCOPIC EXAMINATION: RBC, Urine: NONE SEEN /hpf (ref 0–2)

## 2020-01-18 MED ORDER — LISINOPRIL 20 MG PO TABS: 20.0000 mg | ORAL_TABLET | Freq: Every day | ORAL | 3 refills | Status: DC

## 2020-01-18 MED ORDER — METHYLPREDNISOLONE ACETATE 80 MG/ML IJ SUSP
80.0000 mg | Freq: Once | INTRAMUSCULAR | Status: AC
  Administered 2020-01-18: 80 mg via INTRAMUSCULAR

## 2020-01-18 MED ORDER — SITAGLIPTIN PHOSPHATE 50 MG PO TABS: 50.0000 mg | ORAL_TABLET | Freq: Every day | ORAL | 3 refills | Status: DC

## 2020-01-18 MED ORDER — OXYCODONE-ACETAMINOPHEN 5-325 MG PO TABS: 1.0000 | ORAL_TABLET | Freq: Three times a day (TID) | ORAL | 0 refills | Status: DC | PRN

## 2020-01-18 NOTE — Progress Notes (Signed)
BP (!) 175/75   Pulse 64   Temp (!) 97.2 F (36.2 C)   Ht 5' 4"  (1.626 m)   Wt 198 lb (89.8 kg)   SpO2 99%   BMI 33.99 kg/m    Subjective:   Patient ID: Kimberly Donaldson, female    DOB: [birth year], 64 y.o.   MRN: [id number]  HPI: Kimberly Donaldson is a 64 y.o. female presenting on 01/18/2020 for Medical Management of Chronic Issues   HPI Type 2 diabetes mellitus Patient comes in today for recheck of his diabetes. Patient has been currently taking no medication currently, A1c 7.3. Patient is currently on an ACE inhibitor/ARB. Patient has not seen an ophthalmologist this year. Patient denies any issues with their feet. The symptom started onset as an adult hypertension ARE RELATED TO DM   Hypertension Patient is currently on lisinopril, and their blood pressure today is 175/75. Patient denies any lightheadedness or dizziness. Patient denies headaches, blurred vision, chest pains, shortness of breath, or weakness. Denies any side effects from medication and is content with current medication.   Is coming in for wound recheck on her left lower extremity.  She has been using Santyl on it every day.  She keeps it covered otherwise.  It has been draining.  She does feel like it is improving.  Patient says she has been having a little bit of an abnormal feeling which is what she gets when she gets a UTI, she says is not quite burning but is just an abnormal sensation when she urinates.  She denies frequency or blood in her urine.  Relevant past medical, surgical, family and social history reviewed and updated as indicated. Interim medical history since our last visit reviewed. Allergies and medications reviewed and updated.  Review of Systems  Constitutional: Negative for chills and fever.  Eyes: Negative for visual disturbance.  Respiratory: Negative for chest tightness and shortness of breath.   Cardiovascular: Negative for chest pain and leg swelling.  Genitourinary: Negative for  difficulty urinating, dysuria, flank pain, frequency, hematuria and urgency.  Musculoskeletal: Negative for back pain and gait problem.  Skin: Positive for wound. Negative for rash.  Neurological: Negative for light-headedness and headaches.  Psychiatric/Behavioral: Negative for agitation and behavioral problems.  All other systems reviewed and are negative.   Per HPI unless specifically indicated above   Allergies as of 01/18/2020      Reactions   Codeine    REACTION: ringing in ears   Morphine    REACTION: hives   Other    sporinof - for nail fungus      Medication List       Accurate as of January 18, 2020  1:52 PM. If you have any questions, ask your nurse or doctor.        STOP taking these medications   cephALEXin 500 MG capsule Commonly known as: Keflex Stopped by: Fransisca Kaufmann Gunter Conde, MD     TAKE these medications   aspirin EC 81 MG tablet Take 81 mg by mouth daily. Swallow whole.   collagenase ointment Commonly known as: SANTYL Apply 1 application topically daily.   cyclobenzaprine 10 MG tablet Commonly known as: FLEXERIL TAKE 1 TABLET THREE TIMES DAILY AS NEEDED FOR MUSCLE SPASM   ibuprofen 200 MG tablet Commonly known as: ADVIL Take 800 mg by mouth every 6 (six) hours as needed for moderate pain.   lisinopril 20 MG tablet Commonly known as: ZESTRIL Take 1 tablet (20 mg total) by  mouth daily.   oxyCODONE-acetaminophen 5-325 MG tablet Commonly known as: Roxicet Take 1 tablet by mouth every 8 (eight) hours as needed for severe pain.   sitaGLIPtin 50 MG tablet Commonly known as: Januvia Take 1 tablet (50 mg total) by mouth daily. Started by: Fransisca Kaufmann Gladis Soley, MD        Objective:   BP (!) 175/75   Pulse 64   Temp (!) 97.2 F (36.2 C)   Ht 5' 4"  (1.626 m)   Wt 198 lb (89.8 kg)   SpO2 99%   BMI 33.99 kg/m   Wt Readings from Last 3 Encounters:  01/18/20 198 lb (89.8 kg)  01/07/20 198 lb 12.8 oz (90.2 kg)  12/31/19 195 lb 3.2 oz  (88.5 kg)    Physical Exam Vitals and nursing note reviewed.  Constitutional:      General: She is not in acute distress.    Appearance: She is well-developed. She is not diaphoretic.  Eyes:     Conjunctiva/sclera: Conjunctivae normal.  Cardiovascular:     Rate and Rhythm: Normal rate and regular rhythm.     Heart sounds: Normal heart sounds. No murmur heard.   Pulmonary:     Effort: Pulmonary effort is normal. No respiratory distress.     Breath sounds: Normal breath sounds. No wheezing.  Abdominal:     General: Bowel sounds are normal. There is no distension.     Palpations: Abdomen is soft. Abdomen is not rigid. There is no mass.     Tenderness: There is no abdominal tenderness. There is no guarding or rebound.  Musculoskeletal:        General: No tenderness. Normal range of motion.  Skin:    General: Skin is warm and dry.     Findings: Wound present. No erythema or rash.       Neurological:     Mental Status: She is alert and oriented to person, place, and time.     Coordination: Coordination normal.  Psychiatric:        Behavior: Behavior normal.       Assessment & Plan:   Problem List Items Addressed This Visit      Cardiovascular and Mediastinum   HTN (hypertension)   Relevant Medications   lisinopril (ZESTRIL) 20 MG tablet   Other Relevant Orders   CMP14+EGFR     Endocrine   DM2 (diabetes mellitus, type 2) ([place]) - Primary   Relevant Medications   sitaGLIPtin (JANUVIA) 50 MG tablet   lisinopril (ZESTRIL) 20 MG tablet   Other Relevant Orders   Bayer DCA Hb A1c Waived   CBC with Differential/Platelet   Lipid panel     Other   Chronic back pain   Relevant Medications   oxyCODONE-acetaminophen (ROXICET) 5-325 MG tablet   Other Relevant Orders   ToxASSURE Select 13 (MW), Urine    Other Visit Diagnoses    Essential hypertension       Relevant Medications   lisinopril (ZESTRIL) 20 MG tablet   Other Relevant Orders   CMP14+EGFR   SI joint  arthritis       Relevant Medications   oxyCODONE-acetaminophen (ROXICET) 5-325 MG tablet   methylPREDNISolone acetate (DEPO-MEDROL) injection 80 mg (Completed)   Dysuria       Relevant Orders   Urinalysis, Complete   Urine Culture      Will do steroid injection for SI joint issues, will do oxycodone for her chronic low back pain.  Will start [place] for her diabetes,  A1c is 7.3. Follow up plan: Return if symptoms worsen or fail to improve, for 2-week wound recheck.  Counseling provided for all of the vaccine components Orders Placed This Encounter  Procedures  . Urine Culture  . Bayer DCA Hb A1c Waived  . CBC with Differential/Platelet  . CMP14+EGFR  . Lipid panel  . ToxASSURE Select 13 (MW), Urine  . Urinalysis, Complete    Caryl Pina, MD [facility] 01/18/2020, 1:52 PM

## 2020-01-19 LAB — LIPID PANEL
Chol/HDL Ratio: 3 ratio (ref 0.0–4.4)
Cholesterol, Total: 144 mg/dL (ref 100–199)
HDL: 48 mg/dL (ref >39)
LDL Chol Calc (NIH): 71 mg/dL (ref 0–99)
Triglycerides: 141 mg/dL (ref 0–149)
VLDL Cholesterol Cal: 25 mg/dL (ref 5–40)

## 2020-01-19 LAB — CBC WITH DIFFERENTIAL/PLATELET
Basophils Absolute: 0.1 10*3/uL (ref 0.0–0.2)
Basos: 1 %
EOS (ABSOLUTE): 0.2 10*3/uL (ref 0.0–0.4)
Eos: 2 %
Hematocrit: 46.4 % (ref 34.0–46.6)
Hemoglobin: 15.4 g/dL (ref 11.1–15.9)
Immature Grans (Abs): 0 10*3/uL (ref 0.0–0.1)
Immature Granulocytes: 0 %
Lymphocytes Absolute: 3.7 10*3/uL — ABNORMAL HIGH (ref 0.7–3.1)
Lymphs: 32 %
MCH: 29.1 pg (ref 26.6–33.0)
MCHC: 33.2 g/dL (ref 31.5–35.7)
MCV: 88 fL (ref 79–97)
Monocytes Absolute: 0.7 10*3/uL (ref 0.1–0.9)
Monocytes: 6 %
Neutrophils Absolute: 7 10*3/uL (ref 1.4–7.0)
Neutrophils: 59 %
Platelets: 299 10*3/uL (ref 150–450)
RBC: 5.29 x10E6/uL — ABNORMAL HIGH (ref 3.77–5.28)
RDW: 13.1 % (ref 11.7–15.4)
WBC: 11.8 10*3/uL — ABNORMAL HIGH (ref 3.4–10.8)

## 2020-01-19 LAB — CMP14+EGFR
ALT: 24 IU/L (ref 0–32)
AST: 16 IU/L (ref 0–40)
Albumin/Globulin Ratio: 1.5 (ref 1.2–2.2)
Albumin: 4.2 g/dL (ref 3.8–4.8)
Alkaline Phosphatase: 110 IU/L (ref 44–121)
BUN/Creatinine Ratio: 16 (ref 12–28)
BUN: 10 mg/dL (ref 8–27)
Bilirubin Total: 0.4 mg/dL (ref 0.0–1.2)
CO2: 25 mmol/L (ref 20–29)
Calcium: 9 mg/dL (ref 8.7–10.3)
Chloride: 100 mmol/L (ref 96–106)
Creatinine, Ser: 0.62 mg/dL (ref 0.57–1.00)
GFR calc Af Amer: 110 mL/min/{1.73_m2} (ref >59)
GFR calc non Af Amer: 96 mL/min/{1.73_m2} (ref >59)
Globulin, Total: 2.8 g/dL (ref 1.5–4.5)
Glucose: 142 mg/dL — ABNORMAL HIGH (ref 65–99)
Potassium: 4.2 mmol/L (ref 3.5–5.2)
Sodium: 138 mmol/L (ref 134–144)
Total Protein: 7 g/dL (ref 6.0–8.5)

## 2020-01-20 ENCOUNTER — Telehealth: Payer: Self-pay

## 2020-01-20 LAB — URINE CULTURE

## 2020-01-20 NOTE — Telephone Encounter (Signed)
I did not see any signs of infection on the urine, she continues to have worsening symptoms then we may have to have her come back in and recheck in a week or so a urine with a visit, or she may need to go back and see urology.

## 2020-01-20 NOTE — Telephone Encounter (Signed)
Were you going to send in rx for UTI. Looks like urine was normal per result note for Mondays visit. Please advise?

## 2020-01-20 NOTE — Telephone Encounter (Signed)
Patient aware and verbalized understanding. °

## 2020-01-21 LAB — TOXASSURE SELECT 13 (MW), URINE

## 2020-02-01 ENCOUNTER — Ambulatory Visit (INDEPENDENT_AMBULATORY_CARE_PROVIDER_SITE_OTHER): Payer: 59 | Admitting: Nurse Practitioner

## 2020-02-01 ENCOUNTER — Other Ambulatory Visit: Payer: Self-pay

## 2020-02-01 ENCOUNTER — Encounter: Payer: Self-pay | Admitting: Nurse Practitioner

## 2020-02-01 VITALS — BP 165/70 | HR 61 | Temp 98.2°F | Resp 20 | Ht 64.0 in | Wt 195.0 lb

## 2020-02-01 DIAGNOSIS — S81801D Unspecified open wound, right lower leg, subsequent encounter: Secondary | ICD-10-CM | POA: Diagnosis not present

## 2020-02-01 MED ORDER — CEPHALEXIN 500 MG PO CAPS: 500.0000 mg | ORAL_CAPSULE | Freq: Four times a day (QID) | ORAL | 0 refills | Status: DC

## 2020-02-01 NOTE — Patient Instructions (Signed)
Wound progressively healing, continue dressing 1 every day, watching out for signs and symptoms of infection.  Follow-up in 2 weeks.   Wound Care, Adult Taking care of your wound properly can help to prevent pain, infection, and scarring. It can also help your wound to heal more quickly. How to care for your wound Wound care      Follow instructions from your health care provider about how to take care of your wound. Make sure you: ? Wash your hands with soap and water before you change the bandage (dressing). If soap and water are not available, use hand sanitizer. ? Change your dressing as told by your health care provider. ? Leave stitches (sutures), skin glue, or adhesive strips in place. These skin closures may need to stay in place for 2 weeks or longer. If adhesive strip edges start to loosen and curl up, you may trim the loose edges. Do not remove adhesive strips completely unless your health care provider tells you to do that.  Check your wound area every day for signs of infection. Check for: ? Redness, swelling, or pain. ? Fluid or blood. ? Warmth. ? Pus or a bad smell.  Ask your health care provider if you should clean the wound with mild soap and water. Doing this may include: ? Using a clean towel to pat the wound dry after cleaning it. Do not rub or scrub the wound. ? Applying a cream or ointment. Do this only as told by your health care provider. ? Covering the incision with a clean dressing.  Ask your health care provider when you can leave the wound uncovered.  Keep the dressing dry until your health care provider says it can be removed. Do not take baths, swim, use a hot tub, or do anything that would put the wound underwater until your health care provider approves. Ask your health care provider if you can take showers. You may only be allowed to take sponge baths. Medicines   If you were prescribed an antibiotic medicine, cream, or ointment, take or use the  antibiotic as told by your health care provider. Do not stop taking or using the antibiotic even if your condition improves.  Take over-the-counter and prescription medicines only as told by your health care provider. If you were prescribed pain medicine, take it 30 or more minutes before you do any wound care or as told by your health care provider. General instructions  Return to your normal activities as told by your health care provider. Ask your health care provider what activities are safe.  Do not scratch or pick at the wound.  Do not use any products that contain nicotine or tobacco, such as cigarettes and e-cigarettes. These may delay wound healing. If you need help quitting, ask your health care provider.  Keep all follow-up visits as told by your health care provider. This is important.  Eat a diet that includes protein, vitamin A, vitamin C, and other nutrient-rich foods to help the wound heal. ? Foods rich in protein include meat, dairy, beans, nuts, and other sources. ? Foods rich in vitamin A include carrots and dark green, leafy vegetables. ? Foods rich in vitamin C include citrus, tomatoes, and other fruits and vegetables. ? Nutrient-rich foods have protein, carbohydrates, fat, vitamins, or minerals. Eat a variety of healthy foods including vegetables, fruits, and whole grains. Contact a health care provider if:  You received a tetanus shot and you have swelling, severe pain, redness, or bleeding  at the injection site.  Your pain is not controlled with medicine.  You have redness, swelling, or pain around the wound.  You have fluid or blood coming from the wound.  Your wound feels warm to the touch.  You have pus or a bad smell coming from the wound.  You have a fever or chills.  You are nauseous or you vomit.  You are dizzy. Get help right away if:  You have a red streak going away from your wound.  The edges of the wound open up and separate.  Your wound  is bleeding, and the bleeding does not stop with gentle pressure.  You have a rash.  You faint.  You have trouble breathing. Summary  Always wash your hands with soap and water before changing your bandage (dressing).  To help with healing, eat foods that are rich in protein, vitamin A, vitamin C, and other nutrients.  Check your wound every day for signs of infection. Contact your health care provider if you suspect that your wound is infected. This information is not intended to replace advice given to you by your health care provider. Make sure you discuss any questions you have with your health care provider. Document Revised: 06/23/2018 Document Reviewed: 09/20/2015 Elsevier Patient Education  [employer].

## 2020-02-01 NOTE — Assessment & Plan Note (Addendum)
Wound of right leg is well managed on current regimen.  Gradually healing.  Basis fresh with no bleeding.  Parameters of wound red with no infection.  Mild sloughing.  Wound bed measures 1 cm x 1 cm.  Pictures taken.  Continue to provide education to patient on signs and symptoms of wound infection, printed handouts given.  Started patient on Keflex 500 mg 4 times daily .  Follow-up in 2 weeks with worsening or unresolved symptoms.

## 2020-02-01 NOTE — Progress Notes (Signed)
Established Patient Office Visit  Subjective:  Patient ID: Kimberly Donaldson, female    DOB: [birth year]  Age: 64 y.o. MRN: [id number]  CC:  Chief Complaint  Patient presents with  . Wound Check    2 week     HPI Kimberly Donaldson presents for follow-up wound check.  Gradually healing no signs or symptoms of infection.  Patient is reporting mild pain around wound parameters,  erythematous and sloughing.  Past Medical History:  Diagnosis Date  . Diabetes mellitus without complication [facility])     Past Surgical History:  Procedure Laterality Date  . CESAREAN SECTION    . FOOT SURGERY    . WRIST SURGERY      Family History  Problem Relation Age of Onset  . Cancer Mother   . Cancer Father   . Diabetes Father     Social History   Socioeconomic History  . Marital status: Married    Spouse name: Not on file  . Number of children: Not on file  . Years of education: Not on file  . Highest education level: Not on file  Occupational History  . Not on file  Tobacco Use  . Smoking status: Current Every Day Smoker    Packs/day: 1.00  . Smokeless tobacco: Never Used  Substance and Sexual Activity  . Alcohol use: No    Alcohol/week: 0.0 standard drinks  . Drug use: No  . Sexual activity: Not on file  Other Topics Concern  . Not on file  Social History Narrative  . Not on file   Social Determinants of Health   Financial Resource Strain:   . Difficulty of Paying Living Expenses: Not on file  Food Insecurity:   . Worried About Charity fundraiser in the Last Year: Not on file  . Ran Out of Food in the Last Year: Not on file  Transportation Needs:   . Lack of Transportation (Medical): Not on file  . Lack of Transportation (Non-Medical): Not on file  Physical Activity:   . Days of Exercise per Week: Not on file  . Minutes of Exercise per Session: Not on file  Stress:   . Feeling of Stress : Not on file  Social Connections:   . Frequency of Communication with Friends and  Family: Not on file  . Frequency of Social Gatherings with Friends and Family: Not on file  . Attends Religious Services: Not on file  . Active Member of Clubs or Organizations: Not on file  . Attends Archivist Meetings: Not on file  . Marital Status: Not on file  Intimate Partner Violence:   . Fear of Current or Ex-Partner: Not on file  . Emotionally Abused: Not on file  . Physically Abused: Not on file  . Sexually Abused: Not on file    Outpatient Medications Prior to Visit  Medication Sig Dispense Refill  . aspirin EC 81 MG tablet Take 81 mg by mouth daily. Swallow whole.    . collagenase (SANTYL) ointment Apply 1 application topically daily. 30 g 1  . cyclobenzaprine (FLEXERIL) 10 MG tablet TAKE 1 TABLET THREE TIMES DAILY AS NEEDED FOR MUSCLE SPASM 60 tablet 0  . ibuprofen (ADVIL,MOTRIN) 200 MG tablet Take 800 mg by mouth every 6 (six) hours as needed for moderate pain.    [name] lisinopril (ZESTRIL) 20 MG tablet Take 1 tablet (20 mg total) by mouth daily. 90 tablet 3  . oxyCODONE-acetaminophen (ROXICET) 5-325 MG tablet Take 1 tablet  by mouth every 8 (eight) hours as needed for severe pain. 60 tablet 0  . sitaGLIPtin (JANUVIA) 50 MG tablet Take 1 tablet (50 mg total) by mouth daily. 90 tablet 3   No facility-administered medications prior to visit.    Allergies  Allergen Reactions  . Codeine     REACTION: ringing in ears  . Morphine     REACTION: hives  . Other     sporinof - for nail fungus    ROS Review of Systems  Skin: Positive for color change and wound.  All other systems reviewed and are negative.     Objective:    Physical Exam Vitals reviewed.  Constitutional:      Appearance: Normal appearance.  HENT:     Head: Normocephalic.  Eyes:     Conjunctiva/sclera: Conjunctivae normal.  Cardiovascular:     Rate and Rhythm: Normal rate and regular rhythm.     Pulses: Normal pulses.     Heart sounds: Normal heart sounds.  Pulmonary:     Effort:  Pulmonary effort is normal.     Breath sounds: Normal breath sounds.  Musculoskeletal:        General: Tenderness present.     Comments: Right calf around wound area  Skin:    General: Skin is warm.     Findings: Erythema present.     Comments: Wound  Neurological:     Mental Status: She is alert and oriented to person, place, and time.     BP (!) 165/70 (BP Location: Left Arm, Cuff Size: Large)   Pulse 61   Temp 98.2 F (36.8 C)   Resp 20   Ht 5\' 4"  (1.626 m)   Wt 195 lb (88.5 kg)   SpO2 100%   BMI 33.47 kg/m  Wt Readings from Last 3 Encounters:  02/01/20 195 lb (88.5 kg)  01/18/20 198 lb (89.8 kg)  01/07/20 198 lb 12.8 oz (90.2 kg)     Health Maintenance Due  Topic Date Due  . Hepatitis C Screening  Never done  . FOOT EXAM  Never done  . HIV Screening  Never done  . TETANUS/TDAP  Never done  . OPHTHALMOLOGY EXAM  09/14/2016  . MAMMOGRAM  09/11/2017  . COLONOSCOPY  11/10/2017  . PAP SMEAR-Modifier  06/23/2018    There are no preventive care reminders to display for this patient.  No results found for: TSH Lab Results  Component Value Date   WBC 11.8 (H) 01/18/2020   HGB 15.4 01/18/2020   HCT 46.4 01/18/2020   MCV 88 01/18/2020   PLT 299 01/18/2020   Lab Results  Component Value Date   NA 138 01/18/2020   K 4.2 01/18/2020   CO2 25 01/18/2020   GLUCOSE 142 (H) 01/18/2020   BUN 10 01/18/2020   CREATININE 0.62 01/18/2020   BILITOT 0.4 01/18/2020   ALKPHOS 110 01/18/2020   AST 16 01/18/2020   ALT 24 01/18/2020   PROT 7.0 01/18/2020   ALBUMIN 4.2 01/18/2020   CALCIUM 9.0 01/18/2020   ANIONGAP 9 05/19/2014   Lab Results  Component Value Date   CHOL 144 01/18/2020   Lab Results  Component Value Date   HDL 48 01/18/2020   Lab Results  Component Value Date   LDLCALC 71 01/18/2020   Lab Results  Component Value Date   TRIG 141 01/18/2020   Lab Results  Component Value Date   CHOLHDL 3.0 01/18/2020   Lab Results  Component Value Date  HGBA1C 7.3 (H) 01/18/2020      Assessment & Plan:   Problem List Items Addressed This Visit      Other   Wound of right leg - Primary    Wound of right leg is well managed on current regimen.  Gradually healing.  Basis fresh with no bleeding.  Parameters of wound red with no infection.  Mild sloughing.  Wound bed measures 1 cm x 1 cm.  Pictures taken.  Continue to provide education to patient on signs and symptoms of wound infection, printed handouts given.  Started patient on Keflex 500 mg 4 times daily .  Follow-up in 2 weeks with worsening or unresolved symptoms.      Relevant Medications   cephALEXin (KEFLEX) 500 MG capsule      Meds ordered this encounter  Medications  . cephALEXin (KEFLEX) 500 MG capsule    Sig: Take 1 capsule (500 mg total) by mouth 4 (four) times daily.    Dispense:  40 capsule    Refill:  0    Order Specific Question:   Supervising Provider    Answer:   Caryl Pina A [[id number]]    Follow-up: Return in about 2 weeks (around 02/15/2020).    Ivy Lynn, NP

## 2020-02-26 ENCOUNTER — Other Ambulatory Visit: Payer: Self-pay

## 2020-02-26 ENCOUNTER — Ambulatory Visit (INDEPENDENT_AMBULATORY_CARE_PROVIDER_SITE_OTHER): Payer: 59 | Admitting: Nurse Practitioner

## 2020-02-26 ENCOUNTER — Encounter: Payer: Self-pay | Admitting: Nurse Practitioner

## 2020-02-26 VITALS — BP 148/62 | HR 59 | Temp 97.7°F | Ht 64.0 in | Wt 196.6 lb

## 2020-02-26 DIAGNOSIS — S81801D Unspecified open wound, right lower leg, subsequent encounter: Secondary | ICD-10-CM

## 2020-02-26 NOTE — Assessment & Plan Note (Signed)
Patient is following up for wound of the right leg.  Wound is healing appropriately. no signs or symptoms of infection, wound base is clean, skin around wound is slightly red but not warm to touch.  Wound cleaned and dressed.  Follow-up in 2 weeks.

## 2020-02-26 NOTE — Progress Notes (Signed)
Established Patient Office Visit  Subjective:  Patient ID: Kimberly Donaldson, female    DOB: Apr 16, 1955  Age: 64 y.o. MRN: [id number]  CC:  Chief Complaint  Patient presents with  . Wound Check    HPI Kimberly Donaldson is a 64 year old female who is following up for wound of the right leg.  Patient is not reporting any pain, nausea, vomiting or fever fever.  No signs or symptoms of infection.  Patient is compliant with treatment and following up as directed.  Past Medical History:  Diagnosis Date  . Diabetes mellitus without complication [facility])     Past Surgical History:  Procedure Laterality Date  . CESAREAN SECTION    . FOOT SURGERY    . WRIST SURGERY      Family History  Problem Relation Age of Onset  . Cancer Mother   . Cancer Father   . Diabetes Father     Social History   Socioeconomic History  . Marital status: Married    Spouse name: Not on file  . Number of children: Not on file  . Years of education: Not on file  . Highest education level: Not on file  Occupational History  . Not on file  Tobacco Use  . Smoking status: Current Every Day Smoker    Packs/day: 1.00  . Smokeless tobacco: Never Used  Substance and Sexual Activity  . Alcohol use: No    Alcohol/week: 0.0 standard drinks  . Drug use: No  . Sexual activity: Not on file  Other Topics Concern  . Not on file  Social History Narrative  . Not on file   Social Determinants of Health   Financial Resource Strain: Not on file  Food Insecurity: Not on file  Transportation Needs: Not on file  Physical Activity: Not on file  Stress: Not on file  Social Connections: Not on file  Intimate Partner Violence: Not on file    Outpatient Medications Prior to Visit  Medication Sig Dispense Refill  . aspirin EC 81 MG tablet Take 81 mg by mouth daily. Swallow whole.    . cephALEXin (KEFLEX) 500 MG capsule Take 1 capsule (500 mg total) by mouth 4 (four) times daily. 40 capsule 0  . collagenase (SANTYL)  ointment Apply 1 application topically daily. 30 g 1  . cyclobenzaprine (FLEXERIL) 10 MG tablet TAKE 1 TABLET THREE TIMES DAILY AS NEEDED FOR MUSCLE SPASM 60 tablet 0  . ibuprofen (ADVIL,MOTRIN) 200 MG tablet Take 800 mg by mouth every 6 (six) hours as needed for moderate pain.    [name] lisinopril (ZESTRIL) 20 MG tablet Take 1 tablet (20 mg total) by mouth daily. 90 tablet 3  . oxyCODONE-acetaminophen (ROXICET) 5-325 MG tablet Take 1 tablet by mouth every 8 (eight) hours as needed for severe pain. 60 tablet 0  . sitaGLIPtin (JANUVIA) 50 MG tablet Take 1 tablet (50 mg total) by mouth daily. 90 tablet 3   No facility-administered medications prior to visit.    Allergies  Allergen Reactions  . Codeine     REACTION: ringing in ears  . Morphine     REACTION: hives  . Other     sporinof - for nail fungus    ROS Review of Systems  Skin: Positive for color change and wound.  All other systems reviewed and are negative.     Objective:    Physical Exam Constitutional:      Appearance: Normal appearance.  HENT:     Head: Normocephalic.  Eyes:     Conjunctiva/sclera: Conjunctivae normal.  Cardiovascular:     Rate and Rhythm: Normal rate.     Pulses: Normal pulses.     Heart sounds: Normal heart sounds.  Pulmonary:     Effort: Pulmonary effort is normal.     Breath sounds: Normal breath sounds.  Musculoskeletal:        General: Tenderness present.  Skin:    General: Skin is warm.     Findings: Erythema present.     Comments: Right leg wound  Neurological:     Mental Status: She is alert and oriented to person, place, and time.     BP (!) 148/62   Pulse (!) 59   Temp 97.7 F (36.5 C)   Ht 5\' 4"  (1.626 m)   Wt 196 lb 9.6 oz (89.2 kg)   SpO2 98%   BMI 33.75 kg/m  Wt Readings from Last 3 Encounters:  02/26/20 196 lb 9.6 oz (89.2 kg)  02/01/20 195 lb (88.5 kg)  01/18/20 198 lb (89.8 kg)     Health Maintenance Due  Topic Date Due  . Hepatitis C Screening  Never done   . FOOT EXAM  Never done  . COVID-19 Vaccine (1) Never done  . HIV Screening  Never done  . TETANUS/TDAP  Never done  . OPHTHALMOLOGY EXAM  09/14/2016  . MAMMOGRAM  09/11/2017  . COLONOSCOPY  11/10/2017  . PAP SMEAR-Modifier  06/23/2018    There are no preventive care reminders to display for this patient.  No results found for: TSH Lab Results  Component Value Date   WBC 11.8 (H) 01/18/2020   HGB 15.4 01/18/2020   HCT 46.4 01/18/2020   MCV 88 01/18/2020   PLT 299 01/18/2020   Lab Results  Component Value Date   NA 138 01/18/2020   K 4.2 01/18/2020   CO2 25 01/18/2020   GLUCOSE 142 (H) 01/18/2020   BUN 10 01/18/2020   CREATININE 0.62 01/18/2020   BILITOT 0.4 01/18/2020   ALKPHOS 110 01/18/2020   AST 16 01/18/2020   ALT 24 01/18/2020   PROT 7.0 01/18/2020   ALBUMIN 4.2 01/18/2020   CALCIUM 9.0 01/18/2020   ANIONGAP 9 05/19/2014   Lab Results  Component Value Date   CHOL 144 01/18/2020   Lab Results  Component Value Date   HDL 48 01/18/2020   Lab Results  Component Value Date   LDLCALC 71 01/18/2020   Lab Results  Component Value Date   TRIG 141 01/18/2020   Lab Results  Component Value Date   CHOLHDL 3.0 01/18/2020   Lab Results  Component Value Date   HGBA1C 7.3 (H) 01/18/2020      Assessment & Plan:   Problem List Items Addressed This Visit      Other   Wound of right leg - Primary    Patient is following up for wound of the right leg.  Wound is healing appropriately. no signs or symptoms of infection, wound base is clean, skin around wound is slightly red but not warm to touch.  Wound cleaned and dressed.  Follow-up in 2 weeks.          Follow-up: Return in about 2 weeks (around 03/11/2020).    Ivy Lynn, NP

## 2020-02-26 NOTE — Patient Instructions (Signed)
Wound Care, Adult Taking care of your wound properly can help to prevent pain, infection, and scarring. It can also help your wound to heal more quickly. How to care for your wound Wound care      Follow instructions from your health care provider about how to take care of your wound. Make sure you: ? Wash your hands with soap and water before you change the bandage (dressing). If soap and water are not available, use hand sanitizer. ? Change your dressing as told by your health care provider. ? Leave stitches (sutures), skin glue, or adhesive strips in place. These skin closures may need to stay in place for 2 weeks or longer. If adhesive strip edges start to loosen and curl up, you may trim the loose edges. Do not remove adhesive strips completely unless your health care provider tells you to do that.  Check your wound area every day for signs of infection. Check for: ? Redness, swelling, or pain. ? Fluid or blood. ? Warmth. ? Pus or a bad smell.  Ask your health care provider if you should clean the wound with mild soap and water. Doing this may include: ? Using a clean towel to pat the wound dry after cleaning it. Do not rub or scrub the wound. ? Applying a cream or ointment. Do this only as told by your health care provider. ? Covering the incision with a clean dressing.  Ask your health care provider when you can leave the wound uncovered.  Keep the dressing dry until your health care provider says it can be removed. Do not take baths, swim, use a hot tub, or do anything that would put the wound underwater until your health care provider approves. Ask your health care provider if you can take showers. You may only be allowed to take sponge baths. Medicines   If you were prescribed an antibiotic medicine, cream, or ointment, take or use the antibiotic as told by your health care provider. Do not stop taking or using the antibiotic even if your condition improves.  Take  over-the-counter and prescription medicines only as told by your health care provider. If you were prescribed pain medicine, take it 30 or more minutes before you do any wound care or as told by your health care provider. General instructions  Return to your normal activities as told by your health care provider. Ask your health care provider what activities are safe.  Do not scratch or pick at the wound.  Do not use any products that contain nicotine or tobacco, such as cigarettes and e-cigarettes. These may delay wound healing. If you need help quitting, ask your health care provider.  Keep all follow-up visits as told by your health care provider. This is important.  Eat a diet that includes protein, vitamin A, vitamin C, and other nutrient-rich foods to help the wound heal. ? Foods rich in protein include meat, dairy, beans, nuts, and other sources. ? Foods rich in vitamin A include carrots and dark green, leafy vegetables. ? Foods rich in vitamin C include citrus, tomatoes, and other fruits and vegetables. ? Nutrient-rich foods have protein, carbohydrates, fat, vitamins, or minerals. Eat a variety of healthy foods including vegetables, fruits, and whole grains. Contact a health care provider if:  You received a tetanus shot and you have swelling, severe pain, redness, or bleeding at the injection site.  Your pain is not controlled with medicine.  You have redness, swelling, or pain around the wound.    You have fluid or blood coming from the wound.  Your wound feels warm to the touch.  You have pus or a bad smell coming from the wound.  You have a fever or chills.  You are nauseous or you vomit.  You are dizzy. Get help right away if:  You have a red streak going away from your wound.  The edges of the wound open up and separate.  Your wound is bleeding, and the bleeding does not stop with gentle pressure.  You have a rash.  You faint.  You have trouble  breathing. Summary  Always wash your hands with soap and water before changing your bandage (dressing).  To help with healing, eat foods that are rich in protein, vitamin A, vitamin C, and other nutrients.  Check your wound every day for signs of infection. Contact your health care provider if you suspect that your wound is infected. This information is not intended to replace advice given to you by your health care provider. Make sure you discuss any questions you have with your health care provider. Document Revised: 06/23/2018 Document Reviewed: 09/20/2015 Elsevier Patient Education  2020 Elsevier Inc.  

## 2020-03-15 ENCOUNTER — Encounter: Payer: Self-pay | Admitting: Nurse Practitioner

## 2020-03-15 ENCOUNTER — Other Ambulatory Visit: Payer: Self-pay

## 2020-03-15 ENCOUNTER — Ambulatory Visit (INDEPENDENT_AMBULATORY_CARE_PROVIDER_SITE_OTHER): Payer: 59 | Admitting: Nurse Practitioner

## 2020-03-15 VITALS — BP 164/73 | HR 67 | Temp 96.7°F | Ht 64.0 in | Wt 201.8 lb

## 2020-03-15 DIAGNOSIS — S81801D Unspecified open wound, right lower leg, subsequent encounter: Secondary | ICD-10-CM

## 2020-03-15 DIAGNOSIS — S81801S Unspecified open wound, right lower leg, sequela: Secondary | ICD-10-CM

## 2020-03-15 NOTE — Patient Instructions (Signed)
Wound Care, Adult Taking care of your wound properly can help to prevent pain, infection, and scarring. It can also help your wound to heal more quickly. How to care for your wound Wound care      Follow instructions from your health care provider about how to take care of your wound. Make sure you: ? Wash your hands with soap and water before you change the bandage (dressing). If soap and water are not available, use hand sanitizer. ? Change your dressing as told by your health care provider. ? Leave stitches (sutures), skin glue, or adhesive strips in place. These skin closures may need to stay in place for 2 weeks or longer. If adhesive strip edges start to loosen and curl up, you may trim the loose edges. Do not remove adhesive strips completely unless your health care provider tells you to do that.  Check your wound area every day for signs of infection. Check for: ? Redness, swelling, or pain. ? Fluid or blood. ? Warmth. ? Pus or a bad smell.  Ask your health care provider if you should clean the wound with mild soap and water. Doing this may include: ? Using a clean towel to pat the wound dry after cleaning it. Do not rub or scrub the wound. ? Applying a cream or ointment. Do this only as told by your health care provider. ? Covering the incision with a clean dressing.  Ask your health care provider when you can leave the wound uncovered.  Keep the dressing dry until your health care provider says it can be removed. Do not take baths, swim, use a hot tub, or do anything that would put the wound underwater until your health care provider approves. Ask your health care provider if you can take showers. You may only be allowed to take sponge baths. Medicines   If you were prescribed an antibiotic medicine, cream, or ointment, take or use the antibiotic as told by your health care provider. Do not stop taking or using the antibiotic even if your condition improves.  Take  over-the-counter and prescription medicines only as told by your health care provider. If you were prescribed pain medicine, take it 30 or more minutes before you do any wound care or as told by your health care provider. General instructions  Return to your normal activities as told by your health care provider. Ask your health care provider what activities are safe.  Do not scratch or pick at the wound.  Do not use any products that contain nicotine or tobacco, such as cigarettes and e-cigarettes. These may delay wound healing. If you need help quitting, ask your health care provider.  Keep all follow-up visits as told by your health care provider. This is important.  Eat a diet that includes protein, vitamin A, vitamin C, and other nutrient-rich foods to help the wound heal. ? Foods rich in protein include meat, dairy, beans, nuts, and other sources. ? Foods rich in vitamin A include carrots and dark green, leafy vegetables. ? Foods rich in vitamin C include citrus, tomatoes, and other fruits and vegetables. ? Nutrient-rich foods have protein, carbohydrates, fat, vitamins, or minerals. Eat a variety of healthy foods including vegetables, fruits, and whole grains. Contact a health care provider if:  You received a tetanus shot and you have swelling, severe pain, redness, or bleeding at the injection site.  Your pain is not controlled with medicine.  You have redness, swelling, or pain around the wound.    You have fluid or blood coming from the wound.  Your wound feels warm to the touch.  You have pus or a bad smell coming from the wound.  You have a fever or chills.  You are nauseous or you vomit.  You are dizzy. Get help right away if:  You have a red streak going away from your wound.  The edges of the wound open up and separate.  Your wound is bleeding, and the bleeding does not stop with gentle pressure.  You have a rash.  You faint.  You have trouble  breathing. Summary  Always wash your hands with soap and water before changing your bandage (dressing).  To help with healing, eat foods that are rich in protein, vitamin A, vitamin C, and other nutrients.  Check your wound every day for signs of infection. Contact your health care provider if you suspect that your wound is infected. This information is not intended to replace advice given to you by your health care provider. Make sure you discuss any questions you have with your health care provider. Document Revised: 06/23/2018 Document Reviewed: 09/20/2015 Elsevier Patient Education  2020 Elsevier Inc.  

## 2020-03-15 NOTE — Progress Notes (Signed)
Established Patient Office Visit  Subjective:  Patient ID: Kimberly Donaldson, female    DOB: [birth year]  Age: 64 y.o. MRN: [id number]  CC:  Chief Complaint  Patient presents with  . Wound Check    Right lower extremity    HPI Kimberly Donaldson presents for wound care follow-up.  Wound is healing as expected.  Skin around wound is slightly erythematous but more improved than last visit.  Skin is not warm to touch, wound base is clean and healthy looking.  Patient is not reporting any pain, fever or signs/symptoms of infection.  Past Medical History:  Diagnosis Date  . Diabetes mellitus without complication [facility])     Past Surgical History:  Procedure Laterality Date  . CESAREAN SECTION    . FOOT SURGERY    . WRIST SURGERY      Family History  Problem Relation Age of Onset  . Cancer Mother   . Cancer Father   . Diabetes Father     Social History   Socioeconomic History  . Marital status: Married    Spouse name: Not on file  . Number of children: Not on file  . Years of education: Not on file  . Highest education level: Not on file  Occupational History  . Not on file  Tobacco Use  . Smoking status: Current Every Day Smoker    Packs/day: 1.00  . Smokeless tobacco: Never Used  Substance and Sexual Activity  . Alcohol use: No    Alcohol/week: 0.0 standard drinks  . Drug use: No  . Sexual activity: Not on file  Other Topics Concern  . Not on file  Social History Narrative  . Not on file   Social Determinants of Health   Financial Resource Strain: Not on file  Food Insecurity: Not on file  Transportation Needs: Not on file  Physical Activity: Not on file  Stress: Not on file  Social Connections: Not on file  Intimate Partner Violence: Not on file    Outpatient Medications Prior to Visit  Medication Sig Dispense Refill  . aspirin EC 81 MG tablet Take 81 mg by mouth daily. Swallow whole.    . collagenase (SANTYL) ointment Apply 1 application topically daily.  30 g 1  . cyclobenzaprine (FLEXERIL) 10 MG tablet TAKE 1 TABLET THREE TIMES DAILY AS NEEDED FOR MUSCLE SPASM 60 tablet 0  . ibuprofen (ADVIL,MOTRIN) 200 MG tablet Take 800 mg by mouth every 6 (six) hours as needed for moderate pain.    [name] lisinopril (ZESTRIL) 20 MG tablet Take 1 tablet (20 mg total) by mouth daily. 90 tablet 3  . oxyCODONE-acetaminophen (ROXICET) 5-325 MG tablet Take 1 tablet by mouth every 8 (eight) hours as needed for severe pain. 60 tablet 0  . sitaGLIPtin (JANUVIA) 50 MG tablet Take 1 tablet (50 mg total) by mouth daily. 90 tablet 3  . cephALEXin (KEFLEX) 500 MG capsule Take 1 capsule (500 mg total) by mouth 4 (four) times daily. 40 capsule 0   No facility-administered medications prior to visit.    Allergies  Allergen Reactions  . Codeine     REACTION: ringing in ears  . Morphine     REACTION: hives  . Other     sporinof - for nail fungus    ROS Review of Systems  Skin: Positive for color change and wound.  All other systems reviewed and are negative.     Objective:    Physical Exam Vitals reviewed.  Constitutional:  Appearance: Normal appearance.  Eyes:     Conjunctiva/sclera: Conjunctivae normal.  Cardiovascular:     Rate and Rhythm: Normal rate and regular rhythm.     Pulses: Normal pulses.  Pulmonary:     Effort: Pulmonary effort is normal.  Musculoskeletal:        General: No tenderness.  Skin:    Findings: Erythema present.     Comments: Right lower extremity wound.  Neurological:     Mental Status: She is alert and oriented to person, place, and time.  Psychiatric:        Mood and Affect: Mood normal.        Behavior: Behavior normal.     BP (!) 164/73   Pulse 67   Temp (!) 96.7 F (35.9 C)   Ht 5\' 4"  (1.626 m)   Wt 201 lb 12.8 oz (91.5 kg)   SpO2 97%   BMI 34.64 kg/m  Wt Readings from Last 3 Encounters:  03/15/20 201 lb 12.8 oz (91.5 kg)  02/26/20 196 lb 9.6 oz (89.2 kg)  02/01/20 195 lb (88.5 kg)     Health  Maintenance Due  Topic Date Due  . Hepatitis C Screening  Never done  . FOOT EXAM  Never done  . COVID-19 Vaccine (1) Never done  . HIV Screening  Never done  . TETANUS/TDAP  Never done  . OPHTHALMOLOGY EXAM  09/14/2016  . MAMMOGRAM  09/11/2017  . COLONOSCOPY (Pts 45-25yrs Insurance coverage will need to be confirmed)  11/10/2017  . PAP SMEAR-Modifier  06/23/2018    There are no preventive care reminders to display for this patient.  No results found for: TSH Lab Results  Component Value Date   WBC 11.8 (H) 01/18/2020   HGB 15.4 01/18/2020   HCT 46.4 01/18/2020   MCV 88 01/18/2020   PLT 299 01/18/2020   Lab Results  Component Value Date   NA 138 01/18/2020   K 4.2 01/18/2020   CO2 25 01/18/2020   GLUCOSE 142 (H) 01/18/2020   BUN 10 01/18/2020   CREATININE 0.62 01/18/2020   BILITOT 0.4 01/18/2020   ALKPHOS 110 01/18/2020   AST 16 01/18/2020   ALT 24 01/18/2020   PROT 7.0 01/18/2020   ALBUMIN 4.2 01/18/2020   CALCIUM 9.0 01/18/2020   ANIONGAP 9 05/19/2014   Lab Results  Component Value Date   CHOL 144 01/18/2020   Lab Results  Component Value Date   HDL 48 01/18/2020   Lab Results  Component Value Date   LDLCALC 71 01/18/2020   Lab Results  Component Value Date   TRIG 141 01/18/2020   Lab Results  Component Value Date   CHOLHDL 3.0 01/18/2020   Lab Results  Component Value Date   HGBA1C 7.3 (H) 01/18/2020      Assessment & Plan:   Problem List Items Addressed This Visit      Other   Wound of right leg - Primary    Right lower extremity wound is gradually improving.  No signs or symptoms of infection.  Continue to provide wound care education.  Advised patient to follow-up in 3 weeks or with worsening symptoms.  Printed handouts given           Follow-up: Return in about 3 weeks (around 04/05/2020).    04/07/2020, NP

## 2020-03-15 NOTE — Assessment & Plan Note (Signed)
Right lower extremity wound is gradually improving.  No signs or symptoms of infection.  Continue to provide wound care education.  Advised patient to follow-up in 3 weeks or with worsening symptoms.  Printed handouts given

## 2020-04-05 ENCOUNTER — Encounter: Payer: Self-pay | Admitting: Nurse Practitioner

## 2020-04-05 ENCOUNTER — Ambulatory Visit (INDEPENDENT_AMBULATORY_CARE_PROVIDER_SITE_OTHER): Payer: Self-pay | Admitting: Nurse Practitioner

## 2020-04-05 DIAGNOSIS — S81801D Unspecified open wound, right lower leg, subsequent encounter: Secondary | ICD-10-CM

## 2020-04-05 NOTE — Progress Notes (Signed)
   Virtual Visit via telephone Note Due to COVID-19 pandemic this visit was conducted virtually. This visit type was conducted due to national recommendations for restrictions regarding the COVID-19 Pandemic (e.g. social distancing, sheltering in place) in an effort to limit this patient's exposure and mitigate transmission in our community. All issues noted in this document were discussed and addressed.  A physical exam was not performed with this format.  I connected with Kimberly Donaldson on 04/05/20 at 08:20 am by telephone and verified that I am speaking with the correct person using two identifiers. Kimberly Donaldson is currently located at home during visit. The provider, Ivy Lynn, NP is located in their office at time of visit.  I discussed the limitations, risks, security and privacy concerns of performing an evaluation and management service by telephone and the availability of in person appointments. I also discussed with the patient that there may be a patient responsible charge related to this service. The patient expressed understanding and agreed to proceed.   History and Present Illness:  Wound Check She was originally treated more than 14 days ago. Previous treatment included oral antibiotics. Her temperature was unmeasured prior to arrival. There has been no drainage from the wound. There is no redness present. There is no swelling present. There is no pain present. She has no difficulty moving the affected extremity or digit.      Review of Systems  Constitutional: Negative for chills, fever and malaise/fatigue.  Respiratory: Negative.   Cardiovascular: Negative.   Gastrointestinal: Negative.   Skin: Negative for itching and rash.  Neurological: Negative.   All other systems reviewed and are negative.    Observations/Objective: Tele visit Assessment and Plan: Wound of right leg Patient is being followed up via televisit today for wound of her right leg.  This is  a subsequent follow-up.  Patient has a history of diabetes.  Wound has been present for the last few months.  Patient reports skin is dry and healed with no pain, skin around wound area is nonerythematous, no signs and symptoms of infection.  Patient no longer wraps or addresses wound because it is completely healed. Advised patient to follow-up with any unresolved or any symptoms of infection.  Patient verbalized understanding.  Follow Up Instructions:  Follow up as needed.   I discussed the assessment and treatment plan with the patient. The patient was provided an opportunity to ask questions and all were answered. The patient agreed with the plan and demonstrated an understanding of the instructions.   The patient was advised to call back or seek an in-person evaluation if the symptoms worsen or if the condition fails to improve as anticipated.  The above assessment and management plan was discussed with the patient. The patient verbalized understanding of and has agreed to the management plan. Patient is aware to call the clinic if symptoms persist or worsen. Patient is aware when to return to the clinic for a follow-up visit. Patient educated on when it is appropriate to go to the emergency department.   Time call ended:  08:28 Am  I provided 8  minutes of non-face-to-face time during this encounter.    Ivy Lynn, NP

## 2020-04-05 NOTE — Assessment & Plan Note (Signed)
Patient is being followed up via televisit today for wound of her right leg.  This is a subsequent follow-up.  Patient has a history of diabetes.  Wound has been present for the last few months.  Patient reports skin is dry and healed with no pain, skin around wound area is nonerythematous, no signs and symptoms of infection.  Patient no longer wraps or addresses wound because it is completely healed. Advised patient to follow-up with any unresolved or any symptoms of infection.  Patient verbalized understanding.

## 2020-04-05 NOTE — Patient Instructions (Signed)
Wound Care, Adult Taking care of your wound properly can help to prevent pain, infection, and scarring. It can also help your wound heal more quickly. Follow instructions from your health care provider about how to care for your wound. Supplies needed:  Soap and water.  Wound cleanser.  Gauze.  If needed, a clean bandage (dressing) or other type of wound dressing material to cover or place in the wound. Follow your health care provider's instructions about what dressing supplies to use.  Cream or ointment to apply to the wound, if told by your health care provider. How to care for your wound Cleaning the wound Ask your health care provider how to clean the wound. This may include:  Using mild soap and water or a wound cleanser.  Using a clean gauze to pat the wound dry after cleaning it. Do not rub or scrub the wound. Dressing care  Wash your hands with soap and water for at least 20 seconds before and after you change the dressing. If soap and water are not available, use hand sanitizer.  Change your dressing as told by your health care provider. This may include: ? Cleaning or rinsing out (irrigating) the wound. ? Placing a dressing over the wound or in the wound (packing). ? Covering the wound with an outer dressing.  Leave any stitches (sutures), skin glue, or adhesive strips in place. These skin closures may need to stay in place for 2 weeks or longer. If adhesive strip edges start to loosen and curl up, you may trim the loose edges. Do not remove adhesive strips completely unless your health care provider tells you to do that.  Ask your health care provider when you can leave the wound uncovered. Checking for infection Check your wound area every day for signs of infection. Check for:  More redness, swelling, or pain.  Fluid or blood.  Warmth.  Pus or a bad smell.   Follow these instructions at home Medicines  If you were prescribed an antibiotic medicine, cream, or  ointment, take or apply it as told by your health care provider. Do not stop using the antibiotic even if your condition improves.  If you were prescribed pain medicine, take it 30 minutes before you do any wound care or as told by your health care provider.  Take over-the-counter and prescription medicines only as told by your health care provider. Eating and drinking  Eat a diet that includes protein, vitamin A, vitamin C, and other nutrient-rich foods to help the wound heal. ? Foods rich in protein include meat, fish, eggs, dairy, beans, and nuts. ? Foods rich in vitamin A include carrots and dark green, leafy vegetables. ? Foods rich in vitamin C include citrus fruits, tomatoes, broccoli, and peppers.  Drink enough fluid to keep your urine pale yellow. General instructions  Do not take baths, swim, use a hot tub, or do anything that would put the wound underwater until your health care provider approves. Ask your health care provider if you may take showers. You may only be allowed to take sponge baths.  Do not scratch or pick at the wound. Keep it covered as told by your health care provider.  Return to your normal activities as told by your health care provider. Ask your health care provider what activities are safe for you.  Protect your wound from the sun when you are outside for the first 6 months, or for as long as told by your health care provider. Cover   up the scar area or apply sunscreen that has an SPF of at least 30.  Do not use any products that contain nicotine or tobacco, such as cigarettes, e-cigarettes, and chewing tobacco. These may delay wound healing. If you need help quitting, ask your health care provider.  Keep all follow-up visits as told by your health care provider. This is important. Contact a health care provider if:  You received a tetanus shot and you have swelling, severe pain, redness, or bleeding at the injection site.  Your pain is not controlled  with medicine.  You have any of these signs of infection: ? More redness, swelling, or pain around the wound. ? Fluid or blood coming from the wound. ? Warmth coming from the wound. ? Pus or a bad smell coming from the wound. ? A fever or chills.  You are nauseous or you vomit.  You are dizzy. Get help right away if:  You have a red streak of skin near the area around your wound.  Your wound has been closed with staples, sutures, skin glue, or adhesive strips and it begins to open up and separate.  Your wound is bleeding, and the bleeding does not stop with gentle pressure.  You have a rash.  You faint.  You have trouble breathing. These symptoms may represent a serious problem that is an emergency. Do not wait to see if the symptoms will go away. Get medical help right away. Call your local emergency services (911 in the [place] Do not drive yourself to the hospital. Summary  Always wash your hands with soap and water for at least 20 seconds before and after changing your dressing.  Change your dressing as told by your health care provider.  To help with healing, eat foods that are rich in protein, vitamin A, vitamin C, and other nutrients.  Check your wound every day for signs of infection. Contact your health care provider if you suspect that your wound is infected. This information is not intended to replace advice given to you by your health care provider. Make sure you discuss any questions you have with your health care provider. Document Revised: 12/19/2018 Document Reviewed: 12/19/2018 Elsevier Patient Education  2021 [employer]  

## 2020-04-19 ENCOUNTER — Ambulatory Visit: Payer: 59 | Admitting: Family Medicine

## 2020-06-24 ENCOUNTER — Other Ambulatory Visit: Payer: Self-pay

## 2020-06-24 ENCOUNTER — Encounter: Payer: Self-pay | Admitting: Family Medicine

## 2020-06-24 ENCOUNTER — Ambulatory Visit (INDEPENDENT_AMBULATORY_CARE_PROVIDER_SITE_OTHER): Payer: Medicare Other | Admitting: Family Medicine

## 2020-06-24 VITALS — BP 151/62 | HR 68 | Ht 64.0 in | Wt 202.0 lb

## 2020-06-24 DIAGNOSIS — Z0000 Encounter for general adult medical examination without abnormal findings: Secondary | ICD-10-CM

## 2020-06-24 DIAGNOSIS — I10 Essential (primary) hypertension: Secondary | ICD-10-CM

## 2020-06-24 DIAGNOSIS — Z1211 Encounter for screening for malignant neoplasm of colon: Secondary | ICD-10-CM

## 2020-06-24 DIAGNOSIS — E119 Type 2 diabetes mellitus without complications: Secondary | ICD-10-CM | POA: Diagnosis not present

## 2020-06-24 DIAGNOSIS — Z23 Encounter for immunization: Secondary | ICD-10-CM

## 2020-06-24 DIAGNOSIS — Z5320 Procedure and treatment not carried out because of patient's decision for unspecified reasons: Secondary | ICD-10-CM

## 2020-06-24 DIAGNOSIS — Z114 Encounter for screening for human immunodeficiency virus [HIV]: Secondary | ICD-10-CM

## 2020-06-24 DIAGNOSIS — Z1159 Encounter for screening for other viral diseases: Secondary | ICD-10-CM

## 2020-06-24 LAB — BAYER DCA HB A1C WAIVED: HB A1C (BAYER DCA - WAIVED): 6.4 % (ref <7.0)

## 2020-06-24 NOTE — Progress Notes (Signed)
BP (!) 151/62   Pulse 68   Ht _0  (1.626 m)   Wt 202 lb (91.6 kg)   SpO2 97%   BMI 34.67 kg/m    Subjective:   Patient ID: Kimberly Donaldson, female    DOB: 1955-12-02, 65 y.o.   MRN: [id number]  HPI: Kimberly Donaldson is a 65 y.o. female presenting on 06/24/2020 for Medical Management of Chronic Issues, Diabetes, and Hypertension   HPI  Adult well exam and physical Patient denies any chest pain, shortness of breath, headaches or vision issues, abdominal complaints, diarrhea, nausea, vomiting, or joint issues.  She does have a little bit of problems with her thumb on the right side, she has had a trigger finger before but now it just is stiff and does not want to bend is easily.  Seems like it is osteoarthritis.  Hypertension Patient is currently on lisinopril, and their blood pressure today is 151/62, will recheck and monitor to keep a closer eye on it at home.  She takes lisinopril every evening.. Patient denies any lightheadedness or dizziness. Patient denies headaches, blurred vision, chest pains, shortness of breath, or weakness. Denies any side effects from medication and is content with current medication.   Type 2 diabetes mellitus Patient comes in today for recheck of his diabetes. Patient has been currently taking no medication, was on Januvia but because of expense it was too expensive so she stopped it about 3 or 4 weeks ago.. Patient is currently on an ACE inhibitor/ARB. Patient has not seen an ophthalmologist this year. Patient denies any issues with their feet. The symptom started onset as an adult hypertension ARE RELATED TO DM   Relevant past medical, surgical, family and social history reviewed and updated as indicated. Interim medical history since our last visit reviewed. Allergies and medications reviewed and updated.  Review of Systems  Constitutional: Negative for chills and fever.  HENT: Negative for congestion, ear discharge and ear pain.   Eyes: Negative for  redness and visual disturbance.  Respiratory: Negative for chest tightness and shortness of breath.   Cardiovascular: Negative for chest pain and leg swelling.  Genitourinary: Negative for difficulty urinating and dysuria.  Musculoskeletal: Positive for arthralgias. Negative for back pain and gait problem.  Skin: Negative for rash.  Neurological: Negative for light-headedness and headaches.  Psychiatric/Behavioral: Negative for agitation and behavioral problems.  All other systems reviewed and are negative.   Per HPI unless specifically indicated above   Allergies as of 06/24/2020      Reactions   Codeine    REACTION: ringing in ears   Morphine    REACTION: hives   Other    sporinof - for nail fungus      Medication List       Accurate as of June 24, 2020 10:04 AM. If you have any questions, ask your nurse or doctor.        STOP taking these medications   collagenase ointment Commonly known as: SANTYL Stopped by: [name], MD   oxyCODONE-acetaminophen 5-325 MG tablet Commonly known as: Roxicet Stopped by: [name], MD     TAKE these medications   aspirin EC 81 MG tablet Take 81 mg by mouth daily. Swallow whole.   cyclobenzaprine 10 MG tablet Commonly known as: FLEXERIL TAKE 1 TABLET THREE TIMES DAILY AS NEEDED FOR MUSCLE SPASM   ibuprofen 200 MG tablet Commonly known as: ADVIL Take 800 mg by mouth every 6 (six) hours as  needed for moderate pain.   lisinopril 20 MG tablet Commonly known as: ZESTRIL Take 1 tablet (20 mg total) by mouth daily.   sitaGLIPtin 50 MG tablet Commonly known as: Januvia Take 1 tablet (50 mg total) by mouth daily.        Objective:   BP (!) 151/62   Pulse 68   Ht _0  (1.626 m)   Wt 202 lb (91.6 kg)   SpO2 97%   BMI 34.67 kg/m   Wt Readings from Last 3 Encounters:  06/24/20 202 lb (91.6 kg)  03/15/20 201 lb 12.8 oz (91.5 kg)  02/26/20 196 lb 9.6 oz (89.2 kg)    Physical Exam Vitals and nursing  note reviewed.  Constitutional:      General: She is not in acute distress.    Appearance: She is well-developed. She is not diaphoretic.  Eyes:     Conjunctiva/sclera: Conjunctivae normal.  Cardiovascular:     Rate and Rhythm: Normal rate and regular rhythm.     Heart sounds: Normal heart sounds. No murmur heard.   Pulmonary:     Effort: Pulmonary effort is normal. No respiratory distress.     Breath sounds: Normal breath sounds. No wheezing.  Abdominal:     General: Abdomen is flat. Bowel sounds are normal. There is no distension.     Tenderness: There is no abdominal tenderness. There is no guarding or rebound.  Musculoskeletal:        General: No tenderness. Normal range of motion.  Skin:    General: Skin is warm and dry.     Findings: No rash.  Neurological:     Mental Status: She is alert and oriented to person, place, and time.     Coordination: Coordination normal.  Psychiatric:        Behavior: Behavior normal.       Assessment & Plan:   Problem List Items Addressed This Visit      Cardiovascular and Mediastinum   HTN (hypertension) - Primary   Relevant Orders   CBC with Differential/Platelet   CMP14+EGFR   Lipid panel     Endocrine   DM2 (diabetes mellitus, type 2) ([place])   Relevant Orders   CBC with Differential/Platelet   CMP14+EGFR   Lipid panel   Bayer DCA Hb A1c Waived    Other Visit Diagnoses    Need for Tdap vaccination       Relevant Orders   Tdap vaccine greater than or equal to 7yo IM (Completed)   Colon cancer screening       Relevant Orders   Ambulatory referral to Gastroenterology   Need for hepatitis C screening test       Relevant Orders   Hepatitis C antibody   HIV screening declined       Encounter for screening for HIV       Relevant Orders   HIV Antibody (routine testing w rflx)   Physical exam          No change in current medication, will see what blood work is and may consider restarting Januvia with prescription  assistance or something else.  Has not tolerated Metformin in the past.  For patient's right thumb, likely arthritis, recommended over-the-counter Voltaren gel.  For blood pressure, slightly elevated will monitor closely and start checking at home and let me know how it runs over the next few weeks. Follow up plan: Return in about 6 months (around 12/24/2020), or if symptoms worsen or fail to  improve, for Diabetes and hypertension.  Counseling provided for all of the vaccine components Orders Placed This Encounter  Procedures  . Tdap vaccine greater than or equal to 7yo IM    Caryl Pina, MD [facility] 06/24/2020, 10:04 AM

## 2020-06-25 LAB — CMP14+EGFR
ALT: 13 IU/L (ref 0–32)
AST: 14 IU/L (ref 0–40)
Albumin/Globulin Ratio: 1.7 (ref 1.2–2.2)
Albumin: 4.1 g/dL (ref 3.8–4.8)
Alkaline Phosphatase: 88 IU/L (ref 44–121)
BUN/Creatinine Ratio: 17 (ref 12–28)
BUN: 14 mg/dL (ref 8–27)
Bilirubin Total: 0.3 mg/dL (ref 0.0–1.2)
CO2: 22 mmol/L (ref 20–29)
Calcium: 8.8 mg/dL (ref 8.7–10.3)
Chloride: 101 mmol/L (ref 96–106)
Creatinine, Ser: 0.84 mg/dL (ref 0.57–1.00)
Globulin, Total: 2.4 g/dL (ref 1.5–4.5)
Glucose: 200 mg/dL — ABNORMAL HIGH (ref 65–99)
Potassium: 4.4 mmol/L (ref 3.5–5.2)
Sodium: 139 mmol/L (ref 134–144)
Total Protein: 6.5 g/dL (ref 6.0–8.5)
eGFR: 78 mL/min/{1.73_m2} (ref >59)

## 2020-06-25 LAB — LIPID PANEL
Chol/HDL Ratio: 3.1 ratio (ref 0.0–4.4)
Cholesterol, Total: 134 mg/dL (ref 100–199)
HDL: 43 mg/dL (ref >39)
LDL Chol Calc (NIH): 49 mg/dL (ref 0–99)
Triglycerides: 271 mg/dL — ABNORMAL HIGH (ref 0–149)
VLDL Cholesterol Cal: 42 mg/dL — ABNORMAL HIGH (ref 5–40)

## 2020-06-25 LAB — CBC WITH DIFFERENTIAL/PLATELET
Basophils Absolute: 0.1 10*3/uL (ref 0.0–0.2)
Basos: 2 %
EOS (ABSOLUTE): 0.2 10*3/uL (ref 0.0–0.4)
Eos: 2 %
Hematocrit: 45.2 % (ref 34.0–46.6)
Hemoglobin: 15.5 g/dL (ref 11.1–15.9)
Immature Grans (Abs): 0 10*3/uL (ref 0.0–0.1)
Immature Granulocytes: 0 %
Lymphocytes Absolute: 3 10*3/uL (ref 0.7–3.1)
Lymphs: 35 %
MCH: 29.7 pg (ref 26.6–33.0)
MCHC: 34.3 g/dL (ref 31.5–35.7)
MCV: 87 fL (ref 79–97)
Monocytes Absolute: 0.7 10*3/uL (ref 0.1–0.9)
Monocytes: 8 %
Neutrophils Absolute: 4.6 10*3/uL (ref 1.4–7.0)
Neutrophils: 53 %
Platelets: 252 10*3/uL (ref 150–450)
RBC: 5.22 x10E6/uL (ref 3.77–5.28)
RDW: 13 % (ref 11.7–15.4)
WBC: 8.7 10*3/uL (ref 3.4–10.8)

## 2020-06-25 LAB — HEPATITIS C ANTIBODY: Hep C Virus Ab: <0.1 s/co ratio (ref 0.0–0.9)

## 2020-06-25 LAB — HIV ANTIBODY (ROUTINE TESTING W REFLEX): HIV Screen 4th Generation wRfx: NONREACTIVE

## 2020-06-29 ENCOUNTER — Telehealth: Payer: Self-pay | Admitting: Family Medicine

## 2020-06-29 NOTE — Telephone Encounter (Signed)
Called and spoke with patient and advised that she should go to the ER. She states that she doesn't have a car and she does not want to ride the ambulance because she is keeping her 65 year old grandson. Wants to know what else you suggest for her. Please advise

## 2020-06-29 NOTE — Telephone Encounter (Signed)
If it continues to go up and she continues to have symptoms then please have her go to the ER, if her blood pressure comes down and does not go to the ER then please schedule appointment ASAP for her to be seen here in the office.  I do not know what we have left tomorrow but see if we can get her in tomorrow if possible

## 2020-06-29 NOTE — Telephone Encounter (Signed)
Pt BP is high and Dr Dettinger told her to keep a check and call in if it was high  4/11 - 185/75,175/83 4/12 - 197/89, 190/87 (had headache, not sure if it was sinus) 4/13 - 210/93, 207/84 (more tired than usual)

## 2020-06-29 NOTE — Telephone Encounter (Signed)
Dr. Merita Norton instructions given to Kimberly Donaldson. She understands. Advised Kimberly Donaldson to rest or lay down for 20-30 minutes in a dark room to see if she could get it to come down. If not then she needs to go to ER or call for an appt asap.

## 2020-06-30 ENCOUNTER — Other Ambulatory Visit: Payer: Self-pay | Admitting: Family Medicine

## 2020-06-30 ENCOUNTER — Encounter: Payer: Self-pay | Admitting: Family Medicine

## 2020-06-30 ENCOUNTER — Ambulatory Visit (INDEPENDENT_AMBULATORY_CARE_PROVIDER_SITE_OTHER): Payer: Medicare Other | Admitting: Family Medicine

## 2020-06-30 ENCOUNTER — Encounter: Payer: Self-pay | Admitting: Gastroenterology

## 2020-06-30 ENCOUNTER — Other Ambulatory Visit: Payer: Self-pay

## 2020-06-30 VITALS — BP 170/65 | HR 67 | Temp 97.7°F | Ht 64.0 in | Wt 200.2 lb

## 2020-06-30 DIAGNOSIS — I10 Essential (primary) hypertension: Secondary | ICD-10-CM

## 2020-06-30 DIAGNOSIS — Z720 Tobacco use: Secondary | ICD-10-CM | POA: Diagnosis not present

## 2020-06-30 DIAGNOSIS — E669 Obesity, unspecified: Secondary | ICD-10-CM

## 2020-06-30 MED ORDER — AMLODIPINE BESYLATE 5 MG PO TABS
5.0000 mg | ORAL_TABLET | Freq: Every day | ORAL | 2 refills | Status: DC
Start: 2020-06-30 — End: 2020-08-12

## 2020-06-30 MED ORDER — LISINOPRIL 20 MG PO TABS: 40.0000 mg | ORAL_TABLET | Freq: Every day | ORAL | 2 refills | Status: DC

## 2020-06-30 NOTE — Patient Instructions (Signed)
DASH Eating Plan DASH stands for Dietary Approaches to Stop Hypertension. The DASH eating plan is a healthy eating plan that has been shown to:  Reduce high blood pressure (hypertension).  Reduce your risk for type 2 diabetes, heart disease, and stroke.  Help with weight loss. What are tips for following this plan? Reading food labels  Check food labels for the amount of salt (sodium) per serving. Choose foods with less than 5 percent of the Daily Value of sodium. Generally, foods with less than 300 milligrams (mg) of sodium per serving fit into this eating plan.  To find whole grains, look for the word "whole" as the first word in the ingredient list. Shopping  Buy products labeled as "low-sodium" or "no salt added."  Buy fresh foods. Avoid canned foods and pre-made or frozen meals. Cooking  Avoid adding salt when cooking. Use salt-free seasonings or herbs instead of table salt or sea salt. Check with your health care provider or pharmacist before using salt substitutes.  Do not fry foods. Cook foods using healthy methods such as baking, boiling, grilling, roasting, and broiling instead.  Cook with heart-healthy oils, such as olive, canola, avocado, soybean, or sunflower oil. Meal planning  Eat a balanced diet that includes: ? 4 or more servings of fruits and 4 or more servings of vegetables each day. Try to fill one-half of your plate with fruits and vegetables. ? 6-8 servings of whole grains each day. ? Less than 6 oz (170 g) of lean meat, poultry, or fish each day. A 3-oz (85-g) serving of meat is about the same size as a deck of cards. One egg equals 1 oz (28 g). ? 2-3 servings of low-fat dairy each day. One serving is 1 cup (237 mL). ? 1 serving of nuts, seeds, or beans 5 times each week. ? 2-3 servings of heart-healthy fats. Healthy fats called omega-3 fatty acids are found in foods such as walnuts, flaxseeds, fortified milks, and eggs. These fats are also found in  cold-water fish, such as sardines, salmon, and mackerel.  Limit how much you eat of: ? Canned or prepackaged foods. ? Food that is high in trans fat, such as some fried foods. ? Food that is high in saturated fat, such as fatty meat. ? Desserts and other sweets, sugary drinks, and other foods with added sugar. ? Full-fat dairy products.  Do not salt foods before eating.  Do not eat more than 4 egg yolks a week.  Try to eat at least 2 vegetarian meals a week.  Eat more home-cooked food and less restaurant, buffet, and fast food.   Lifestyle  When eating at a restaurant, ask that your food be prepared with less salt or no salt, if possible.  If you drink alcohol: ? Limit how much you use to:  0-1 drink a day for women who are not pregnant.  0-2 drinks a day for men. ? Be aware of how much alcohol is in your drink. In the [place] one drink equals one 12 oz bottle of beer (355 mL), one 5 oz glass of wine (148 mL), or one 1 oz glass of hard liquor (44 mL). General information  Avoid eating more than 2,300 mg of salt a day. If you have hypertension, you may need to reduce your sodium intake to 1,500 mg a day.  Work with your health care provider to maintain a healthy body weight or to lose weight. Ask what an ideal weight is for you.    Get at least 30 minutes of exercise that causes your heart to beat faster (aerobic exercise) most days of the week. Activities may include walking, swimming, or biking.  Work with your health care provider or dietitian to adjust your eating plan to your individual calorie needs. What foods should I eat? Fruits All fresh, dried, or frozen fruit. Canned fruit in natural juice (without added sugar). Vegetables Fresh or frozen vegetables (raw, steamed, roasted, or grilled). Low-sodium or reduced-sodium tomato and vegetable juice. Low-sodium or reduced-sodium tomato sauce and tomato paste. Low-sodium or reduced-sodium canned vegetables. Grains Whole-grain  or whole-wheat bread. Whole-grain or whole-wheat pasta. Brown rice. Oatmeal. Quinoa. Bulgur. Whole-grain and low-sodium cereals. Pita bread. Low-fat, low-sodium crackers. Whole-wheat flour tortillas. Meats and other proteins Skinless chicken or turkey. Ground chicken or turkey. Pork with fat trimmed off. Fish and seafood. Egg whites. Dried beans, peas, or lentils. Unsalted nuts, nut butters, and seeds. Unsalted canned beans. Lean cuts of beef with fat trimmed off. Low-sodium, lean precooked or cured meat, such as sausages or meat loaves. Dairy Low-fat (1%) or fat-free (skim) milk. Reduced-fat, low-fat, or fat-free cheeses. Nonfat, low-sodium ricotta or cottage cheese. Low-fat or nonfat yogurt. Low-fat, low-sodium cheese. Fats and oils Soft margarine without trans fats. Vegetable oil. Reduced-fat, low-fat, or light mayonnaise and salad dressings (reduced-sodium). Canola, safflower, olive, avocado, soybean, and sunflower oils. Avocado. Seasonings and condiments Herbs. Spices. Seasoning mixes without salt. Other foods Unsalted popcorn and pretzels. Fat-free sweets. The items listed above may not be a complete list of foods and beverages you can eat. Contact a dietitian for more information. What foods should I avoid? Fruits Canned fruit in a light or heavy syrup. Fried fruit. Fruit in cream or butter sauce. Vegetables Creamed or fried vegetables. Vegetables in a cheese sauce. Regular canned vegetables (not low-sodium or reduced-sodium). Regular canned tomato sauce and paste (not low-sodium or reduced-sodium). Regular tomato and vegetable juice (not low-sodium or reduced-sodium). Pickles. Olives. Grains Baked goods made with fat, such as croissants, muffins, or some breads. Dry pasta or rice meal packs. Meats and other proteins Fatty cuts of meat. Ribs. Fried meat. Bacon. Bologna, salami, and other precooked or cured meats, such as sausages or meat loaves. Fat from the back of a pig (fatback).  Bratwurst. Salted nuts and seeds. Canned beans with added salt. Canned or smoked fish. Whole eggs or egg yolks. Chicken or turkey with skin. Dairy Whole or 2% milk, cream, and half-and-half. Whole or full-fat cream cheese. Whole-fat or sweetened yogurt. Full-fat cheese. Nondairy creamers. Whipped toppings. Processed cheese and cheese spreads. Fats and oils Butter. Stick margarine. Lard. Shortening. Ghee. Bacon fat. Tropical oils, such as coconut, palm kernel, or palm oil. Seasonings and condiments Onion salt, garlic salt, seasoned salt, table salt, and sea salt. Worcestershire sauce. Tartar sauce. Barbecue sauce. Teriyaki sauce. Soy sauce, including reduced-sodium. Steak sauce. Canned and packaged gravies. Fish sauce. Oyster sauce. Cocktail sauce. Store-bought horseradish. Ketchup. Mustard. Meat flavorings and tenderizers. Bouillon cubes. Hot sauces. Pre-made or packaged marinades. Pre-made or packaged taco seasonings. Relishes. Regular salad dressings. Other foods Salted popcorn and pretzels. The items listed above may not be a complete list of foods and beverages you should avoid. Contact a dietitian for more information. Where to find more information  National Heart, Lung, and Blood Institute: www.nhlbi.nih.gov  American Heart Association: www.heart.org  Academy of Nutrition and Dietetics: www.eatright.org  National Kidney Foundation: www.kidney.org Summary  The DASH eating plan is a healthy eating plan that has been shown to reduce high   blood pressure (hypertension). It may also reduce your risk for type 2 diabetes, heart disease, and stroke.  When on the DASH eating plan, aim to eat more fresh fruits and vegetables, whole grains, lean proteins, low-fat dairy, and heart-healthy fats.  With the DASH eating plan, you should limit salt (sodium) intake to 2,300 mg a day. If you have hypertension, you may need to reduce your sodium intake to 1,500 mg a day.  Work with your health care  provider or dietitian to adjust your eating plan to your individual calorie needs. This information is not intended to replace advice given to you by your health care provider. Make sure you discuss any questions you have with your health care provider. Document Revised: 02/06/2019 Document Reviewed: 02/06/2019 Elsevier Patient Education  2021 Elsevier Inc.   

## 2020-06-30 NOTE — Progress Notes (Signed)
Assessment & Plan:  1. Essential hypertension Uncontrolled. Lisinopril prescription changed to 40 mg once daily as she already increased this dosage on her own. Added amlodipine. Encouraged smoking cessation. Education provided on the DASH diet. Patient to continue monitoring her BP at home and keeping a log. Advised to call if systolic is staying > 413.  - lisinopril (ZESTRIL) 20 MG tablet; Take 2 tablets (40 mg total) by mouth daily.  Dispense: 60 tablet; Refill: 2 - amLODipine (NORVASC) 5 MG tablet; Take 1 tablet (5 mg total) by mouth daily.  Dispense: 30 tablet; Refill: 2  2. Tobacco use Discussed using gum, patches, candies, etc to help quit smoking. Wellbutrin is an option that she is going to think about.   3. Obesity (BMI 30.0-34.9) Discussed option of GLP-1 for treatment of diabetes to help with weight loss. She is going to think about this and discuss with her PCP.    Follow up plan: Return in about 1 week (around 07/07/2020) for HTN.  Hendricks Limes, MSN, APRN, FNP-C [facility]  Subjective:   Patient ID: Kimberly Donaldson, female    DOB: 08-10-1955, 65 y.o.   MRN: [id number]  HPI: Kimberly Donaldson is a 65 y.o. female presenting on 06/30/2020 for Hypertension (Patient states her BP has been running higher than normal. )  Patient had a visit with her PCP last week and was asked to monitor her BP at home which she has been doing. Her systolic ranges 536-644. Diastolic ranges 03-474 with 3/12 > 90.  Heart rate ranges 58-70 with 1/12 < 60. She denies chest pain, palpitations, shortness of breath, vision changes, swelling, dizziness, or headaches. She does smoke and is trying to quit. She increased her Lisinopril from 20 mg daily to twice daily on her own due to her high readings. She has been taking this dosage x3 days.   ROS: Negative unless specifically indicated above in HPI.   Relevant past medical history reviewed and updated as indicated.   Allergies and  medications reviewed and updated.   Current Outpatient Medications:  .  aspirin EC 81 MG tablet, Take 81 mg by mouth daily. Swallow whole., Disp: , Rfl:  .  cyclobenzaprine (FLEXERIL) 10 MG tablet, TAKE 1 TABLET THREE TIMES DAILY AS NEEDED FOR MUSCLE SPASM, Disp: 60 tablet, Rfl: 0 .  ibuprofen (ADVIL,MOTRIN) 200 MG tablet, Take 800 mg by mouth every 6 (six) hours as needed for moderate pain., Disp: , Rfl:  .  lisinopril (ZESTRIL) 20 MG tablet, Take 1 tablet (20 mg total) by mouth daily., Disp: 90 tablet, Rfl: 3  Allergies  Allergen Reactions  . Codeine     REACTION: ringing in ears  . Morphine     REACTION: hives  . Other     sporinof - for nail fungus    Objective:   BP (!) 170/65   Pulse 67   Temp 97.7 F (36.5 C) (Temporal)   Ht 5\' 4"  (1.626 m)   Wt 200 lb 3.2 oz (90.8 kg)   SpO2 95%   BMI 34.36 kg/m    Physical Exam Vitals reviewed.  Constitutional:      General: She is not in acute distress.    Appearance: Normal appearance. She is obese. She is not ill-appearing, toxic-appearing or diaphoretic.  HENT:     Head: Normocephalic and atraumatic.  Eyes:     General: No scleral icterus.       Right eye: No discharge.  Left eye: No discharge.     Conjunctiva/sclera: Conjunctivae normal.  Cardiovascular:     Rate and Rhythm: Normal rate and regular rhythm.     Heart sounds: Normal heart sounds. No murmur heard. No friction rub. No gallop.   Pulmonary:     Effort: Pulmonary effort is normal. No respiratory distress.     Breath sounds: Normal breath sounds. No stridor. No wheezing, rhonchi or rales.  Musculoskeletal:        General: Normal range of motion.     Cervical back: Normal range of motion.     Right lower leg: No edema.     Left lower leg: No edema.  Skin:    General: Skin is warm and dry.     Capillary Refill: Capillary refill takes less than 2 seconds.  Neurological:     General: No focal deficit present.     Mental Status: She is alert and  oriented to person, place, and time. Mental status is at baseline.  Psychiatric:        Mood and Affect: Mood normal.        Behavior: Behavior normal.        Thought Content: Thought content normal.        Judgment: Judgment normal.

## 2020-07-11 ENCOUNTER — Other Ambulatory Visit: Payer: Self-pay

## 2020-07-11 ENCOUNTER — Encounter: Payer: Self-pay | Admitting: Family Medicine

## 2020-07-11 ENCOUNTER — Ambulatory Visit (INDEPENDENT_AMBULATORY_CARE_PROVIDER_SITE_OTHER): Payer: Medicare Other | Admitting: Family Medicine

## 2020-07-11 VITALS — BP 170/72 | HR 63 | Ht 64.0 in | Wt 194.0 lb

## 2020-07-11 DIAGNOSIS — I10 Essential (primary) hypertension: Secondary | ICD-10-CM

## 2020-07-11 DIAGNOSIS — E669 Obesity, unspecified: Secondary | ICD-10-CM

## 2020-07-11 DIAGNOSIS — E781 Pure hyperglyceridemia: Secondary | ICD-10-CM

## 2020-07-11 DIAGNOSIS — E1169 Type 2 diabetes mellitus with other specified complication: Secondary | ICD-10-CM | POA: Diagnosis not present

## 2020-07-11 MED ORDER — OZEMPIC (0.25 OR 0.5 MG/DOSE) 2 MG/1.5ML ~~LOC~~ SOPN: 0.5000 mg | PEN_INJECTOR | SUBCUTANEOUS | 3 refills | Status: DC

## 2020-07-11 NOTE — Progress Notes (Signed)
BP (!) 170/72   Pulse 63   Ht _0  (1.626 m)   Wt 194 lb (88 kg)   SpO2 97%   BMI 33.30 kg/m    Subjective:   Patient ID: Kimberly Donaldson, female    DOB: [birth year], 65 y.o.   MRN: [id number]  HPI: Kimberly Donaldson is a 65 y.o. female presenting on 07/11/2020 for Medical Management of Chronic Issues and Hypertension (1 week follow up)   HPI Type 2 diabetes mellitus Patient comes in today for recheck of his diabetes. Patient has been currently taking no medication, has been diet controlled, and A1c was 6.4-week ago. Patient is currently on an ACE inhibitor/ARB. Patient has not seen an ophthalmologist this year. Patient denies any issues with their feet. The symptom started onset as an adult hypertension and hypertriglyceridemia ARE RELATED TO DM   Hypertension Patient is currently on amlodipine and lisinopril, just started amlodipine, and their blood pressure today is 741 systolic, home meds 287 but her home meter registering higher than here when tested against ours today. Patient denies any lightheadedness or dizziness. Patient denies headaches, blurred vision, chest pains, shortness of breath, or weakness. Denies any side effects from medication and is content with current medication.   Hypertriglyceridemia Patient is coming in for recheck of his hyperlipidemia. The patient is currently taking none currently, will try diet control see if she can get down.. They deny any issues with myalgias or history of liver damage from it. They deny any focal numbness or weakness or chest pain.   Relevant past medical, surgical, family and social history reviewed and updated as indicated. Interim medical history since our last visit reviewed. Allergies and medications reviewed and updated.  Review of Systems  Constitutional: Negative for chills and fever.  Eyes: Negative for visual disturbance.  Respiratory: Negative for chest tightness and shortness of breath.   Cardiovascular: Negative for  chest pain and leg swelling.  Musculoskeletal: Negative for back pain and gait problem.  Skin: Negative for rash.  Neurological: Negative for light-headedness and headaches.  Psychiatric/Behavioral: Negative for agitation and behavioral problems.  All other systems reviewed and are negative.   Per HPI unless specifically indicated above   Allergies as of 07/11/2020      Reactions   Codeine    REACTION: ringing in ears   Morphine    REACTION: hives   Other    sporinof - for nail fungus      Medication List       Accurate as of July 11, 2020  9:17 AM. If you have any questions, ask your nurse or doctor.        amLODipine 5 MG tablet Commonly known as: NORVASC Take 1 tablet (5 mg total) by mouth daily.   aspirin EC 81 MG tablet Take 81 mg by mouth daily. Swallow whole.   cyclobenzaprine 10 MG tablet Commonly known as: FLEXERIL TAKE 1 TABLET THREE TIMES DAILY AS NEEDED FOR MUSCLE SPASM   ibuprofen 200 MG tablet Commonly known as: ADVIL Take 800 mg by mouth every 6 (six) hours as needed for moderate pain.   lisinopril 20 MG tablet Commonly known as: ZESTRIL Take 2 tablets (40 mg total) by mouth daily.   Ozempic (0.25 or 0.5 MG/DOSE) 2 MG/1.5ML Sopn Generic drug: Semaglutide(0.25 or 0.5MG/DOS) Inject 0.5 mg into the skin once a week. Started by: Fransisca Kaufmann Faraz Ponciano, MD        Objective:   BP (!) 170/72   Pulse  63   Ht _0  (1.626 m)   Wt 194 lb (88 kg)   SpO2 97%   BMI 33.30 kg/m   Wt Readings from Last 3 Encounters:  07/11/20 194 lb (88 kg)  06/30/20 200 lb 3.2 oz (90.8 kg)  06/24/20 202 lb (91.6 kg)    Physical Exam Vitals and nursing note reviewed.  Constitutional:      General: She is not in acute distress.    Appearance: She is well-developed. She is not diaphoretic.  Eyes:     Conjunctiva/sclera: Conjunctivae normal.  Cardiovascular:     Rate and Rhythm: Normal rate and regular rhythm.     Heart sounds: Normal heart sounds. No murmur  heard.   Pulmonary:     Effort: Pulmonary effort is normal. No respiratory distress.     Breath sounds: Normal breath sounds. No wheezing.  Musculoskeletal:        General: No tenderness. Normal range of motion.  Skin:    General: Skin is warm and dry.     Findings: No rash.  Neurological:     Mental Status: She is alert and oriented to person, place, and time.     Coordination: Coordination normal.  Psychiatric:        Behavior: Behavior normal.     Results for orders placed or performed in visit on 06/24/20  Hepatitis C antibody  Result Value Ref Range   Hep C Virus Ab <0.1 0.0 - 0.9 s/co ratio  HIV Antibody (routine testing w rflx)  Result Value Ref Range   HIV Screen 4th Generation wRfx Non Reactive Non Reactive  CBC with Differential/Platelet  Result Value Ref Range   WBC 8.7 3.4 - 10.8 x10E3/uL   RBC 5.22 3.77 - 5.28 x10E6/uL   Hemoglobin 15.5 11.1 - 15.9 g/dL   Hematocrit 45.2 34.0 - 46.6 %   MCV 87 79 - 97 fL   MCH 29.7 26.6 - 33.0 pg   MCHC 34.3 31.5 - 35.7 g/dL   RDW 13.0 11.7 - 15.4 %   Platelets 252 150 - 450 x10E3/uL   Neutrophils 53 Not Estab. %   Lymphs 35 Not Estab. %   Monocytes 8 Not Estab. %   Eos 2 Not Estab. %   Basos 2 Not Estab. %   Neutrophils Absolute 4.6 1.4 - 7.0 x10E3/uL   Lymphocytes Absolute 3.0 0.7 - 3.1 x10E3/uL   Monocytes Absolute 0.7 0.1 - 0.9 x10E3/uL   EOS (ABSOLUTE) 0.2 0.0 - 0.4 x10E3/uL   Basophils Absolute 0.1 0.0 - 0.2 x10E3/uL   Immature Granulocytes 0 Not Estab. %   Immature Grans (Abs) 0.0 0.0 - 0.1 x10E3/uL  CMP14+EGFR  Result Value Ref Range   Glucose 200 (H) 65 - 99 mg/dL   BUN 14 8 - 27 mg/dL   Creatinine, Ser 0.84 0.57 - 1.00 mg/dL   eGFR 78 >59 mL/min/1.73   BUN/Creatinine Ratio 17 12 - 28   Sodium 139 134 - 144 mmol/L   Potassium 4.4 3.5 - 5.2 mmol/L   Chloride 101 96 - 106 mmol/L   CO2 22 20 - 29 mmol/L   Calcium 8.8 8.7 - 10.3 mg/dL   Total Protein 6.5 6.0 - 8.5 g/dL   Albumin 4.1 3.8 - 4.8 g/dL    Globulin, Total 2.4 1.5 - 4.5 g/dL   Albumin/Globulin Ratio 1.7 1.2 - 2.2   Bilirubin Total 0.3 0.0 - 1.2 mg/dL   Alkaline Phosphatase 88 44 - 121 IU/L   AST  14 0 - 40 IU/L   ALT 13 0 - 32 IU/L  Lipid panel  Result Value Ref Range   Cholesterol, Total 134 100 - 199 mg/dL   Triglycerides 271 (H) 0 - 149 mg/dL   HDL 43 >39 mg/dL   VLDL Cholesterol Cal 42 (H) 5 - 40 mg/dL   LDL Chol Calc (NIH) 49 0 - 99 mg/dL   Chol/HDL Ratio 3.1 0.0 - 4.4 ratio  Bayer DCA Hb A1c Waived  Result Value Ref Range   HB A1C (BAYER DCA - WAIVED) 6.4 <7.0 %    Assessment & Plan:   Problem List Items Addressed This Visit      Cardiovascular and Mediastinum   HTN (hypertension) - Primary     Endocrine   DM2 (diabetes mellitus, type 2) (HCC)   Relevant Medications   Semaglutide,0.25 or 0.5MG/DOS, (OZEMPIC, 0.25 OR 0.5 MG/DOSE,) 2 MG/1.5ML SOPN   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  Gave sample of Trulicity today because we did not have a sample of Ozempic Follow up plan: Return in about 3 months (around 10/10/2020), or if symptoms worsen or fail to improve, for Hypertension and diabetes.  Counseling provided for all of the vaccine components No orders of the defined types were placed in this encounter.   Caryl Pina, MD [facility] 07/11/2020, 9:17 AM

## 2020-08-05 ENCOUNTER — Other Ambulatory Visit (INDEPENDENT_AMBULATORY_CARE_PROVIDER_SITE_OTHER): Payer: Medicare Other

## 2020-08-05 ENCOUNTER — Other Ambulatory Visit: Payer: Self-pay

## 2020-08-05 ENCOUNTER — Ambulatory Visit (INDEPENDENT_AMBULATORY_CARE_PROVIDER_SITE_OTHER): Payer: Medicare Other | Admitting: Gastroenterology

## 2020-08-05 ENCOUNTER — Encounter: Payer: Self-pay | Admitting: Gastroenterology

## 2020-08-05 VITALS — BP 184/82 | HR 66 | Ht 62.5 in | Wt 195.4 lb

## 2020-08-05 DIAGNOSIS — R197 Diarrhea, unspecified: Secondary | ICD-10-CM | POA: Diagnosis not present

## 2020-08-05 LAB — COMPREHENSIVE METABOLIC PANEL
ALT: 12 U/L (ref 0–35)
AST: 13 U/L (ref 0–37)
Albumin: 4.3 g/dL (ref 3.5–5.2)
Alkaline Phosphatase: 87 U/L (ref 39–117)
BUN: 16 mg/dL (ref 6–23)
CO2: 28 mEq/L (ref 19–32)
Calcium: 9.7 mg/dL (ref 8.4–10.5)
Chloride: 104 mEq/L (ref 96–112)
Creatinine, Ser: 0.78 mg/dL (ref 0.40–1.20)
GFR: 79.88 mL/min (ref >60.00)
Glucose, Bld: 109 mg/dL — ABNORMAL HIGH (ref 70–99)
Potassium: 4.4 mEq/L (ref 3.5–5.1)
Sodium: 139 mEq/L (ref 135–145)
Total Bilirubin: 0.6 mg/dL (ref 0.2–1.2)
Total Protein: 7.7 g/dL (ref 6.0–8.3)

## 2020-08-05 LAB — CBC
HCT: 47.9 % — ABNORMAL HIGH (ref 36.0–46.0)
Hemoglobin: 16.4 g/dL — ABNORMAL HIGH (ref 12.0–15.0)
MCHC: 34.3 g/dL (ref 30.0–36.0)
MCV: 85.9 fl (ref 78.0–100.0)
Platelets: 250 10*3/uL (ref 150.0–400.0)
RBC: 5.57 Mil/uL — ABNORMAL HIGH (ref 3.87–5.11)
RDW: 13.9 % (ref 11.5–15.5)
WBC: 9.5 10*3/uL (ref 4.0–10.5)

## 2020-08-05 LAB — C-REACTIVE PROTEIN: CRP: <1 mg/dL (ref 0.5–20.0)

## 2020-08-05 LAB — TSH: TSH: 2.48 u[IU]/mL (ref 0.35–4.50)

## 2020-08-05 LAB — SEDIMENTATION RATE: Sed Rate: 12 mm/hr (ref 0–30)

## 2020-08-05 NOTE — Progress Notes (Signed)
HPI: This is a very pleasant 65 year old woman who was referred to me by Dettinger, Fransisca Kaufmann, MD  to evaluate chronic loose stools.     I did a colonoscopy for her August 2009 for routine risk colon cancer screening.  She did have a slightly irregular appearing IC valve, I suspect this probably a somewhat everted, invaginated valve.  Biopsies were taken to be safe to rule out adenomatous change.  No other obvious concerning sites.  No polyps.  The biopsies which I took for IC valve showed normal terminal ileum.  I recommended repeat colonoscopy for colon cancer screening at 10-year interval.  Blood work April 2022 show normal CBC, negative hepatitis C antibody, negative HIV testing, normal complete metabolic profile except for elevated blood sugar,  I last saw her about 12 or 13 years ago at the time of a colonoscopy.  Today she is here for a different reason.  She has chronic loose for 10 to 15 years, sometimes urgent stools.  She will generally go once or twice a day often with urgency and sometimes with incontinence.  When she takes a long car trip she will take Imodium with her.  She will stick a change of close.  She never sees blood in her stool.  She really does not have significant abdominal pains.  She drinks 4 or 5 coffees daily, she will also drink a Coke or Diet Coke sometimes in the afternoon.  She does not drink sports drinks or sugar-free products.  Colon cancer does not run in her family.  She has been intentionally losing weight over the past 6 to 8 months, 20 pounds with dietary modifications to try to get her diabetes under control.  Her brother takes dicyclomine and likes those results that he has not found.  Review of systems: Pertinent positive and negative review of systems were noted in the above HPI section. All other review negative.   Past Medical History:  Diagnosis Date  . Diabetes mellitus without complication [facility])     Past Surgical History:  Procedure  Laterality Date  . CESAREAN SECTION    . COLONOSCOPY    . FOOT SURGERY    . WRIST SURGERY      Current Outpatient Medications  Medication Sig Dispense Refill  . amLODipine (NORVASC) 5 MG tablet Take 1 tablet (5 mg total) by mouth daily. 30 tablet 2  . aspirin EC 81 MG tablet Take 81 mg by mouth daily. Swallow whole.    . cyclobenzaprine (FLEXERIL) 10 MG tablet TAKE 1 TABLET THREE TIMES DAILY AS NEEDED FOR MUSCLE SPASM 60 tablet 0  . ibuprofen (ADVIL,MOTRIN) 200 MG tablet Take 800 mg by mouth every 6 (six) hours as needed for moderate pain.    [name] lisinopril (ZESTRIL) 20 MG tablet Take 2 tablets (40 mg total) by mouth daily. 60 tablet 2  . loperamide (IMODIUM) 2 MG capsule Take by mouth as needed for diarrhea or loose stools.    . Multiple Vitamins-Minerals (AIRBORNE PO) Take 1 tablet by mouth daily.    . Omega-3 Fatty Acids (FISH OIL PO) Take 1 tablet by mouth daily.    . Semaglutide,0.25 or 0.5MG /DOS, (OZEMPIC, 0.25 OR 0.5 MG/DOSE,) 2 MG/1.5ML SOPN Inject 0.5 mg into the skin once a week. 1.5 mL 3   No current facility-administered medications for this visit.    Allergies as of 08/05/2020 - Review Complete 08/05/2020  Allergen Reaction Noted  . Codeine  10/28/2007  . Morphine  10/28/2007  . Other  05/13/2014    Family History  Problem Relation Age of Onset  . Cancer Mother   . Liver cancer Mother   . Cancer Father   . Diabetes Father   . Bladder Cancer Father   . Colon cancer Neg Hx   . Esophageal cancer Neg Hx   . Pancreatic cancer Neg Hx   . Stomach cancer Neg Hx     Social History   Socioeconomic History  . Marital status: Widowed    Spouse name: Not on file  . Number of children: Not on file  . Years of education: Not on file  . Highest education level: Not on file  Occupational History  . Not on file  Tobacco Use  . Smoking status: Current Every Day Smoker    Packs/day: 1.00  . Smokeless tobacco: Never Used  Vaping Use  . Vaping Use: Never used  Substance  and Sexual Activity  . Alcohol use: Yes  . Drug use: Yes    Types: Marijuana  . Sexual activity: Not on file  Other Topics Concern  . Not on file  Social History Narrative  . Not on file   Social Determinants of Health   Financial Resource Strain: Not on file  Food Insecurity: Not on file  Transportation Needs: Not on file  Physical Activity: Not on file  Stress: Not on file  Social Connections: Not on file  Intimate Partner Violence: Not on file     Physical Exam: BP (!) 184/82 (BP Location: Left Arm, Patient Position: Sitting, Cuff Size: Normal)   Pulse 66   Ht 5' 2.5" (1.588 m)   Wt 195 lb 6 oz (88.6 kg)   BMI 35.17 kg/m  Constitutional: generally well-appearing Psychiatric: alert and oriented x3 Eyes: extraocular movements intact Mouth: oral pharynx moist, no lesions Neck: supple no lymphadenopathy Cardiovascular: heart regular rate and rhythm Lungs: clear to auscultation bilaterally Abdomen: soft, nontender, nondistended, no obvious ascites, no peritoneal signs, normal bowel sounds Extremities: no lower extremity edema bilaterally Skin: no lesions on visible extremities   Assessment and plan: 65 y.o. 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  Certainly need to rule out inflammatory bowel disease, celiac sprue, pancreatic insufficiency, etc.  I recommended a battery of blood tests including CBC, complete metabolic profile, inflammatory markers, celiac sprue testing with serologies, TSH, pancreatic fecal elastase.  She also needs a colonoscopy.  In the meantime she will start taking a single schedule Imodium every morning shortly after she wakes up.  Please see the "Patient Instructions" section for addition details about the plan.   Owens Loffler, MD [facility] 08/05/2020, 8:59 AM  Cc: Dettinger, Fransisca Kaufmann, MD  Total time on date of encounter was 45 minutes (this included time spent  preparing to see the patient reviewing records; obtaining and/or reviewing separately obtained history; performing a medically appropriate exam and/or evaluation; counseling and educating the patient and family if present; ordering medications, tests or procedures if applicable; and documenting clinical information in the health record).

## 2020-08-05 NOTE — Patient Instructions (Addendum)
If you are age 65 or older, your body mass index should be between 23-30. Your Body mass index is 35.17 kg/m. If this is out of the aforementioned range listed, please consider follow up with your Primary Care Provider.  It has been recommended to you by your physician that you have a(n) colonoscopy completed. Per your request, we did not schedule the procedure(s) today. Please contact our office at [phone] should you decide to have the procedure completed. You will be scheduled for a pre-visit and procedure at that time.  Your provider has requested that you go to the basement level for lab work before leaving today. Press "B" on the elevator. The lab is located at the first door on the left as you exit the elevator.  Due to recent changes in healthcare laws, you may see the results of your imaging and laboratory studies on MyChart before your provider has had a chance to review them.  We understand that in some cases there may be results that are confusing or concerning to you. Not all laboratory results come back in the same time frame and the provider may be waiting for multiple results in order to interpret others.  Please give [place] 48 hours in order for your provider to thoroughly review all the results before contacting the office for clarification of your results.   The [facility] providers would like to encourage you to use [facility] to communicate with providers for non-urgent requests or questions.  Due to long hold times on the telephone, sending your provider a message by [facility] may be a faster and more efficient way to get a response.  Please allow 48 business hours for a response.  Please remember that this is for non-urgent requests.   Please decrease daily caffeine intake.  Please purchase the following medications over the counter and take as directed:  START: Imodium one pill every morning.  Thank you for entrusting me with your care and choosing [facility].  Dr  Ardis Hughs

## 2020-08-08 LAB — TISSUE TRANSGLUTAMINASE, IGA: (tTG) Ab, IgA: <1 U/mL

## 2020-08-08 LAB — IGA: Immunoglobulin A: 370 mg/dL — ABNORMAL HIGH (ref 70–320)

## 2020-08-10 ENCOUNTER — Encounter: Payer: Self-pay | Admitting: Gastroenterology

## 2020-08-10 ENCOUNTER — Other Ambulatory Visit: Payer: Medicare Other

## 2020-08-10 DIAGNOSIS — R197 Diarrhea, unspecified: Secondary | ICD-10-CM

## 2020-08-12 ENCOUNTER — Other Ambulatory Visit: Payer: Self-pay | Admitting: Obstetrics & Gynecology

## 2020-08-12 ENCOUNTER — Telehealth: Payer: Self-pay | Admitting: Family Medicine

## 2020-08-12 DIAGNOSIS — E1169 Type 2 diabetes mellitus with other specified complication: Secondary | ICD-10-CM

## 2020-08-12 DIAGNOSIS — E669 Obesity, unspecified: Secondary | ICD-10-CM

## 2020-08-12 DIAGNOSIS — I10 Essential (primary) hypertension: Secondary | ICD-10-CM

## 2020-08-12 MED ORDER — OZEMPIC (0.25 OR 0.5 MG/DOSE) 2 MG/1.5ML ~~LOC~~ SOPN: 0.5000 mg | PEN_INJECTOR | SUBCUTANEOUS | 1 refills | Status: DC

## 2020-08-12 MED ORDER — AMLODIPINE BESYLATE 5 MG PO TABS: 5.0000 mg | ORAL_TABLET | Freq: Every day | ORAL | 1 refills | Status: DC

## 2020-08-12 MED ORDER — LISINOPRIL 20 MG PO TABS: 40.0000 mg | ORAL_TABLET | Freq: Every day | ORAL | 1 refills | Status: DC

## 2020-08-12 NOTE — Telephone Encounter (Signed)
Sent all 3 for 90 days

## 2020-08-12 NOTE — Telephone Encounter (Signed)
  Prescription Request  08/12/2020  What is the name of the medication or equipment?  Semaglutide,0.25 or 0.5MG /DOS, (OZEMPIC, 0.25 OR 0.5 MG/DOSE,) 2 MG/1.5ML SOPN lisinopril (ZESTRIL) 20 MG tablet amLODipine (NORVASC) 5 MG tablet  Have you contacted your pharmacy to request a refill? (if applicable) no pt wants a 90 day supply on all --will be the same price for 30 day  She has new insurance  Which pharmacy would you like this sent to? [facility]   Patient notified that their request is being sent to the clinical staff for review and that they should receive a response within 2 business days.

## 2020-08-15 LAB — PANCREATIC ELASTASE, FECAL: Pancreatic Elastase-1, Stool: 218 mcg/g

## 2020-08-31 DIAGNOSIS — Z961 Presence of intraocular lens: Secondary | ICD-10-CM | POA: Diagnosis not present

## 2020-08-31 DIAGNOSIS — E119 Type 2 diabetes mellitus without complications: Secondary | ICD-10-CM | POA: Diagnosis not present

## 2020-09-06 DIAGNOSIS — E113411 Type 2 diabetes mellitus with severe nonproliferative diabetic retinopathy with macular edema, right eye: Secondary | ICD-10-CM | POA: Diagnosis not present

## 2020-09-06 DIAGNOSIS — H35033 Hypertensive retinopathy, bilateral: Secondary | ICD-10-CM | POA: Diagnosis not present

## 2020-09-06 DIAGNOSIS — H43813 Vitreous degeneration, bilateral: Secondary | ICD-10-CM | POA: Diagnosis not present

## 2020-09-06 DIAGNOSIS — E113312 Type 2 diabetes mellitus with moderate nonproliferative diabetic retinopathy with macular edema, left eye: Secondary | ICD-10-CM | POA: Diagnosis not present

## 2020-09-08 ENCOUNTER — Other Ambulatory Visit (HOSPITAL_COMMUNITY)
Admission: RE | Admit: 2020-09-08 | Discharge: 2020-09-08 | Disposition: A | Discharge status: Home / Self Care | Payer: Medicare HMO | Source: Ambulatory Visit | Attending: Obstetrics & Gynecology | Admitting: Obstetrics & Gynecology

## 2020-09-08 ENCOUNTER — Ambulatory Visit (INDEPENDENT_AMBULATORY_CARE_PROVIDER_SITE_OTHER): Payer: Medicare HMO | Admitting: Obstetrics & Gynecology

## 2020-09-08 ENCOUNTER — Encounter: Payer: Self-pay | Admitting: Obstetrics & Gynecology

## 2020-09-08 ENCOUNTER — Other Ambulatory Visit: Payer: Self-pay

## 2020-09-08 VITALS — BP 158/74 | HR 61 | Ht 62.0 in | Wt 192.0 lb

## 2020-09-08 DIAGNOSIS — Z01419 Encounter for gynecological examination (general) (routine) without abnormal findings: Secondary | ICD-10-CM | POA: Diagnosis not present

## 2020-09-08 DIAGNOSIS — Z1151 Encounter for screening for human papillomavirus (HPV): Secondary | ICD-10-CM | POA: Diagnosis not present

## 2020-09-08 DIAGNOSIS — R69 Illness, unspecified: Secondary | ICD-10-CM | POA: Diagnosis not present

## 2020-09-08 DIAGNOSIS — Z1231 Encounter for screening mammogram for malignant neoplasm of breast: Secondary | ICD-10-CM | POA: Diagnosis not present

## 2020-09-08 NOTE — Progress Notes (Signed)
   WELL-WOMAN EXAMINATION Patient name: Kimberly Donaldson MRN [id number]  Date of birth: 10-Jul-1955 Chief Complaint:   Gynecologic Exam  History of Present Illness:   Kimberly Donaldson is a 65 y.o. [id number] postmenopausal female being seen today for a routine well-woman exam.  Today she notes: no acute complaints.  On occasion has stress incontinence- wears small pad and overall does not bother her.  No LMP recorded. Patient is postmenopausal.  Last pap about 5-40yrs ago.  Last mammogram: ordered. Last colonoscopy: scheduled for August  Expecting grandbaby in August, also takes care of 3yo so life is busy ina good way.  Depression screen [facility] 2/9 09/08/2020 07/11/2020 06/24/2020 03/15/2020 02/26/2020  Decreased Interest 0 0 0 0 0  Down, Depressed, Hopeless 0 0 0 0 0  PHQ - 2 Score 0 0 0 0 0  Altered sleeping 1 - - - -  Tired, decreased energy 1 - - - -  Change in appetite 1 - - - -  Feeling bad or failure about yourself  - - - - -  Trouble concentrating 0 - - - -  Moving slowly or fidgety/restless 0 - - - -  Suicidal thoughts 0 - - - -  PHQ-9 Score 3 - - - -  Some recent data might be hidden    cReview of Systems:   Pertinent items are noted in HPI Denies any headaches, blurred vision, fatigue, shortness of breath, chest pain, abdominal pain, bowel movements, urination, or intercourse unless otherwise stated above.  Pertinent History Reviewed:  Reviewed past medical,surgical, social and family history.  Reviewed problem list, medications and allergies. Physical Assessment:   Vitals:   09/08/20 1545  BP: (!) 158/74  Pulse: 61  Weight: 192 lb (87.1 kg)  Height: 5\' 2"  (1.575 m)  Body mass index is 35.12 kg/m.        Physical Examination:   General appearance - well appearing, and in no distress  Mental status - alert, oriented to person, place, and time  Psych:  She has a normal mood and affect  Skin - warm and dry, normal color, no suspicious lesions noted  Chest - effort  normal, all lung fields clear to auscultation bilaterally  Heart - normal rate and regular rhythm  Neck:  midline trachea, no thyromegaly or nodules  Breasts - breasts appear normal, no suspicious masses, no skin or nipple changes or  axillary nodes  Abdomen - soft, nontender, nondistended, umbilical hernia noted- long standing concern  Pelvic - VULVA: normal appearing vulva with no masses, tenderness or lesions, small sebaceous cysts noted- asymptomatic VAGINA: normal appearing vagina with normal color and discharge, no lesions  CERVIX: normal appearing cervix without discharge or lesions, no CMT  Thin prep pap is done with HR HPV cotesting  UTERUS: uterus is felt to be normal size, shape, consistency and nontender   ADNEXA: No adnexal masses or tenderness noted.  Extremities:  No swelling or varicosities noted  Chaperone:  pt declined      Assessment & Plan:  1) Well-Woman Exam Pap collected, reviewed screening guidelines Mammogram ordered Colonoscopy already scheduled  Orders Placed This Encounter  Procedures   MM 3D SCREEN BREAST BILATERAL    Meds: No orders of the defined types were placed in this encounter.   Follow-up: Return in about 1 year (around 09/08/2021) for Annual.   Janyth Pupa, DO Attending [place], [place] [facility], [place]

## 2020-09-08 NOTE — Patient Instructions (Signed)
Please schedule a mammogram at one of the following locations:  [facility]: [phone]  [facility] in [place]:[phone] [address]  

## 2020-09-13 LAB — CYTOLOGY - PAP
Comment: NEGATIVE
Diagnosis: NEGATIVE
High risk HPV: NEGATIVE

## 2020-09-27 DIAGNOSIS — E113312 Type 2 diabetes mellitus with moderate nonproliferative diabetic retinopathy with macular edema, left eye: Secondary | ICD-10-CM | POA: Diagnosis not present

## 2020-09-27 DIAGNOSIS — E113411 Type 2 diabetes mellitus with severe nonproliferative diabetic retinopathy with macular edema, right eye: Secondary | ICD-10-CM | POA: Diagnosis not present

## 2020-10-10 DIAGNOSIS — H359 Unspecified retinal disorder: Secondary | ICD-10-CM | POA: Diagnosis not present

## 2020-10-10 DIAGNOSIS — R32 Unspecified urinary incontinence: Secondary | ICD-10-CM | POA: Diagnosis not present

## 2020-10-10 DIAGNOSIS — Z720 Tobacco use: Secondary | ICD-10-CM | POA: Diagnosis not present

## 2020-10-10 DIAGNOSIS — I951 Orthostatic hypotension: Secondary | ICD-10-CM | POA: Diagnosis not present

## 2020-10-10 DIAGNOSIS — I10 Essential (primary) hypertension: Secondary | ICD-10-CM | POA: Diagnosis not present

## 2020-10-10 DIAGNOSIS — Z79899 Other long term (current) drug therapy: Secondary | ICD-10-CM | POA: Diagnosis not present

## 2020-10-10 DIAGNOSIS — Z6835 Body mass index (BMI) 35.0-35.9, adult: Secondary | ICD-10-CM | POA: Diagnosis not present

## 2020-10-10 DIAGNOSIS — Z809 Family history of malignant neoplasm, unspecified: Secondary | ICD-10-CM | POA: Diagnosis not present

## 2020-10-10 DIAGNOSIS — E1151 Type 2 diabetes mellitus with diabetic peripheral angiopathy without gangrene: Secondary | ICD-10-CM | POA: Diagnosis not present

## 2020-10-18 ENCOUNTER — Telehealth: Payer: Self-pay | Admitting: *Deleted

## 2020-10-18 NOTE — Telephone Encounter (Signed)
Unable to reach patient for virtual pre visit. Call went straight to voicemail x 2. Requested patient call back or reschedule pre visit to avoid cancellation of up-coming colonoscopy.

## 2020-10-19 ENCOUNTER — Other Ambulatory Visit: Payer: Self-pay

## 2020-10-19 ENCOUNTER — Ambulatory Visit (AMBULATORY_SURGERY_CENTER): Payer: Medicare HMO | Admitting: *Deleted

## 2020-10-19 VITALS — Ht 62.0 in | Wt 193.0 lb

## 2020-10-19 DIAGNOSIS — Z1211 Encounter for screening for malignant neoplasm of colon: Secondary | ICD-10-CM

## 2020-10-19 NOTE — Progress Notes (Signed)
Patient's pre-visit was done today over the phone with the patient due to COVID-19 pandemic. Name,DOB and address verified. Insurance verified. Patient denies any allergies to Eggs and Soy. Patient denies any problems with anesthesia/sedation. Patient denies taking diet pills or blood thinners. No home Oxygen. Packet of Prep instructions mailed to patient including a copy of a consent form-pt is aware. Patient understands to call us back with any questions or concerns. Patient is aware of our care-partner policy and Covid-19 safety protocol.   EMMI education assigned to the patient for the procedure, sent to MyChart.   

## 2020-10-21 ENCOUNTER — Ambulatory Visit (INDEPENDENT_AMBULATORY_CARE_PROVIDER_SITE_OTHER): Payer: Medicare HMO | Admitting: Nurse Practitioner

## 2020-10-21 ENCOUNTER — Encounter: Payer: Self-pay | Admitting: Nurse Practitioner

## 2020-10-21 DIAGNOSIS — U071 COVID-19: Secondary | ICD-10-CM | POA: Insufficient documentation

## 2020-10-21 MED ORDER — NIRMATRELVIR/RITONAVIR (PAXLOVID)TABLET: 3.0000 | ORAL_TABLET | Freq: Two times a day (BID) | ORAL | 0 refills | Status: AC

## 2020-10-21 NOTE — Progress Notes (Signed)
   Virtual Visit  Note Due to COVID-19 pandemic this visit was conducted virtually. This visit type was conducted due to national recommendations for restrictions regarding the COVID-19 Pandemic (e.g. social distancing, sheltering in place) in an effort to limit this patient's exposure and mitigate transmission in our community. All issues noted in this document were discussed and addressed.  A physical exam was not performed with this format.  I connected with Kimberly Donaldson on 10/21/20 at 10:35 AM by telephone and verified that I am speaking with the correct person using two identifiers. Kimberly Donaldson is currently located at home during visit. The provider, Ivy Lynn, NP is located in their office at time of visit.  I discussed the limitations, risks, security and privacy concerns of performing an evaluation and management service by telephone and the availability of in person appointments. I also discussed with the patient that there may be a patient responsible charge related to this service. The patient expressed understanding and agreed to proceed.   History and Present Illness:  HPI  Patient positive for COVID-19 this morning with symptoms of runny nose, scratchy throat, headache and cough   Review of Systems  Constitutional:  Positive for malaise/fatigue. Negative for chills and fever.  HENT: Negative.    Respiratory: Negative.    Cardiovascular: Negative.   Skin:  Negative for rash.  All other systems reviewed and are negative.   Observations/Objective: Televisit patient not in distress.  Assessment and Plan: Patient tested positive this morning for COVID-19.,  Symptoms of runny nose, scratchy throat, and headache. Started patient on antiviral Paxlovid.  Education provided to patient advised to increase hydration, monitor oxygen saturations, Tylenol for fever and body aches..   Rx sent to pharmacy.  Follow Up Instructions: Follow-up with worsening unresolved  symptoms.    I discussed the assessment and treatment plan with the patient. The patient was provided an opportunity to ask questions and all were answered. The patient agreed with the plan and demonstrated an understanding of the instructions.   The patient was advised to call back or seek an in-person evaluation if the symptoms worsen or if the condition fails to improve as anticipated.  The above assessment and management plan was discussed with the patient. The patient verbalized understanding of and has agreed to the management plan. Patient is aware to call the clinic if symptoms persist or worsen. Patient is aware when to return to the clinic for a follow-up visit. Patient educated on when it is appropriate to go to the emergency department.   Time call ended: 10:45 AM  I provided 10 minutes of  non face-to-face time during this encounter.    Ivy Lynn, NP

## 2020-10-21 NOTE — Assessment & Plan Note (Signed)
Patient tested positive this morning for COVID-19.,  Symptoms of runny nose, scratchy throat, and headache. Started patient on antiviral Paxlovid.  Education provided to patient advised to increase hydration, monitor oxygen saturations, Tylenol for fever and body aches.

## 2020-11-01 ENCOUNTER — Encounter: Payer: Self-pay | Admitting: Gastroenterology

## 2020-11-01 ENCOUNTER — Ambulatory Visit (AMBULATORY_SURGERY_CENTER): Payer: Medicare HMO | Admitting: Gastroenterology

## 2020-11-01 VITALS — BP 120/65 | HR 66 | Temp 97.6°F | Resp 14 | Ht 62.0 in | Wt 193.0 lb

## 2020-11-01 DIAGNOSIS — E119 Type 2 diabetes mellitus without complications: Secondary | ICD-10-CM | POA: Diagnosis not present

## 2020-11-01 DIAGNOSIS — K529 Noninfective gastroenteritis and colitis, unspecified: Secondary | ICD-10-CM

## 2020-11-01 DIAGNOSIS — I10 Essential (primary) hypertension: Secondary | ICD-10-CM | POA: Diagnosis not present

## 2020-11-01 DIAGNOSIS — D122 Benign neoplasm of ascending colon: Secondary | ICD-10-CM

## 2020-11-01 DIAGNOSIS — K5730 Diverticulosis of large intestine without perforation or abscess without bleeding: Secondary | ICD-10-CM | POA: Diagnosis not present

## 2020-11-01 DIAGNOSIS — Z1211 Encounter for screening for malignant neoplasm of colon: Secondary | ICD-10-CM

## 2020-11-01 MED ORDER — SODIUM CHLORIDE 0.9 % IV SOLN: 500.0000 mL | Freq: Once | INTRAVENOUS | Status: DC

## 2020-11-01 NOTE — Progress Notes (Signed)
PT taken to PACU. Monitors in place. VSS. Report given to RN. 

## 2020-11-01 NOTE — Progress Notes (Signed)
Pt's states no medical or surgical changes since previsit or office visit. 

## 2020-11-01 NOTE — Patient Instructions (Signed)
Read all fo the instructions given to you by your recovery room nurse.  Try to quit smoking.  YOU HAD AN ENDOSCOPIC PROCEDURE TODAY AT [facility]:   Refer to the procedure report that was given to you for any specific questions about what was found during the examination.  If the procedure report does not answer your questions, please call your gastroenterologist to clarify.  If you requested that your care partner not be given the details of your procedure findings, then the procedure report has been included in a sealed envelope for you to review at your convenience later.  YOU SHOULD EXPECT: Some feelings of bloating in the abdomen. Passage of more gas than usual.  Walking can help get rid of the air that was put into your GI tract during the procedure and reduce the bloating. If you had a lower endoscopy (such as a colonoscopy or flexible sigmoidoscopy) you may notice spotting of blood in your stool or on the toilet paper. If you underwent a bowel prep for your procedure, you may not have a normal bowel movement for a few days.  Please Note:  You might notice some irritation and congestion in your nose or some drainage.  This is from the oxygen used during your procedure.  There is no need for concern and it should clear up in a day or so.  SYMPTOMS TO REPORT IMMEDIATELY:  Following lower endoscopy (colonoscopy or flexible sigmoidoscopy):  Excessive amounts of blood in the stool  Significant tenderness or worsening of abdominal pains  Swelling of the abdomen that is new, acute  Fever of 100F or higher  For urgent or emergent issues, a gastroenterologist can be reached at any hour by calling [phone]. Do not use MyChart messaging for urgent concerns.    DIET:  We do recommend a small meal at first, but then you may proceed to your regular diet.  Drink plenty of fluids but you should avoid alcoholic beverages for 24 hours. Try to increase the fiber in your diet, and  drink plenty of water.  ACTIVITY:  You should plan to take it easy for the rest of today and you should NOT DRIVE or use heavy machinery until tomorrow (because of the sedation medicines used during the test).    FOLLOW UP: Our staff will call the number listed on your records 48-72 hours following your procedure to check on you and address any questions or concerns that you may have regarding the information given to you following your procedure. If we do not reach you, we will leave a message.  We will attempt to reach you two times.  During this call, we will ask if you have developed any symptoms of COVID 19. If you develop any symptoms (ie: fever, flu-like symptoms, shortness of breath, cough etc.) before then, please call [phone].  If you test positive for Covid 19 in the 2 weeks post procedure, please call and report this information to [place].    If any biopsies were taken you will be contacted by phone or by letter within the next 1-3 weeks.  Please call us at [phone] if you have not heard about the biopsies in 3 weeks.    SIGNATURES/CONFIDENTIALITY: You and/or your care partner have signed paperwork which will be entered into your electronic medical record.  These signatures attest to the fact that that the information above on your After Visit Summary has been reviewed and is understood.  Full responsibility of  the confidentiality of this discharge information lies with you and/or your care-partner.

## 2020-11-01 NOTE — Progress Notes (Signed)
HPI: This is a woman with chronic loose stools   ROS: complete GI ROS as described in HPI, all other review negative.  Constitutional:  No unintentional weight loss   Past Medical History:  Diagnosis Date   Diabetes mellitus without complication ([place])    Hypertension    Retina disorder, bilateral     Past Surgical History:  Procedure Laterality Date   CESAREAN SECTION     COLONOSCOPY  11/11/2007   Dr.Pete Schnitzer   FOOT SURGERY     WRIST SURGERY      Current Outpatient Medications  Medication Sig Dispense Refill   amLODipine (NORVASC) 5 MG tablet Take 1 tablet (5 mg total) by mouth daily. 90 tablet 1   aspirin EC 81 MG tablet Take 81 mg by mouth daily. Swallow whole.     lisinopril (ZESTRIL) 20 MG tablet Take 2 tablets (40 mg total) by mouth daily. 180 tablet 1   Multiple Vitamins-Minerals (AIRBORNE PO) Take 1 tablet by mouth daily.     Omega-3 Fatty Acids (FISH OIL PO) Take 1 tablet by mouth daily.     Semaglutide,0.25 or 0.'5MG'$ /DOS, (OZEMPIC, 0.25 OR 0.5 MG/DOSE,) 2 MG/1.5ML SOPN Inject 0.5 mg into the skin once a week. 4.5 mL 1   cyclobenzaprine (FLEXERIL) 10 MG tablet TAKE 1 TABLET THREE TIMES DAILY AS NEEDED FOR MUSCLE SPASM 60 tablet 0   ibuprofen (ADVIL,MOTRIN) 200 MG tablet Take 800 mg by mouth every 6 (six) hours as needed for moderate pain.     loperamide (IMODIUM) 2 MG capsule Take by mouth as needed for diarrhea or loose stools.     tobramycin (TOBREX) 0.3 % ophthalmic solution 1 drop 4 (four) times daily.     Current Facility-Administered Medications  Medication Dose Route Frequency Provider Last Rate Last Admin   0.9 %  sodium chloride infusion  500 mL Intravenous Once Milus Banister, MD        Allergies as of 11/01/2020 - Review Complete 11/01/2020  Allergen Reaction Noted   Codeine  10/28/2007   Morphine  10/28/2007   Other  05/13/2014    Family History  Problem Relation Age of Onset   Cancer Mother    Liver cancer Mother    Colon polyps Father     Cancer Father    Diabetes Father    Bladder Cancer Father    Colon cancer Neg Hx    Esophageal cancer Neg Hx    Pancreatic cancer Neg Hx    Stomach cancer Neg Hx     Social History   Socioeconomic History   Marital status: Widowed    Spouse name: Not on file   Number of children: Not on file   Years of education: Not on file   Highest education level: Not on file  Occupational History   Not on file  Tobacco Use   Smoking status: Every Day    Packs/day: 1.00    Types: Cigarettes   Smokeless tobacco: Never  Vaping Use   Vaping Use: Never used  Substance and Sexual Activity   Alcohol use: Yes    Comment: occ   Drug use: Yes    Types: Marijuana    Comment: gummies every 2-3 months   Sexual activity: Not Currently    Birth control/protection: Post-menopausal  Other Topics Concern   Not on file  Social History Narrative   Not on file   Social Determinants of Health   Financial Resource Strain: Low Risk    Difficulty of Paying  Living Expenses: Not very hard  Food Insecurity: No Food Insecurity   Worried About [place] in the Last Year: Never true   Ran Out of Food in the Last Year: Never true  Transportation Needs: No Transportation Needs   Lack of Transportation (Medical): No   Lack of Transportation (Non-Medical): No  Physical Activity: Inactive   Days of Exercise per Week: 0 days   Minutes of Exercise per Session: 0 min  Stress: No Stress Concern Present   Feeling of Stress : Only a little  Social Connections: Moderately Integrated   Frequency of Communication with Friends and Family: More than three times a week   Frequency of Social Gatherings with Friends and Family: Once a week   Attends Religious Services: More than 4 times per year   Active Member of [employer] or Organizations: Yes   Attends Archivist Meetings: More than 4 times per year   Marital Status: Widowed  Human resources officer Violence: Not At Risk   Fear of Current or Ex-Partner:  No   Emotionally Abused: No   Physically Abused: No   Sexually Abused: No     Physical Exam: BP (!) 141/51   Pulse 67   Temp 97.6 F (36.4 C)   Ht '5\' 2"'$  (1.575 m)   Wt 193 lb (87.5 kg)   SpO2 98%   BMI 35.30 kg/m  Constitutional: generally well-appearing Psychiatric: alert and oriented x3 Lungs: CTA bilaterally Heart: no MCR  Assessment and plan: 65 y.o. female with chronic loose stools  Colonoscopy today  Care is appropriate for the ambulatory setting.  Owens Loffler, MD [facility] 11/01/2020, 10:00 AM

## 2020-11-01 NOTE — Op Note (Signed)
[facility] Patient Name: Kimberly Donaldson Procedure Date: 11/01/2020 9:54 AM MRN: [id number] Endoscopist: Milus Banister , MD Age: 65 Referring MD:  Date of Birth: May 25, 1955 Gender: Female Account #: [id number] Procedure:                Colonoscopy Indications:              Chronic diarrhea, improved since cutting back                            caffeine and taking imodium PRN Medicines:                Monitored Anesthesia Care Procedure:                Pre-Anesthesia Assessment:                           - Prior to the procedure, a History and Physical                            was performed, and patient medications and                            allergies were reviewed. The patient's tolerance of                            previous anesthesia was also reviewed. The risks                            and benefits of the procedure and the sedation                            options and risks were discussed with the patient.                            All questions were answered, and informed consent                            was obtained. Prior Anticoagulants: The patient has                            taken no previous anticoagulant or antiplatelet                            agents. ASA Grade Assessment: II - A patient with                            mild systemic disease. After reviewing the risks                            and benefits, the patient was deemed in                            satisfactory condition to undergo the procedure.  After obtaining informed consent, the colonoscope                            was passed under direct vision. Throughout the                            procedure, the patient's blood pressure, pulse, and                            oxygen saturations were monitored continuously. The                            CF [id number] was introduced through the anus                            and advanced to the the  terminal ileum. The                            colonoscopy was performed without difficulty. The                            patient tolerated the procedure well. The quality                            of the bowel preparation was good. The terminal                            ileum, ileocecal valve, appendiceal orifice, and                            rectum were photographed. Scope In: 10:06:42 AM Scope Out: 10:17:57 AM Scope Withdrawal Time: 0 hours 8 minutes 22 seconds  Total Procedure Duration: 0 hours 11 minutes 15 seconds  Findings:                 The terminal ileum appeared normal.                           A 4 mm polyp was found in the ascending colon. The                            polyp was sessile. The polyp was removed with a                            cold snare. Resection and retrieval were complete.                           Biopsies for histology were taken with a cold                            forceps from the entire colon for evaluation of                            microscopic colitis.  Multiple small and large-mouthed diverticula were                            found in the entire colon.                           The exam was otherwise without abnormality on                            direct and retroflexion views. Complications:            No immediate complications. Estimated blood loss:                            None. Estimated Blood Loss:     Estimated blood loss: none. Impression:               - The examined portion of the ileum was normal.                           - One 4 mm polyp in the ascending colon, removed                            with a cold snare. Resected and retrieved.                           - Diverticulosis in the entire examined colon.                           - The examination was otherwise normal on direct                            and retroflexion views.                           - Biopsies were taken with a  cold forceps from the                            entire colon for evaluation of microscopic colitis. Recommendation:           - Patient has a contact number available for                            emergencies. The signs and symptoms of potential                            delayed complications were discussed with the                            patient. Return to normal activities tomorrow.                            Written discharge instructions were provided to the  patient.                           - Resume previous diet.                           - Continue present medications.                           - Await pathology results. Milus Banister, MD 11/01/2020 10:20:45 AM This report has been signed electronically.

## 2020-11-01 NOTE — Progress Notes (Signed)
VS taken by C.W. 

## 2020-11-01 NOTE — Progress Notes (Signed)
Called to room to assist during endoscopic procedure.  Patient ID and intended procedure confirmed with present staff. Received instructions for my participation in the procedure from the performing physician.  

## 2020-11-03 ENCOUNTER — Telehealth: Payer: Self-pay | Admitting: *Deleted

## 2020-11-03 NOTE — Telephone Encounter (Signed)
Follow up call made. 

## 2020-11-08 ENCOUNTER — Encounter: Payer: Self-pay | Admitting: Gastroenterology

## 2020-11-16 ENCOUNTER — Ambulatory Visit
Admission: RE | Admit: 2020-11-16 | Discharge: 2020-11-16 | Disposition: A | Discharge status: Home / Self Care | Payer: Medicare HMO | Source: Ambulatory Visit | Attending: Family Medicine | Admitting: Family Medicine

## 2020-11-16 ENCOUNTER — Other Ambulatory Visit: Payer: Self-pay

## 2020-11-16 DIAGNOSIS — Z1231 Encounter for screening mammogram for malignant neoplasm of breast: Secondary | ICD-10-CM

## 2020-11-23 DIAGNOSIS — E113411 Type 2 diabetes mellitus with severe nonproliferative diabetic retinopathy with macular edema, right eye: Secondary | ICD-10-CM | POA: Diagnosis not present

## 2020-11-23 DIAGNOSIS — E113312 Type 2 diabetes mellitus with moderate nonproliferative diabetic retinopathy with macular edema, left eye: Secondary | ICD-10-CM | POA: Diagnosis not present

## 2020-11-23 DIAGNOSIS — H43813 Vitreous degeneration, bilateral: Secondary | ICD-10-CM | POA: Diagnosis not present

## 2020-12-26 ENCOUNTER — Ambulatory Visit (INDEPENDENT_AMBULATORY_CARE_PROVIDER_SITE_OTHER): Payer: Medicare HMO | Admitting: Family Medicine

## 2020-12-26 ENCOUNTER — Other Ambulatory Visit: Payer: Self-pay

## 2020-12-26 ENCOUNTER — Encounter: Payer: Self-pay | Admitting: Family Medicine

## 2020-12-26 VITALS — BP 152/55 | HR 63 | Ht 62.0 in | Wt 195.0 lb

## 2020-12-26 DIAGNOSIS — E669 Obesity, unspecified: Secondary | ICD-10-CM | POA: Diagnosis not present

## 2020-12-26 DIAGNOSIS — E781 Pure hyperglyceridemia: Secondary | ICD-10-CM

## 2020-12-26 DIAGNOSIS — I10 Essential (primary) hypertension: Secondary | ICD-10-CM

## 2020-12-26 DIAGNOSIS — I739 Peripheral vascular disease, unspecified: Secondary | ICD-10-CM

## 2020-12-26 DIAGNOSIS — E1169 Type 2 diabetes mellitus with other specified complication: Secondary | ICD-10-CM | POA: Diagnosis not present

## 2020-12-26 LAB — BAYER DCA HB A1C WAIVED: HB A1C (BAYER DCA - WAIVED): 5.8 % — ABNORMAL HIGH (ref 4.8–5.6)

## 2020-12-26 MED ORDER — ROSUVASTATIN CALCIUM 5 MG PO TABS: 5.0000 mg | ORAL_TABLET | Freq: Every day | ORAL | 3 refills | Status: DC

## 2020-12-26 MED ORDER — OZEMPIC (0.25 OR 0.5 MG/DOSE) 2 MG/1.5ML ~~LOC~~ SOPN: 0.5000 mg | PEN_INJECTOR | SUBCUTANEOUS | 3 refills | Status: DC

## 2020-12-26 MED ORDER — LISINOPRIL 20 MG PO TABS: 40.0000 mg | ORAL_TABLET | Freq: Every day | ORAL | 3 refills | Status: DC

## 2020-12-26 MED ORDER — AMLODIPINE BESYLATE 5 MG PO TABS: 5.0000 mg | ORAL_TABLET | Freq: Every day | ORAL | 3 refills | Status: DC

## 2020-12-26 NOTE — Progress Notes (Signed)
BP (!) 152/55   Pulse 63   Ht _0  (1.575 m)   Wt 195 lb (88.5 kg)   SpO2 100%   BMI 35.67 kg/m    Subjective:   Patient ID: Kimberly Donaldson, female    DOB: Nov 26, 1955, 65 y.o.   MRN: [id number]  HPI: Kimberly Donaldson is a 65 y.o. female presenting on 12/26/2020 for Medical Management of Chronic Issues, Hypertension, Diabetes, Shoulder Pain (left), and Leg Pain (left)   HPI Type 2 diabetes mellitus Patient comes in today for recheck of his diabetes. Patient has been currently taking Ozempic. Patient is currently on an ACE inhibitor/ARB. Patient has seen an ophthalmologist this year. Patient denies any issues with their feet. The symptom started onset as an adult hypertension and hypertriglyceridemia ARE RELATED TO DM   Hypertension Patient is currently on lisinopril and amlodipine, admits she did not take them this morning, and their blood pressure today is 152/55. Patient denies any lightheadedness or dizziness. Patient denies headaches, blurred vision, chest pains, shortness of breath, or weakness. Denies any side effects from medication and is content with current medication.   Hyperlipidemia and hypertriglyceridemia Patient is coming in for recheck of his hyperlipidemia. The patient is currently taking fish oil. They deny any issues with myalgias or history of liver damage from it. They deny any focal numbness or weakness or chest pain.   Patient describes symptoms in her calves of pain and achiness especially when walking.  She symptoms gets occasionally at night, worse in her left leg than the right.  She says that sometimes she can walk a mile without it hurting but then sometimes she cannot walk a few times across the yard without it hurting.  She says it is an achy discomfort.  She denies any swelling or redness or warmth or skin changes in her legs.  Patient says she has been having a knot under her right shoulder blade but it is better today.  She has been putting Biofreeze  and heat pad on it  Relevant past medical, surgical, family and social history reviewed and updated as indicated. Interim medical history since our last visit reviewed. Allergies and medications reviewed and updated.  Review of Systems  Constitutional:  Negative for chills and fever.  HENT:  Negative for congestion, ear discharge and ear pain.   Eyes:  Negative for visual disturbance.  Respiratory:  Negative for chest tightness and shortness of breath.   Cardiovascular:  Negative for chest pain and leg swelling.  Musculoskeletal:  Positive for arthralgias, back pain and myalgias. Negative for gait problem and joint swelling.  Skin:  Negative for rash.  Neurological:  Negative for light-headedness and headaches.  Psychiatric/Behavioral:  Negative for agitation and behavioral problems.   All other systems reviewed and are negative.  Per HPI unless specifically indicated above   Allergies as of 12/26/2020       Reactions   Codeine    REACTION: ringing in ears   Morphine    REACTION: hives   Other    sporinof - for nail fungus        Medication List        Accurate as of December 26, 2020  9:15 AM. If you have any questions, ask your nurse or doctor.          AIRBORNE PO Take 1 tablet by mouth daily.   amLODipine 5 MG tablet Commonly known as: NORVASC Take 1 tablet (5 mg total) by mouth daily.  aspirin EC 81 MG tablet Take 81 mg by mouth daily. Swallow whole.   cyclobenzaprine 10 MG tablet Commonly known as: FLEXERIL TAKE 1 TABLET THREE TIMES DAILY AS NEEDED FOR MUSCLE SPASM   FISH OIL PO Take 1 tablet by mouth daily.   ibuprofen 200 MG tablet Commonly known as: ADVIL Take 800 mg by mouth every 6 (six) hours as needed for moderate pain.   lisinopril 20 MG tablet Commonly known as: ZESTRIL Take 2 tablets (40 mg total) by mouth daily.   loperamide 2 MG capsule Commonly known as: IMODIUM Take by mouth as needed for diarrhea or loose stools.   Ozempic  (0.25 or 0.5 MG/DOSE) 2 MG/1.5ML Sopn Generic drug: Semaglutide(0.25 or 0.5MG/DOS) Inject 0.5 mg into the skin once a week.   rosuvastatin 5 MG tablet Commonly known as: Crestor Take 1 tablet (5 mg total) by mouth daily. Started by: Fransisca Kaufmann Rafe Mackowski, MD   tobramycin 0.3 % ophthalmic solution Commonly known as: TOBREX 1 drop 4 (four) times daily.         Objective:   BP (!) 152/55   Pulse 63   Ht _0  (1.575 m)   Wt 195 lb (88.5 kg)   SpO2 100%   BMI 35.67 kg/m   Wt Readings from Last 3 Encounters:  12/26/20 195 lb (88.5 kg)  11/01/20 193 lb (87.5 kg)  10/19/20 193 lb (87.5 kg)    Physical Exam Vitals and nursing note reviewed.  Constitutional:      General: She is not in acute distress.    Appearance: She is well-developed. She is not diaphoretic.  Eyes:     Conjunctiva/sclera: Conjunctivae normal.  Cardiovascular:     Rate and Rhythm: Normal rate and regular rhythm.     Heart sounds: Normal heart sounds. No murmur heard. Pulmonary:     Effort: Pulmonary effort is normal. No respiratory distress.     Breath sounds: Normal breath sounds. No wheezing.  Musculoskeletal:        General: Normal range of motion.     Cervical back: Normal.     Thoracic back: Normal.     Lumbar back: Normal.     Right lower leg: Bony tenderness present. No swelling, deformity or tenderness. No edema.     Left lower leg: Tenderness (Slight lower calf tenderness to palpation.  No overlying skin changes) present. No swelling, deformity or bony tenderness. No edema.  Skin:    General: Skin is warm and dry.     Findings: No lesion or rash.  Neurological:     Mental Status: She is alert and oriented to person, place, and time.     Coordination: Coordination normal.  Psychiatric:        Behavior: Behavior normal.      Assessment & Plan:   Problem List Items Addressed This Visit       Cardiovascular and Mediastinum   HTN (hypertension)   Relevant Medications   lisinopril  (ZESTRIL) 20 MG tablet   amLODipine (NORVASC) 5 MG tablet   rosuvastatin (CRESTOR) 5 MG tablet     Endocrine   DM2 (diabetes mellitus, type 2) (HCC) - Primary   Relevant Medications   lisinopril (ZESTRIL) 20 MG tablet   Semaglutide,0.25 or 0.5MG/DOS, (OZEMPIC, 0.25 OR 0.5 MG/DOSE,) 2 MG/1.5ML SOPN   rosuvastatin (CRESTOR) 5 MG tablet   Other Relevant Orders   CBC with Differential/Platelet   CMP14+EGFR   Lipid panel   TSH   Bayer DCA Hb A1c  Waived   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)   Relevant Medications   lisinopril (ZESTRIL) 20 MG tablet   Semaglutide,0.25 or 0.5MG/DOS, (OZEMPIC, 0.25 OR 0.5 MG/DOSE,) 2 MG/1.5ML SOPN   amLODipine (NORVASC) 5 MG tablet   rosuvastatin (CRESTOR) 5 MG tablet   Other Visit Diagnoses     Obesity (BMI 30.0-34.9)       Relevant Medications   Semaglutide,0.25 or 0.5MG/DOS, (OZEMPIC, 0.25 OR 0.5 MG/DOSE,) 2 MG/1.5ML SOPN   Other Relevant Orders   CBC with Differential/Platelet   CMP14+EGFR   Lipid panel   TSH   Bayer DCA Hb A1c Waived   Essential hypertension       Relevant Medications   lisinopril (ZESTRIL) 20 MG tablet   amLODipine (NORVASC) 5 MG tablet   rosuvastatin (CRESTOR) 5 MG tablet   Other Relevant Orders   CBC with Differential/Platelet   CMP14+EGFR   Lipid panel   TSH   Bayer DCA Hb A1c Waived   Intermittent claudication (HCC)           Will start Crestor to help with circulation and claudication.  Patient wants to wait on referral or testing for the claudication as it is only intermittent.  A1c is 5.8. Follow up plan: Return in about 6 months (around 06/26/2021), or if symptoms worsen or fail to improve, for Hypertension diabetes recheck.  Counseling provided for all of the vaccine components Orders Placed This Encounter  Procedures   CBC with Differential/Platelet   CMP14+EGFR   Lipid panel   TSH   Bayer DCA Hb A1c [place], MD [facility] 12/26/2020, 9:15 AM

## 2020-12-27 LAB — CBC WITH DIFFERENTIAL/PLATELET
Basophils Absolute: 0.1 10*3/uL (ref 0.0–0.2)
Basos: 1 %
EOS (ABSOLUTE): 0.2 10*3/uL (ref 0.0–0.4)
Eos: 2 %
Hematocrit: 44.3 % (ref 34.0–46.6)
Hemoglobin: 14.7 g/dL (ref 11.1–15.9)
Immature Grans (Abs): 0 10*3/uL (ref 0.0–0.1)
Immature Granulocytes: 0 %
Lymphocytes Absolute: 2.4 10*3/uL (ref 0.7–3.1)
Lymphs: 27 %
MCH: 29.3 pg (ref 26.6–33.0)
MCHC: 33.2 g/dL (ref 31.5–35.7)
MCV: 88 fL (ref 79–97)
Monocytes Absolute: 0.8 10*3/uL (ref 0.1–0.9)
Monocytes: 9 %
Neutrophils Absolute: 5.3 10*3/uL (ref 1.4–7.0)
Neutrophils: 61 %
Platelets: 231 10*3/uL (ref 150–450)
RBC: 5.01 x10E6/uL (ref 3.77–5.28)
RDW: 13.2 % (ref 11.7–15.4)
WBC: 8.8 10*3/uL (ref 3.4–10.8)

## 2020-12-27 LAB — CMP14+EGFR
ALT: 13 IU/L (ref 0–32)
AST: 16 IU/L (ref 0–40)
Albumin/Globulin Ratio: 1.9 (ref 1.2–2.2)
Albumin: 4.1 g/dL (ref 3.8–4.8)
Alkaline Phosphatase: 86 IU/L (ref 44–121)
BUN/Creatinine Ratio: 16 (ref 12–28)
BUN: 11 mg/dL (ref 8–27)
Bilirubin Total: 0.4 mg/dL (ref 0.0–1.2)
CO2: 22 mmol/L (ref 20–29)
Calcium: 8.9 mg/dL (ref 8.7–10.3)
Chloride: 105 mmol/L (ref 96–106)
Creatinine, Ser: 0.7 mg/dL (ref 0.57–1.00)
Globulin, Total: 2.2 g/dL (ref 1.5–4.5)
Glucose: 144 mg/dL — ABNORMAL HIGH (ref 70–99)
Potassium: 4.4 mmol/L (ref 3.5–5.2)
Sodium: 141 mmol/L (ref 134–144)
Total Protein: 6.3 g/dL (ref 6.0–8.5)
eGFR: 96 mL/min/{1.73_m2} (ref >59)

## 2020-12-27 LAB — LIPID PANEL
Chol/HDL Ratio: 2.6 ratio (ref 0.0–4.4)
Cholesterol, Total: 136 mg/dL (ref 100–199)
HDL: 52 mg/dL (ref >39)
LDL Chol Calc (NIH): 61 mg/dL (ref 0–99)
Triglycerides: 134 mg/dL (ref 0–149)
VLDL Cholesterol Cal: 23 mg/dL (ref 5–40)

## 2020-12-27 LAB — TSH: TSH: 1.46 u[IU]/mL (ref 0.450–4.500)

## 2021-01-05 ENCOUNTER — Other Ambulatory Visit: Payer: Self-pay

## 2021-01-05 ENCOUNTER — Encounter: Payer: Self-pay | Admitting: Family Medicine

## 2021-01-05 ENCOUNTER — Ambulatory Visit (INDEPENDENT_AMBULATORY_CARE_PROVIDER_SITE_OTHER): Payer: Medicare HMO | Admitting: Family Medicine

## 2021-01-05 VITALS — BP 124/57 | HR 72 | Ht 62.0 in | Wt 192.0 lb

## 2021-01-05 DIAGNOSIS — M545 Low back pain, unspecified: Secondary | ICD-10-CM | POA: Diagnosis not present

## 2021-01-05 MED ORDER — BACLOFEN 10 MG PO TABS: 10.0000 mg | ORAL_TABLET | Freq: Three times a day (TID) | ORAL | 0 refills | Status: DC

## 2021-01-05 MED ORDER — METHYLPREDNISOLONE ACETATE 40 MG/ML IJ SUSP
80.0000 mg | Freq: Once | INTRAMUSCULAR | Status: AC
  Administered 2021-01-05: 80 mg via INTRAMUSCULAR

## 2021-01-05 NOTE — Progress Notes (Signed)
BP (!) 124/57   Pulse 72   Ht 5\' 2"  (1.575 m)   Wt 192 lb (87.1 kg)   SpO2 96%   BMI 35.12 kg/m    Subjective:   Patient ID: Kimberly Donaldson, female    DOB: 1956-01-02, 65 y.o.   MRN: [id number]  HPI: Kimberly Donaldson is a 65 y.o. female presenting on 01/05/2021 for Back Pain (Lower back) and Hip Pain (right)   HPI Patient comes in today complaining of lower back pain that goes down into her hips on both sides.  It is worse on the right side than the left.  Its been hurting over the past 5 days but she cannot recall specific trauma or incident.  She says she did not twist or pull or fall but it is just been bothering her more.  Denies any numbness or weakness.  Denies any loss of bowel or bladder  Relevant past medical, surgical, family and social history reviewed and updated as indicated. Interim medical history since our last visit reviewed. Allergies and medications reviewed and updated.  Review of Systems  Constitutional:  Negative for chills and fever.  Eyes:  Negative for visual disturbance.  Respiratory:  Negative for chest tightness and shortness of breath.   Cardiovascular:  Negative for chest pain and leg swelling.  Genitourinary:  Negative for difficulty urinating and dysuria.  Musculoskeletal:  Positive for arthralgias, back pain and myalgias. Negative for gait problem.  Skin:  Negative for rash.  Neurological:  Negative for light-headedness and headaches.  Psychiatric/Behavioral:  Negative for agitation and behavioral problems.   All other systems reviewed and are negative.  Per HPI unless specifically indicated above   Allergies as of 01/05/2021       Reactions   Codeine    REACTION: ringing in ears   Morphine    REACTION: hives   Other    sporinof - for nail fungus        Medication List        Accurate as of January 05, 2021  2:43 PM. If you have any questions, ask your nurse or doctor.          AIRBORNE PO Take 1 tablet by mouth daily.    amLODipine 5 MG tablet Commonly known as: NORVASC Take 1 tablet (5 mg total) by mouth daily.   aspirin EC 81 MG tablet Take 81 mg by mouth daily. Swallow whole.   baclofen 10 MG tablet Commonly known as: LIORESAL Take 1 tablet (10 mg total) by mouth 3 (three) times daily. Started by: Fransisca Kaufmann Jorie Zee, MD   cyclobenzaprine 10 MG tablet Commonly known as: FLEXERIL TAKE 1 TABLET THREE TIMES DAILY AS NEEDED FOR MUSCLE SPASM   FISH OIL PO Take 1 tablet by mouth daily.   ibuprofen 200 MG tablet Commonly known as: ADVIL Take 800 mg by mouth every 6 (six) hours as needed for moderate pain.   lisinopril 20 MG tablet Commonly known as: ZESTRIL Take 2 tablets (40 mg total) by mouth daily.   loperamide 2 MG capsule Commonly known as: IMODIUM Take by mouth as needed for diarrhea or loose stools.   Ozempic (0.25 or 0.5 MG/DOSE) 2 MG/1.5ML Sopn Generic drug: Semaglutide(0.25 or 0.5MG /DOS) Inject 0.5 mg into the skin once a week.   rosuvastatin 5 MG tablet Commonly known as: Crestor Take 1 tablet (5 mg total) by mouth daily.   tobramycin 0.3 % ophthalmic solution Commonly known as: TOBREX 1 drop 4 (four) times  daily.         Objective:   BP (!) 124/57   Pulse 72   Ht 5\' 2"  (1.575 m)   Wt 192 lb (87.1 kg)   SpO2 96%   BMI 35.12 kg/m   Wt Readings from Last 3 Encounters:  01/05/21 192 lb (87.1 kg)  12/26/20 195 lb (88.5 kg)  11/01/20 193 lb (87.5 kg)    Physical Exam Vitals and nursing note reviewed.  Constitutional:      General: She is not in acute distress.    Appearance: She is well-developed. She is not diaphoretic.  Eyes:     Conjunctiva/sclera: Conjunctivae normal.  Musculoskeletal:     Lumbar back: Tenderness present. No deformity. Normal range of motion. Negative right straight leg raise test and negative left straight leg raise test. No scoliosis.  Skin:    General: Skin is warm and dry.     Findings: No rash.  Neurological:     Mental Status:  She is alert and oriented to person, place, and time.     Coordination: Coordination normal.  Psychiatric:        Behavior: Behavior normal.      Assessment & Plan:   Problem List Items Addressed This Visit   None Visit Diagnoses     Lumbar pain    -  Primary   Relevant Medications   methylPREDNISolone acetate (DEPO-MEDROL) injection 80 mg (Start on 01/05/2021  2:45 PM)   baclofen (LIORESAL) 10 MG tablet        Follow up plan: Return if symptoms worsen or fail to improve.  Counseling provided for all of the vaccine components No orders of the defined types were placed in this encounter.   Caryl Pina, MD [facility] 01/05/2021, 2:43 PM

## 2021-01-09 ENCOUNTER — Other Ambulatory Visit: Payer: Self-pay

## 2021-01-09 ENCOUNTER — Ambulatory Visit (INDEPENDENT_AMBULATORY_CARE_PROVIDER_SITE_OTHER): Payer: Medicare HMO | Admitting: Nurse Practitioner

## 2021-01-09 ENCOUNTER — Encounter: Payer: Self-pay | Admitting: Nurse Practitioner

## 2021-01-09 VITALS — BP 151/67 | HR 61 | Temp 97.4°F | Ht 62.0 in | Wt 193.0 lb

## 2021-01-09 DIAGNOSIS — M549 Dorsalgia, unspecified: Secondary | ICD-10-CM

## 2021-01-09 DIAGNOSIS — M5430 Sciatica, unspecified side: Secondary | ICD-10-CM

## 2021-01-09 MED ORDER — PREDNISONE 10 MG (21) PO TBPK: ORAL_TABLET | ORAL | 0 refills | Status: DC

## 2021-01-09 NOTE — Progress Notes (Signed)
Acute Office Visit  Subjective:    Patient ID: Kimberly Donaldson, female    DOB: May 28, 1955, 65 y.o.   MRN: [id number]  Chief Complaint  Patient presents with   Back Pain    Back Pain This is a new problem. The problem occurs intermittently. The problem has been gradually worsening since onset. The pain is present in the gluteal. The quality of the pain is described as aching. The pain is at a severity of 5/10. The pain is moderate. The pain is The same all the time. The symptoms are aggravated by bending (walking). Stiffness is present All day. Pertinent negatives include no bladder incontinence, bowel incontinence, chest pain or numbness. Risk factors include obesity. She has tried muscle relaxant for the symptoms.   Past Medical History:  Diagnosis Date   Diabetes mellitus without complication ([place])    Hypertension    Retina disorder, bilateral     Past Surgical History:  Procedure Laterality Date   CESAREAN SECTION     COLONOSCOPY  11/11/2007   Dr.Jacobs   FOOT SURGERY     WRIST SURGERY      Family History  Problem Relation Age of Onset   Cancer Mother    Liver cancer Mother    Colon polyps Father    Cancer Father    Diabetes Father    Bladder Cancer Father    Colon cancer Neg Hx    Esophageal cancer Neg Hx    Pancreatic cancer Neg Hx    Stomach cancer Neg Hx    Breast cancer Neg Hx     Social History   Socioeconomic History   Marital status: Widowed    Spouse name: Not on file   Number of children: Not on file   Years of education: Not on file   Highest education level: Not on file  Occupational History   Not on file  Tobacco Use   Smoking status: Every Day    Packs/day: 1.00    Types: Cigarettes   Smokeless tobacco: Never  Vaping Use   Vaping Use: Never used  Substance and Sexual Activity   Alcohol use: Yes    Comment: occ   Drug use: Yes    Types: Marijuana    Comment: gummies every 2-3 months   Sexual activity: Not Currently    Birth  control/protection: Post-menopausal  Other Topics Concern   Not on file  Social History Narrative   Not on file   Social Determinants of Health   Financial Resource Strain: Low Risk    Difficulty of Paying Living Expenses: Not very hard  Food Insecurity: No Food Insecurity   Worried About Charity fundraiser in the Last Year: Never true   Ran Out of Food in the Last Year: Never true  Transportation Needs: No Transportation Needs   Lack of Transportation (Medical): No   Lack of Transportation (Non-Medical): No  Physical Activity: Inactive   Days of Exercise per Week: 0 days   Minutes of Exercise per Session: 0 min  Stress: No Stress Concern Present   Feeling of Stress : Only a little  Social Connections: Moderately Integrated   Frequency of Communication with Friends and Family: More than three times a week   Frequency of Social Gatherings with Friends and Family: Once a week   Attends Religious Services: More than 4 times per year   Active Member of [employer] or Organizations: Yes   Attends Archivist Meetings: More than 4 times  per year   Marital Status: Widowed  Human resources officer Violence: Not At Risk   Fear of Current or Ex-Partner: No   Emotionally Abused: No   Physically Abused: No   Sexually Abused: No    Outpatient Medications Prior to Visit  Medication Sig Dispense Refill   amLODipine (NORVASC) 5 MG tablet Take 1 tablet (5 mg total) by mouth daily. 90 tablet 3   aspirin EC 81 MG tablet Take 81 mg by mouth daily. Swallow whole.     baclofen (LIORESAL) 10 MG tablet Take 1 tablet (10 mg total) by mouth 3 (three) times daily. 30 each 0   ibuprofen (ADVIL,MOTRIN) 200 MG tablet Take 800 mg by mouth every 6 (six) hours as needed for moderate pain.     lisinopril (ZESTRIL) 20 MG tablet Take 2 tablets (40 mg total) by mouth daily. 180 tablet 3   loperamide (IMODIUM) 2 MG capsule Take by mouth as needed for diarrhea or loose stools.     Multiple Vitamins-Minerals  (AIRBORNE PO) Take 1 tablet by mouth daily.     Omega-3 Fatty Acids (FISH OIL PO) Take 1 tablet by mouth daily.     rosuvastatin (CRESTOR) 5 MG tablet Take 1 tablet (5 mg total) by mouth daily. 90 tablet 3   Semaglutide,0.25 or 0.5MG/DOS, (OZEMPIC, 0.25 OR 0.5 MG/DOSE,) 2 MG/1.5ML SOPN Inject 0.5 mg into the skin once a week. 4.5 mL 3   tobramycin (TOBREX) 0.3 % ophthalmic solution 1 drop 4 (four) times daily.     cyclobenzaprine (FLEXERIL) 10 MG tablet TAKE 1 TABLET THREE TIMES DAILY AS NEEDED FOR MUSCLE SPASM 60 tablet 0   No facility-administered medications prior to visit.    Allergies  Allergen Reactions   Codeine     REACTION: ringing in ears   Morphine     REACTION: hives   Other     sporinof - for nail fungus    Review of Systems  Constitutional: Negative.   HENT: Negative.  Negative for congestion.   Cardiovascular:  Negative for chest pain.  Gastrointestinal:  Negative for bowel incontinence.  Genitourinary:  Negative for bladder incontinence.  Musculoskeletal:  Positive for back pain.  Skin:  Negative for rash.  Neurological:  Negative for numbness.  All other systems reviewed and are negative.     Objective:    Physical Exam Vitals and nursing note reviewed.  Constitutional:      Appearance: Normal appearance.  HENT:     Head: Normocephalic.     Nose: Nose normal.     Mouth/Throat:     Mouth: Mucous membranes are moist.     Pharynx: Oropharynx is clear.  Eyes:     Conjunctiva/sclera: Conjunctivae normal.  Cardiovascular:     Rate and Rhythm: Normal rate and regular rhythm.     Pulses: Normal pulses.     Heart sounds: Normal heart sounds.  Pulmonary:     Effort: Pulmonary effort is normal.     Breath sounds: Normal breath sounds.  Abdominal:     General: Bowel sounds are normal.  Musculoskeletal:     Lumbar back: Tenderness present.  Skin:    General: Skin is warm.     Findings: No rash.  Neurological:     Mental Status: She is alert.   Psychiatric:        Behavior: Behavior normal.    BP (!) 151/67   Pulse 61   Temp (!) 97.4 F (36.3 C) (Temporal)   Ht 5' 2"  (  1.575 m)   Wt 193 lb (87.5 kg)   BMI 35.30 kg/m  Wt Readings from Last 3 Encounters:  01/09/21 193 lb (87.5 kg)  01/05/21 192 lb (87.1 kg)  12/26/20 195 lb (88.5 kg)    Health Maintenance Due  Topic Date Due   Pneumonia Vaccine 10+ Years old (1 - PCV) Never done    There are no preventive care reminders to display for this patient.   Lab Results  Component Value Date   TSH 1.460 12/26/2020   Lab Results  Component Value Date   WBC 8.8 12/26/2020   HGB 14.7 12/26/2020   HCT 44.3 12/26/2020   MCV 88 12/26/2020   PLT 231 12/26/2020   Lab Results  Component Value Date   NA 141 12/26/2020   K 4.4 12/26/2020   CO2 22 12/26/2020   GLUCOSE 144 (H) 12/26/2020   BUN 11 12/26/2020   CREATININE 0.70 12/26/2020   BILITOT 0.4 12/26/2020   ALKPHOS 86 12/26/2020   AST 16 12/26/2020   ALT 13 12/26/2020   PROT 6.3 12/26/2020   ALBUMIN 4.1 12/26/2020   CALCIUM 8.9 12/26/2020   ANIONGAP 9 05/19/2014   EGFR 96 12/26/2020   GFR 79.88 08/05/2020   Lab Results  Component Value Date   CHOL 136 12/26/2020   Lab Results  Component Value Date   HDL 52 12/26/2020   Lab Results  Component Value Date   LDLCALC 61 12/26/2020   Lab Results  Component Value Date   TRIG 134 12/26/2020   Lab Results  Component Value Date   CHOLHDL 2.6 12/26/2020   Lab Results  Component Value Date   HGBA1C 5.8 (H) 12/26/2020       Assessment & Plan:   Problem List Items Addressed This Visit       Nervous and Auditory   Back pain with sciatica - Primary    Back pain with sciatica not well controlled. Continue baclofen, heating pad, started patient on a prednisone taper. Education provided,  Patient verbalize understanding and knows to follow up with worsening unresolved symptoms.  RX sent to pharmacy.      Relevant Medications   predniSONE  (STERAPRED UNI-PAK 21 TAB) 10 MG (21) TBPK tablet     Meds ordered this encounter  Medications   predniSONE (STERAPRED UNI-PAK 21 TAB) 10 MG (21) TBPK tablet    Sig: 6 tablet day 1, 5 tab day 2, 4 tab day 3, 3 tab day 4, 2 tab day 5, 1 tab day 6    Dispense:  1 each    Refill:  0    Order Specific Question:   Supervising Provider    AnswerJeneen Rinks      Ivy Lynn, NP

## 2021-01-09 NOTE — Patient Instructions (Signed)

## 2021-01-09 NOTE — Assessment & Plan Note (Signed)
Back pain with sciatica not well controlled. Continue baclofen, heating pad, started patient on a prednisone taper. Education provided,  Patient verbalize understanding and knows to follow up with worsening unresolved symptoms.  RX sent to pharmacy.

## 2021-01-27 DIAGNOSIS — H35033 Hypertensive retinopathy, bilateral: Secondary | ICD-10-CM | POA: Diagnosis not present

## 2021-01-27 DIAGNOSIS — E113411 Type 2 diabetes mellitus with severe nonproliferative diabetic retinopathy with macular edema, right eye: Secondary | ICD-10-CM | POA: Diagnosis not present

## 2021-01-27 DIAGNOSIS — H43813 Vitreous degeneration, bilateral: Secondary | ICD-10-CM | POA: Diagnosis not present

## 2021-01-27 DIAGNOSIS — E113312 Type 2 diabetes mellitus with moderate nonproliferative diabetic retinopathy with macular edema, left eye: Secondary | ICD-10-CM | POA: Diagnosis not present

## 2021-03-21 ENCOUNTER — Encounter: Payer: Medicare Other | Admitting: Family Medicine

## 2021-03-22 ENCOUNTER — Encounter: Payer: Medicare HMO | Admitting: Family Medicine

## 2021-03-24 DIAGNOSIS — E113312 Type 2 diabetes mellitus with moderate nonproliferative diabetic retinopathy with macular edema, left eye: Secondary | ICD-10-CM | POA: Diagnosis not present

## 2021-03-24 DIAGNOSIS — H35033 Hypertensive retinopathy, bilateral: Secondary | ICD-10-CM | POA: Diagnosis not present

## 2021-03-24 DIAGNOSIS — E113411 Type 2 diabetes mellitus with severe nonproliferative diabetic retinopathy with macular edema, right eye: Secondary | ICD-10-CM | POA: Diagnosis not present

## 2021-03-24 DIAGNOSIS — H43813 Vitreous degeneration, bilateral: Secondary | ICD-10-CM | POA: Diagnosis not present

## 2021-03-31 DIAGNOSIS — E785 Hyperlipidemia, unspecified: Secondary | ICD-10-CM | POA: Diagnosis not present

## 2021-03-31 DIAGNOSIS — M810 Age-related osteoporosis without current pathological fracture: Secondary | ICD-10-CM | POA: Diagnosis not present

## 2021-03-31 DIAGNOSIS — Z6831 Body mass index (BMI) 31.0-31.9, adult: Secondary | ICD-10-CM | POA: Diagnosis not present

## 2021-03-31 DIAGNOSIS — Z008 Encounter for other general examination: Secondary | ICD-10-CM | POA: Diagnosis not present

## 2021-03-31 DIAGNOSIS — G8929 Other chronic pain: Secondary | ICD-10-CM | POA: Diagnosis not present

## 2021-03-31 DIAGNOSIS — I10 Essential (primary) hypertension: Secondary | ICD-10-CM | POA: Diagnosis not present

## 2021-03-31 DIAGNOSIS — E1151 Type 2 diabetes mellitus with diabetic peripheral angiopathy without gangrene: Secondary | ICD-10-CM | POA: Diagnosis not present

## 2021-03-31 DIAGNOSIS — E669 Obesity, unspecified: Secondary | ICD-10-CM | POA: Diagnosis not present

## 2021-03-31 DIAGNOSIS — Z809 Family history of malignant neoplasm, unspecified: Secondary | ICD-10-CM | POA: Diagnosis not present

## 2021-03-31 DIAGNOSIS — Z7985 Long-term (current) use of injectable non-insulin antidiabetic drugs: Secondary | ICD-10-CM | POA: Diagnosis not present

## 2021-03-31 DIAGNOSIS — R32 Unspecified urinary incontinence: Secondary | ICD-10-CM | POA: Diagnosis not present

## 2021-03-31 DIAGNOSIS — Z720 Tobacco use: Secondary | ICD-10-CM | POA: Diagnosis not present

## 2021-03-31 DIAGNOSIS — Z7982 Long term (current) use of aspirin: Secondary | ICD-10-CM | POA: Diagnosis not present

## 2021-04-05 ENCOUNTER — Encounter: Payer: Self-pay | Admitting: Family Medicine

## 2021-04-05 ENCOUNTER — Ambulatory Visit (INDEPENDENT_AMBULATORY_CARE_PROVIDER_SITE_OTHER): Payer: Medicare HMO | Admitting: Family Medicine

## 2021-04-05 VITALS — BP 135/60 | HR 68 | Temp 97.8°F | Ht 62.0 in | Wt 191.0 lb

## 2021-04-05 DIAGNOSIS — Z20828 Contact with and (suspected) exposure to other viral communicable diseases: Secondary | ICD-10-CM | POA: Diagnosis not present

## 2021-04-05 NOTE — Patient Instructions (Signed)
Exam negative for signs of illness. Continue normal activities. Test results usually take overnight.

## 2021-04-05 NOTE — Progress Notes (Signed)
Subjective:  Patient ID: Kimberly Donaldson, female    DOB: 05-02-1955  Age: 66 y.o. MRN: [id number]  CC: INFLUENZA EXPOSURE   HPI Kimberly Donaldson presents for exposure to influenza.  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  Currently she is free of any symptoms.  However, she is concerned for both her health and that of her grandchildren and would like to be tested.  Depression screen [facility] 2/9 04/05/2021 01/09/2021 12/26/2020  Decreased Interest 0 0 0  Down, Depressed, Hopeless 0 0 0  PHQ - 2 Score 0 0 0  Altered sleeping - - 1  Tired, decreased energy - - 1  Change in appetite - - -  Feeling bad or failure about yourself  - - 0  Trouble concentrating - - 0  Moving slowly or fidgety/restless - - 0  Suicidal thoughts - - 0  PHQ-9 Score - - -  Some recent data might be hidden    History Kimberly Donaldson has a past medical history of Diabetes mellitus without complication ([place]), Hypertension, and Retina disorder, bilateral.   She has a past surgical history that includes Cesarean section; Foot surgery; Wrist surgery; and Colonoscopy (11/11/2007).   Her family history includes Bladder Cancer in her father; Cancer in her father and mother; Colon polyps in her father; Diabetes in her father; Liver cancer in her mother.She reports that she has been smoking cigarettes. She has been smoking an average of 1 pack per day. She has never used smokeless tobacco. She reports current alcohol use. She reports current drug use. Drug: Marijuana.    ROS Review of Systems  Constitutional:  Negative for appetite change, chills, diaphoresis, fatigue and fever.  HENT:  Negative for congestion, ear pain, hearing loss, postnasal drip, rhinorrhea, sore throat and trouble swallowing.   Respiratory:  Negative for cough, chest tightness and shortness of breath.   Cardiovascular:  Negative for chest pain and palpitations.  Gastrointestinal:  Negative for abdominal  pain.  Musculoskeletal:  Negative for arthralgias.  Skin:  Negative for rash.   Objective:  BP 135/60    Pulse 68    Temp 97.8 F (36.6 C)    Ht 5\' 2"  (1.575 m)    Wt 191 lb (86.6 kg)    SpO2 98%    BMI 34.93 kg/m   BP Readings from Last 3 Encounters:  04/05/21 135/60  01/09/21 (!) 151/67  01/05/21 (!) 124/57    Wt Readings from Last 3 Encounters:  04/05/21 191 lb (86.6 kg)  01/09/21 193 lb (87.5 kg)  01/05/21 192 lb (87.1 kg)     Physical Exam Constitutional:      Appearance: Normal appearance.  Cardiovascular:     Rate and Rhythm: Normal rate and regular rhythm.  Pulmonary:     Breath sounds: Normal breath sounds.  Skin:    General: Skin is warm.  Neurological:     General: No focal deficit present.     Mental Status: She is alert and oriented to person, place, and time.  Psychiatric:        Mood and Affect: Mood normal.        Behavior: Behavior normal.      Assessment & Plan:   Kimberly Donaldson was seen today for influenza exposure.  Diagnoses and all orders for this visit:  Exposure to the flu -     COVID-19, Flu A+B and RSV       I am  having Kimberly Donaldson "[name]" maintain her ibuprofen, aspirin EC, Omega-3 Fatty Acids (FISH OIL PO), loperamide, Multiple Vitamins-Minerals (AIRBORNE PO), tobramycin, lisinopril, Ozempic (0.25 or 0.5 MG/DOSE), amLODipine, rosuvastatin, baclofen, and predniSONE.  Allergies as of 04/05/2021       Reactions   Codeine    REACTION: ringing in ears   Morphine    REACTION: hives   Other    sporinof - for nail fungus        Medication List        Accurate as of April 05, 2021  6:27 PM. If you have any questions, ask your nurse or doctor.          AIRBORNE PO Take 1 tablet by mouth daily.   amLODipine 5 MG tablet Commonly known as: NORVASC Take 1 tablet (5 mg total) by mouth daily.   aspirin EC 81 MG tablet Take 81 mg by mouth daily. Swallow whole.   baclofen 10 MG tablet Commonly known as:  LIORESAL Take 1 tablet (10 mg total) by mouth 3 (three) times daily.   FISH OIL PO Take 1 tablet by mouth daily.   ibuprofen 200 MG tablet Commonly known as: ADVIL Take 800 mg by mouth every 6 (six) hours as needed for moderate pain.   lisinopril 20 MG tablet Commonly known as: ZESTRIL Take 2 tablets (40 mg total) by mouth daily.   loperamide 2 MG capsule Commonly known as: IMODIUM Take by mouth as needed for diarrhea or loose stools.   Ozempic (0.25 or 0.5 MG/DOSE) 2 MG/1.5ML Sopn Generic drug: Semaglutide(0.25 or 0.5MG /DOS) Inject 0.5 mg into the skin once a week.   predniSONE 10 MG (21) Tbpk tablet Commonly known as: STERAPRED UNI-PAK 21 TAB 6 tablet day 1, 5 tab day 2, 4 tab day 3, 3 tab day 4, 2 tab day 5, 1 tab day 6   rosuvastatin 5 MG tablet Commonly known as: Crestor Take 1 tablet (5 mg total) by mouth daily.   tobramycin 0.3 % ophthalmic solution Commonly known as: TOBREX 1 drop 4 (four) times daily.         Follow-up: Return if symptoms worsen or fail to improve.  Claretta Fraise, M.D.

## 2021-04-06 LAB — COVID-19, FLU A+B AND RSV
Influenza A, NAA: NOT DETECTED
Influenza B, NAA: NOT DETECTED
RSV, NAA: NOT DETECTED
SARS-CoV-2, NAA: NOT DETECTED

## 2021-05-05 ENCOUNTER — Ambulatory Visit (INDEPENDENT_AMBULATORY_CARE_PROVIDER_SITE_OTHER): Payer: Medicare HMO | Admitting: Family Medicine

## 2021-05-05 ENCOUNTER — Encounter: Payer: Self-pay | Admitting: Family Medicine

## 2021-05-05 VITALS — BP 120/67 | HR 67 | Ht 62.0 in | Wt 187.0 lb

## 2021-05-05 DIAGNOSIS — E781 Pure hyperglyceridemia: Secondary | ICD-10-CM

## 2021-05-05 DIAGNOSIS — I739 Peripheral vascular disease, unspecified: Secondary | ICD-10-CM

## 2021-05-05 DIAGNOSIS — M546 Pain in thoracic spine: Secondary | ICD-10-CM

## 2021-05-05 DIAGNOSIS — Z23 Encounter for immunization: Secondary | ICD-10-CM | POA: Diagnosis not present

## 2021-05-05 DIAGNOSIS — E1169 Type 2 diabetes mellitus with other specified complication: Secondary | ICD-10-CM

## 2021-05-05 DIAGNOSIS — I10 Essential (primary) hypertension: Secondary | ICD-10-CM | POA: Diagnosis not present

## 2021-05-05 LAB — BAYER DCA HB A1C WAIVED: HB A1C (BAYER DCA - WAIVED): 5.4 % (ref 4.8–5.6)

## 2021-05-05 NOTE — Progress Notes (Signed)
BP 120/67    Pulse 67    Ht 5\' 2"  (1.575 m)    Wt 187 lb (84.8 kg)    SpO2 97%    BMI 34.20 kg/m    Subjective:   Patient ID: Kimberly Donaldson, female    DOB: [birth year], 66 y.o.   MRN: [id number]  HPI: Kimberly Donaldson is a 66 y.o. female presenting on 05/05/2021 for Medical Management of Chronic Issues and Diabetes   HPI Type 2 diabetes mellitus Patient comes in today for recheck of his diabetes. Patient has been currently taking Ozempic. Patient is currently on an ACE inhibitor/ARB. Patient has not seen an ophthalmologist this year. Patient denies any issues with their feet. The symptom started onset as an adult hypertension and hyperlipidemia ARE RELATED TO DM   Hypertension Patient is currently on amlodipine and lisinopril, and their blood pressure today is 120/67. Patient denies any lightheadedness or dizziness. Patient denies headaches, blurred vision, chest pains, shortness of breath, or weakness. Denies any side effects from medication and is content with current medication.   Hyperlipidemia Patient is coming in for recheck of his hyperlipidemia. The patient is currently taking fish oil and Crestor. They deny any issues with myalgias or history of liver damage from it. They deny any focal numbness or weakness or chest pain.   Patient has been having continued intermittent sporadic back across her shoulder blades.  She says will come and go but definitely rest and heat helps relieve it.  Baclofen does not help as much.  PAD Patient is coming in for PAD.  She had an insurance exam that showed that she had ABIs showing a PAD of score of 0.3 on the left side and a normal score of 0.91 on the right side.  She does admit that she gets pains in her calf when she walks which sounds like claudication.  She is still smoking.  Relevant past medical, surgical, family and social history reviewed and updated as indicated. Interim medical history since our last visit reviewed. Allergies and  medications reviewed and updated.  Review of Systems  Constitutional:  Negative for chills and fever.  Eyes:  Negative for visual disturbance.  Respiratory:  Negative for chest tightness and shortness of breath.   Cardiovascular:  Negative for chest pain and leg swelling.  Musculoskeletal:  Positive for arthralgias and myalgias. Negative for back pain and gait problem.  Skin:  Negative for rash.  Neurological:  Negative for light-headedness and headaches.  Psychiatric/Behavioral:  Negative for agitation and behavioral problems.   All other systems reviewed and are negative.  Per HPI unless specifically indicated above   Allergies as of 05/05/2021       Reactions   Codeine    REACTION: ringing in ears   Morphine    REACTION: hives   Other    sporinof - for nail fungus        Medication List        Accurate as of May 05, 2021 11:59 AM. If you have any questions, ask your nurse or doctor.          STOP taking these medications    predniSONE 10 MG (21) Tbpk tablet Commonly known as: STERAPRED UNI-PAK 21 TAB Stopped by: Fransisca Kaufmann Dasja Brase, MD       TAKE these medications    AIRBORNE PO Take 1 tablet by mouth daily.   amLODipine 5 MG tablet Commonly known as: NORVASC Take 1 tablet (5 mg total) by  mouth daily.   aspirin EC 81 MG tablet Take 81 mg by mouth daily. Swallow whole.   baclofen 10 MG tablet Commonly known as: LIORESAL Take 1 tablet (10 mg total) by mouth 3 (three) times daily.   FISH OIL PO Take 1 tablet by mouth daily.   ibuprofen 200 MG tablet Commonly known as: ADVIL Take 800 mg by mouth every 6 (six) hours as needed for moderate pain.   lisinopril 20 MG tablet Commonly known as: ZESTRIL Take 2 tablets (40 mg total) by mouth daily.   loperamide 2 MG capsule Commonly known as: IMODIUM Take by mouth as needed for diarrhea or loose stools.   Ozempic (0.25 or 0.5 MG/DOSE) 2 MG/1.5ML Sopn Generic drug: Semaglutide(0.25 or  0.5MG /DOS) Inject 0.5 mg into the skin once a week.   rosuvastatin 5 MG tablet Commonly known as: Crestor Take 1 tablet (5 mg total) by mouth daily.   tobramycin 0.3 % ophthalmic solution Commonly known as: TOBREX 1 drop 4 (four) times daily.         Objective:   BP 120/67    Pulse 67    Ht 5\' 2"  (1.575 m)    Wt 187 lb (84.8 kg)    SpO2 97%    BMI 34.20 kg/m   Wt Readings from Last 3 Encounters:  05/05/21 187 lb (84.8 kg)  04/05/21 191 lb (86.6 kg)  01/09/21 193 lb (87.5 kg)    Physical Exam Vitals and nursing note reviewed.  Constitutional:      General: She is not in acute distress.    Appearance: She is well-developed. She is not diaphoretic.  Eyes:     Conjunctiva/sclera: Conjunctivae normal.     Pupils: Pupils are equal, round, and reactive to light.  Cardiovascular:     Rate and Rhythm: Normal rate and regular rhythm.     Pulses:          Dorsalis pedis pulses are 2+ on the right side and 1+ on the left side.     Heart sounds: Normal heart sounds. No murmur heard. Pulmonary:     Effort: Pulmonary effort is normal. No respiratory distress.     Breath sounds: Normal breath sounds. No wheezing.  Musculoskeletal:        General: No tenderness. Normal range of motion.  Skin:    General: Skin is warm and dry.     Findings: No rash.  Neurological:     Mental Status: She is alert and oriented to person, place, and time.     Coordination: Coordination normal.  Psychiatric:        Behavior: Behavior normal.    Assessment & Plan:   Problem List Items Addressed This Visit       Cardiovascular and Mediastinum   HTN (hypertension)     Endocrine   DM2 (diabetes mellitus, type 2) ([place]) - Primary   Relevant Orders   Bayer DCA Hb A1c Waived   Type 2 diabetes mellitus with hypertriglyceridemia ([place])   Other Visit Diagnoses     Need for shingles vaccine       Relevant Orders   Varicella-zoster vaccine IM (Shingrix) (Completed)   PAD (peripheral artery  disease) ([place])       Relevant Orders   Ambulatory referral to Vascular Surgery   Acute bilateral thoracic back pain       Relevant Orders   Ambulatory referral to Physical Therapy       We will refer to vascular surgery for  her peripheral arterial disease and diminished pulses in the left leg.  She also has claudication.  We will refer to physical therapy for her upper back, it does seem muscular in origin and it comes and goes.  Feel like they can teach her some exercises and stretches and maneuvers to help with this.  A1c was good at 5.4 blood pressure looks good, no change in medication. Follow up plan: Return in about 3 months (around 08/02/2021), or if symptoms worsen or fail to improve, for htn and hld.  Counseling provided for all of the vaccine components Orders Placed This Encounter  Procedures   Varicella-zoster vaccine IM (Shingrix)   Bayer DCA Hb A1c Waived   Ambulatory referral to Vascular Surgery   Ambulatory referral to Physical Therapy    Caryl Pina, MD [facility] 05/05/2021, 11:59 AM

## 2021-05-12 ENCOUNTER — Telehealth: Payer: Self-pay | Admitting: Family Medicine

## 2021-05-12 NOTE — Telephone Encounter (Signed)
REFERRAL REQUEST Telephone Note  Have you been seen at our office for this problem? yes (Advise that they may need an appointment with their PCP before a referral can be done)  Reason for Referral: veins Referral discussed with patient: yes  Best contact number of patient for referral team: [phone]    Has patient been seen by a specialist for this issue before: not sure  Patient provider preference for referral: none Patient location preference for referral: none   Patient notified that referrals can take up to a week or longer to process. If they haven't heard anything within a week they should call back and speak with the referral department.    Dettinger's pt.

## 2021-05-19 DIAGNOSIS — H43813 Vitreous degeneration, bilateral: Secondary | ICD-10-CM | POA: Diagnosis not present

## 2021-05-19 DIAGNOSIS — H35033 Hypertensive retinopathy, bilateral: Secondary | ICD-10-CM | POA: Diagnosis not present

## 2021-05-19 DIAGNOSIS — E113411 Type 2 diabetes mellitus with severe nonproliferative diabetic retinopathy with macular edema, right eye: Secondary | ICD-10-CM | POA: Diagnosis not present

## 2021-05-19 DIAGNOSIS — E113312 Type 2 diabetes mellitus with moderate nonproliferative diabetic retinopathy with macular edema, left eye: Secondary | ICD-10-CM | POA: Diagnosis not present

## 2021-05-23 ENCOUNTER — Other Ambulatory Visit: Payer: Self-pay

## 2021-05-23 ENCOUNTER — Ambulatory Visit: Payer: Medicare HMO | Attending: Family Medicine

## 2021-05-23 DIAGNOSIS — M546 Pain in thoracic spine: Secondary | ICD-10-CM | POA: Diagnosis not present

## 2021-05-23 NOTE — Therapy (Signed)
[facility] [place], [place], [address] Phone: [phone]   Fax:  [phone]  Physical Therapy Evaluation  Patient Details  Name: Kimberly Donaldson MRN: [id number] Date of Birth: 1955-12-15 Referring Provider (PT): Dettinger, MD   Encounter Date: 05/23/2021   PT End of Session - 05/23/21 1510     Visit Number 1    Number of Visits 12    Date for PT Re-Evaluation 07/21/21    PT Start Time 6962    PT Stop Time 1611    PT Time Calculation (min) 54 min    Activity Tolerance Patient tolerated treatment well    Behavior During Therapy [facility] for tasks assessed/performed             Past Medical History:  Diagnosis Date   Diabetes mellitus without complication ([place])    Hypertension    Retina disorder, bilateral     Past Surgical History:  Procedure Laterality Date   CESAREAN SECTION     COLONOSCOPY  11/11/2007   Dr.Jacobs   FOOT SURGERY     WRIST SURGERY      There were no vitals filed for this visit.    Subjective Assessment - 05/23/21 1519     Subjective Patient reports that her back started hurting about 3 weeks ago. She does not know of anything that caused her back to begin hurting, She notes that her back has been bothering her for years. She notes that the pain has been so bad that it brought her to tears. She notes that her pain has been progressively getting worse since it started.    Pertinent History chronic back pain    Limitations Walking;Standing;Sitting;House hold activities    How long can you stand comfortably? within 30 minutes    Patient Stated Goals reduced pain, play with her grandchildren, clean her house, garden    Currently in Pain? Yes    Pain Score 10-Worst pain ever    Pain Location Back    Pain Orientation Mid;Left    Pain Descriptors / Indicators Aching;Stabbing    Pain Type Acute pain    Pain Radiating Towards around left scapula    Pain Onset 1 to 4 weeks ago    Pain Frequency  Intermittent    Aggravating Factors  unsure    Pain Relieving Factors heat, laying flat    Effect of Pain on Daily Activities she is unable to clean, wash dishes, and limits her ability to care for her grandchildren.                [place] PT Assessment - 05/23/21 0001       Assessment   Medical Diagnosis Acute bilateral thoracic back pain    Referring Provider (PT) Dettinger, MD    Onset Date/Surgical Date --   3 weeks ago   Next MD Visit None scheduled    Prior Therapy No      Precautions   Precautions None      Restrictions   Weight Bearing Restrictions No      Balance Screen   Has the patient fallen in the past 6 months No    Has the patient had a decrease in activity level because of a fear of falling?  No    Is the patient reluctant to leave their home because of a fear of falling?  No      Home Ecologist residence    Living Arrangements Other  relatives   grandchildren     Prior Function   Level of Independence Independent    Vocation Retired    Leisure playing with her grandchildren, gardening      Cognition   Overall Cognitive Status Within Functional Limits for tasks assessed    Attention Focused    Focused Attention Appears intact    Memory Appears intact    Awareness Appears intact    Problem Solving Appears intact      Observation/Other Assessments   Focus on Therapeutic Outcomes (FOTO)  40.29      Sensation   Additional Comments Patient reports no numbness or tingling      ROM / Strength   AROM / PROM / Strength Strength;AROM      AROM   AROM Assessment Site Thoracic    Thoracic Flexion 80    Thoracic Extension 10    Thoracic - Right Side Bend 25% limited    Thoracic - Left Side Bend 25% limited    Thoracic - Right Rotation 25% limited    Thoracic - Left Rotation 25% limited      Strength   Strength Assessment Site Shoulder;Elbow    Right/Left Shoulder Left;Right    Right Shoulder Flexion 4-/5    Right  Shoulder ABduction 4-/5    Left Shoulder Flexion 3+/5    Left Shoulder ABduction 3+/5    Right/Left Elbow Right;Left    Right Elbow Flexion 4-/5    Right Elbow Extension 4-/5    Left Elbow Flexion 4-/5    Left Elbow Extension 4-/5      Palpation   Spinal mobility Thoracic spine: hypomobile throughout with T3-4 and T6-7 being painful    Palpation comment TTP: thoracic paraspinals, periscapular musculature, infraspinatus                        Objective measurements completed on examination: See above findings.       [facility] Adult PT Treatment/Exercise - 05/23/21 0001       Modalities   Modalities Electrical Stimulation      Electrical Stimulation   Electrical Stimulation Location Left thoracic paraspinals and infraspinatus    Electrical Stimulation Action IFC    Electrical Stimulation Parameters 80-150 Hz w/ 40% scan x 15 minutes    Electrical Stimulation Goals Pain;Tone      Manual Therapy   Manual Therapy Joint mobilization;Soft tissue mobilization    Joint Mobilization Grade I-II thoracic CPA's    Soft tissue mobilization left thoracic paraspinals and periscapular musculature                          PT Long Term Goals - 05/23/21 1744       PT LONG TERM GOAL #1   Title Patient will be independent with her HEP.    Time 4    Period Weeks    Status New    Target Date 06/20/21      PT LONG TERM GOAL #2   Title Patient will be able to play with her grandchildren without her familiar thoracic pain exceeding a 7/10.    Time 4    Period Weeks    Status New    Target Date 06/20/21      PT LONG TERM GOAL #3   Title Patient will be able to stand for at least 45 minutes without being limited by her familiar thoracic pain.    Time 4  Period Weeks    Status New    Target Date 06/20/21                    Plan - 05/23/21 1618     Clinical Impression Statement Patient is a 66 year old female presenting to physical therapy  with acute left sided thoracic pain with no known cause. She presented moderate pain severity and irritability with her pain being reproduced by thoracic joint mobility assessement and palpation to the surrounding musculature. However, her familiar symptoms were able to be significantly reduced with soft tissue mobilization to the area followed by electrical stimulation. She reported feeling quite a bit better upon the conclusion of treatment. Recommend that she continue with skilled physical therapy to address her remaining impairments to return to her prior level of function.    Personal Factors and Comorbidities Other    Examination-Activity Limitations Carry;Lift;Stand;Sit;Caring for Others    Examination-Participation Restrictions Driving;Other;Cleaning    Stability/Clinical Decision Making Evolving/Moderate complexity    Clinical Decision Making Moderate    Rehab Potential Good    PT Frequency 3x / week   2-3x / week   PT Duration 4 weeks    PT Treatment/Interventions Cryotherapy;Electrical Stimulation;Moist Heat;Functional mobility training;Therapeutic activities;Therapeutic exercise;Neuromuscular re-education;Manual techniques;Patient/family education;Dry needling;Taping    PT Next Visit Plan UBE, scapular stabilization, STM to thoracic musculature, and modalities as needed    Consulted and Agree with Plan of Care Patient             Patient will benefit from skilled therapeutic intervention in order to improve the following deficits and impairments:  Decreased range of motion, Difficulty walking, Decreased activity tolerance, Pain, Hypomobility, Decreased strength, Decreased mobility  Visit Diagnosis: Acute bilateral thoracic back pain     Problem List Patient Active Problem List   Diagnosis Date Noted   Type 2 diabetes mellitus with hypertriglyceridemia ([place]) 07/11/2020   Wound of right leg 12/31/2019   Stress incontinence 06/23/2015   HTN (hypertension) 05/20/2015   Back  pain with sciatica 02/03/2015   DM2 (diabetes mellitus, type 2) ([place]) 02/03/2015    Darlin Coco, PT 05/23/2021, 5:46 PM  [facility] [address] [place], [place], [address] Phone: [phone]   Fax:  [phone]  Name: Kimberly Donaldson MRN: [id number] Date of Birth: 19-Feb-1956

## 2021-05-30 ENCOUNTER — Ambulatory Visit: Payer: Medicare HMO

## 2021-05-30 ENCOUNTER — Other Ambulatory Visit: Payer: Self-pay

## 2021-05-30 DIAGNOSIS — M546 Pain in thoracic spine: Secondary | ICD-10-CM

## 2021-05-30 NOTE — Therapy (Signed)
[facility] ?[place] ?[place], [place], [address] ?Phone: [phone]   Fax:  [phone] ? ?Physical Therapy Treatment ? ?Patient Details  ?Name: Kimberly Donaldson ?MRN: [id number] ?Date of Birth: Nov 15, 1955 ?Referring Provider (PT): Dettinger, MD ? ? ?Encounter Date: 05/30/2021 ? ? PT End of Session - 05/30/21 1645   ? ? Visit Number 2   ? Number of Visits 12   ? Date for PT Re-Evaluation 07/21/21   ? PT Start Time 8115   ? PT Stop Time 7262   ? PT Time Calculation (min) 52 min   ? Activity Tolerance Patient tolerated treatment well   ? Behavior During Therapy [facility] for tasks assessed/performed   ? ?  ?  ? ?  ? ? ?Past Medical History:  ?Diagnosis Date  ? Diabetes mellitus without complication ([place])   ? Hypertension   ? Retina disorder, bilateral   ? ? ?Past Surgical History:  ?Procedure Laterality Date  ? CESAREAN SECTION    ? COLONOSCOPY  11/11/2007  ? Dr.Jacobs  ? FOOT SURGERY    ? WRIST SURGERY    ? ? ?There were no vitals filed for this visit. ? ? Subjective Assessment - 05/30/21 1643   ? ? Subjective Patient reports that her back feels better than it did at her last appointment. She notes that most her pain is right around her shoulder blades now.   ? Pertinent History chronic back pain   ? Limitations Walking;Standing;Sitting;House hold activities   ? How long can you stand comfortably? within 30 minutes   ? Patient Stated Goals reduced pain, play with her grandchildren, clean her house, garden   ? Currently in Pain? Yes   ? Pain Score 6    ? Pain Location Back   ? Pain Orientation Mid   ? Pain Onset 1 to 4 weeks ago   ? ?  ?  ? ?  ? ? ? ? ? ? ? ? ? ? ? ? ? ? ? ? ? ? ? ? [facility] Adult PT Treatment/Exercise - 05/30/21 0001   ? ?  ? Exercises  ? Exercises Shoulder   ?  ? Shoulder Exercises: Seated  ? Retraction Both   2 minutes  ? Other Seated Exercises Slouch/overcorrect   30 reps  ? Other Seated Exercises Shoulder circles   15 reps each  ?  ? Shoulder Exercises:  ROM/Strengthening  ? Nustep L2 x 5.5 minutes   ?  ? Modalities  ? Modalities Electrical Stimulation   ?  ? Electrical Stimulation  ? Electrical Stimulation Location Thoracic paraspinals bilaterally   ? Electrical Stimulation Action IFC   ? Electrical Stimulation Parameters 80-150 Hz w/ 40% scan x 15 minutes   ? Electrical Stimulation Goals Pain;Tone   ?  ? Manual Therapy  ? Manual Therapy Joint mobilization;Soft tissue mobilization   ? Joint Mobilization Grade I-III thoracic CPA's   ? Soft tissue mobilization bilateral thoracic paraspinals and periscapular musculature   ? ?  ?  ? ?  ? ? ? ? ? ? ? ? ? ? ? ? ? ? ? PT Long Term Goals - 05/23/21 1744   ? ?  ? PT LONG TERM GOAL #1  ? Title Patient will be independent with her HEP.   ? Time 4   ? Period Weeks   ? Status New   ? Target Date 06/20/21   ?  ? PT LONG TERM GOAL #2  ? Title Patient  will be able to play with her grandchildren without her familiar thoracic pain exceeding a 7/10.   ? Time 4   ? Period Weeks   ? Status New   ? Target Date 06/20/21   ?  ? PT LONG TERM GOAL #3  ? Title Patient will be able to stand for at least 45 minutes without being limited by her familiar thoracic pain.   ? Time 4   ? Period Weeks   ? Status New   ? Target Date 06/20/21   ? ?  ?  ? ?  ? ? ? ? ? ? ? ? Plan - 05/30/21 1646   ? ? Clinical Impression Statement Patient was introduced to multiple new interventions for improved scapular mobility. She required minimal cuing with today's new activities for proper pacing and force production to limit her familiar pain. She experienced a moderate increase in her familair pain with today's active interventions, but this was able to be significantly reduced with manual therapy and electrical stimulation. Manual therapy focused on thoracic joint mobilizations and soft tissue mobilization to the scapular retractors. She reported feeling good upon the conclusion of treatment as she was not hurting. She continues to require skilled physical  therapy to address her remaining impairments to return to her prior level of function.   ? Personal Factors and Comorbidities Other   ? Examination-Activity Limitations Carry;Lift;Stand;Sit;Caring for Others   ? Examination-Participation Restrictions Driving;Other;Cleaning   ? Stability/Clinical Decision Making Evolving/Moderate complexity   ? Rehab Potential Good   ? PT Frequency 3x / week   2-3x / week  ? PT Duration 4 weeks   ? PT Treatment/Interventions Cryotherapy;Electrical Stimulation;Moist Heat;Functional mobility training;Therapeutic activities;Therapeutic exercise;Neuromuscular re-education;Manual techniques;Patient/family education;Dry needling;Taping   ? PT Next Visit Plan UBE, scapular stabilization, STM to thoracic musculature, and modalities as needed   ? Consulted and Agree with Plan of Care Patient   ? ?  ?  ? ?  ? ? ?Patient will benefit from skilled therapeutic intervention in order to improve the following deficits and impairments:  Decreased range of motion, Difficulty walking, Decreased activity tolerance, Pain, Hypomobility, Decreased strength, Decreased mobility ? ?Visit Diagnosis: ?Pain in thoracic spine ? ? ? ? ?Problem List ?Patient Active Problem List  ? Diagnosis Date Noted  ? Type 2 diabetes mellitus with hypertriglyceridemia ([place]) 07/11/2020  ? Wound of right leg 12/31/2019  ? Stress incontinence 06/23/2015  ? HTN (hypertension) 05/20/2015  ? Back pain with sciatica 02/03/2015  ? DM2 (diabetes mellitus, type 2) ([place]) 02/03/2015  ? ? ?Darlin Coco, PT ?05/30/2021, 5:37 PM ? ?[facility] ?[place] ?[place], [place], [address] ?Phone: [phone]   Fax:  [phone] ? ?Name: Kimberly Donaldson ?MRN: [id number] ?Date of Birth: 1956/03/03 ? ? ? ?

## 2021-06-01 ENCOUNTER — Ambulatory Visit: Payer: Medicare HMO

## 2021-06-01 ENCOUNTER — Other Ambulatory Visit: Payer: Self-pay

## 2021-06-01 DIAGNOSIS — M546 Pain in thoracic spine: Secondary | ICD-10-CM | POA: Diagnosis not present

## 2021-06-01 NOTE — Therapy (Signed)
[place] ?[facility] ?[place] ?[place], [place], [address] ?Phone: [phone]   Fax:  [phone] ? ?Physical Therapy Treatment ? ?Patient Details  ?Name: Kimberly Donaldson ?MRN: [id number] ?Date of Birth: 1955/05/09 ?Referring Provider (PT): Dettinger, MD ? ? ?Encounter Date: 06/01/2021 ? ? PT End of Session - 06/01/21 1649   ? ? Visit Number 3   ? Number of Visits 12   ? Date for PT Re-Evaluation 07/21/21   ? PT Start Time 2458   ? PT Stop Time 1728   ? PT Time Calculation (min) 42 min   ? Activity Tolerance Patient tolerated treatment well   ? Behavior During Therapy [facility] for tasks assessed/performed   ? ?  ?  ? ?  ? ? ?Past Medical History:  ?Diagnosis Date  ? Diabetes mellitus without complication ([place])   ? Hypertension   ? Retina disorder, bilateral   ? ? ?Past Surgical History:  ?Procedure Laterality Date  ? CESAREAN SECTION    ? COLONOSCOPY  11/11/2007  ? Dr.Jacobs  ? FOOT SURGERY    ? WRIST SURGERY    ? ? ?There were no vitals filed for this visit. ? ? Subjective Assessment - 06/01/21 1646   ? ? Subjective Patient reports that her back started really bothering her about one hour after her last appointment and it lasted all night. Her pain then eased up in the morning, but then increased again later Wednesday afternoon. She had no pain this morning. However, her pain began around 12:30 after going shopping and doing a lot of other activities.   ? Pertinent History chronic back pain   ? Limitations Walking;Standing;Sitting;House hold activities   ? How long can you stand comfortably? within 30 minutes   ? Patient Stated Goals reduced pain, play with her grandchildren, clean her house, garden   ? Currently in Pain? Yes   ? Pain Score 1    ? Pain Location Back   ? Pain Orientation Left;Mid   ? Pain Onset 1 to 4 weeks ago   ? ?  ?  ? ?  ? ? ? ? ? ? ? ? ? ? ? ? ? ? ? ? ? ? ? ? [place] Adult PT Treatment/Exercise - 06/01/21 0001   ? ?  ? Shoulder Exercises: ROM/Strengthening  ?  Nustep L3 x 12 minutes   ?  ? Modalities  ? Modalities Electrical Stimulation   ?  ? Electrical Stimulation  ? Electrical Stimulation Location Left thoracic paraspinals   ? Electrical Stimulation Action pre mod   ? Electrical Stimulation Parameters 80-150 Hz x 15 minutes   ? Electrical Stimulation Goals Tone   ?  ? Manual Therapy  ? Manual Therapy Joint mobilization;Soft tissue mobilization   ? Joint Mobilization Grade I-III thoracic CPA's   ? Soft tissue mobilization left thoracic paraspinals  and lower trapezius   ? ?  ?  ? ?  ? ? ? ? ? ? ? ? ? ? ? ? ? ? ? PT Long Term Goals - 05/23/21 1744   ? ?  ? PT LONG TERM GOAL #1  ? Title Patient will be independent with her HEP.   ? Time 4   ? Period Weeks   ? Status New   ? Target Date 06/20/21   ?  ? PT LONG TERM GOAL #2  ? Title Patient will be able to play with her grandchildren without her familiar thoracic pain exceeding a 7/10.   ?  Time 4   ? Period Weeks   ? Status New   ? Target Date 06/20/21   ?  ? PT LONG TERM GOAL #3  ? Title Patient will be able to stand for at least 45 minutes without being limited by her familiar thoracic pain.   ? Time 4   ? Period Weeks   ? Status New   ? Target Date 06/20/21   ? ?  ?  ? ?  ? ? ? ? ? ? ? ? Plan - 06/01/21 1650   ? ? Clinical Impression Statement Treatment focused on manual therapy and light scapular mobility due to the reported flare up after her last appointment. Manual therapy focused on soft tissue mobilization to the left thoracic paraspinals and lower trapezius as this was able to completely relieve her symptoms. Electrical stimulation was then utilized to reduce the muscular tone to the thoracic paraspinals. No adverse response or redness noted after the removal of this modality. She reported that her back felt great upon the conclusion of treatment. Plan to reintroduce active scapular interventions at her next appointment for pain free mobility. She continues to require skilled physical therapy to address her  remaining impairments to return to her prior level of function.   ? Personal Factors and Comorbidities Other   ? Examination-Activity Limitations Carry;Lift;Stand;Sit;Caring for Others   ? Examination-Participation Restrictions Driving;Other;Cleaning   ? Stability/Clinical Decision Making Evolving/Moderate complexity   ? Rehab Potential Good   ? PT Frequency 3x / week   2-3x / week  ? PT Duration 4 weeks   ? PT Treatment/Interventions Cryotherapy;Electrical Stimulation;Moist Heat;Functional mobility training;Therapeutic activities;Therapeutic exercise;Neuromuscular re-education;Manual techniques;Patient/family education;Dry needling;Taping   ? PT Next Visit Plan UBE, scapular stabilization, STM to thoracic musculature, and modalities as needed   ? Consulted and Agree with Plan of Care Patient   ? ?  ?  ? ?  ? ? ?Patient will benefit from skilled therapeutic intervention in order to improve the following deficits and impairments:  Decreased range of motion, Difficulty walking, Decreased activity tolerance, Pain, Hypomobility, Decreased strength, Decreased mobility ? ?Visit Diagnosis: ?Pain in thoracic spine ? ? ? ? ?Problem List ?Patient Active Problem List  ? Diagnosis Date Noted  ? Type 2 diabetes mellitus with hypertriglyceridemia ([place]) 07/11/2020  ? Wound of right leg 12/31/2019  ? Stress incontinence 06/23/2015  ? HTN (hypertension) 05/20/2015  ? Back pain with sciatica 02/03/2015  ? DM2 (diabetes mellitus, type 2) ([place]) 02/03/2015  ? ? ?Darlin Coco, PT ?06/01/2021, 5:31 PM ? ?[place] ?[facility] ?[place] ?[place], [place], [address] ?Phone: [phone]   Fax:  [phone] ? ?Name: Kimberly Donaldson ?MRN: [id number] ?Date of Birth: 1956/02/25 ? ? ? ?

## 2021-06-06 ENCOUNTER — Other Ambulatory Visit: Payer: Self-pay

## 2021-06-06 ENCOUNTER — Ambulatory Visit: Payer: Medicare HMO

## 2021-06-06 DIAGNOSIS — M546 Pain in thoracic spine: Secondary | ICD-10-CM

## 2021-06-06 NOTE — Therapy (Signed)
[place] ?[facility] ?[address] ?[place], [place], [address] ?Phone: [phone]   Fax:  [phone] ? ?Physical Therapy Treatment ? ?Patient Details  ?Name: Kimberly Donaldson ?MRN: [id number] ?Date of Birth: 1955/07/09 ?Referring Provider (PT): Dettinger, MD ? ? ?Encounter Date: 06/06/2021 ? ? PT End of Session - 06/06/21 1645   ? ? Visit Number 4   ? Number of Visits 12   ? Date for PT Re-Evaluation 07/21/21   ? PT Start Time 1884   ? PT Stop Time 1660   ? PT Time Calculation (min) 46 min   ? Activity Tolerance Patient tolerated treatment well   ? Behavior During Therapy [facility] for tasks assessed/performed   ? ?  ?  ? ?  ? ? ?Past Medical History:  ?Diagnosis Date  ? Diabetes mellitus without complication ([place])   ? Hypertension   ? Retina disorder, bilateral   ? ? ?Past Surgical History:  ?Procedure Laterality Date  ? CESAREAN SECTION    ? COLONOSCOPY  11/11/2007  ? Dr.Jacobs  ? FOOT SURGERY    ? WRIST SURGERY    ? ? ?There were no vitals filed for this visit. ? ? Subjective Assessment - 06/06/21 1646   ? ? Subjective Patient reports that she is not having any pain currently. However, she it was really hurting over the weekend as she tried to help at her granddaughters birthday party. She notes that it was also hurting quite a bit this morning as it was making her irritable.   ? Pertinent History chronic back pain   ? Limitations Walking;Standing;Sitting;House hold activities   ? How long can you stand comfortably? within 30 minutes   ? Patient Stated Goals reduced pain, play with her grandchildren, clean her house, garden   ? Currently in Pain? No/denies   ? Pain Onset 1 to 4 weeks ago   ? ?  ?  ? ?  ? ? ? ? ? ? ? ? ? ? ? ? ? ? ? ? ? ? ? ? [place] Adult PT Treatment/Exercise - 06/06/21 0001   ? ?  ? Shoulder Exercises: Seated  ? Other Seated Exercises Thoracic extension   in chair; 20 reps  ?  ? Shoulder Exercises: Standing  ? Extension 20 reps;Both   ? Extension Weight (lbs) Blue XTS    ?  ? Shoulder Exercises: ROM/Strengthening  ? Nustep L5 x 12 minutes   ?  ? Shoulder Exercises: Stretch  ? Table Stretch - Flexion 10 seconds   10 reps  ?  ? Shoulder Exercises: Power Tower  ? Row 25 reps   ? Row Limitations 10#   ?  ? Modalities  ? Modalities Electrical Stimulation   ?  ? Electrical Stimulation  ? Electrical Stimulation Location Left thoracic paraspinals   no redness or adverse response to this modality  ? Electrical Stimulation Action pre mod   ? Electrical Stimulation Parameters 80-150 Hz x 15 minutes   ? Electrical Stimulation Goals Tone   ?  ? Manual Therapy  ? Manual Therapy Joint mobilization;Soft tissue mobilization   ? Joint Mobilization Grade I-III thoracic CPA's   ? Soft tissue mobilization left thoracic paraspinals  and lower trapezius   ? ?  ?  ? ?  ? ? ? ? ? ? ? ? ? ? ? ? ? ? ? PT Long Term Goals - 05/23/21 1744   ? ?  ? PT LONG TERM GOAL #1  ? Title  Patient will be independent with her HEP.   ? Time 4   ? Period Weeks   ? Status New   ? Target Date 06/20/21   ?  ? PT LONG TERM GOAL #2  ? Title Patient will be able to play with her grandchildren without her familiar thoracic pain exceeding a 7/10.   ? Time 4   ? Period Weeks   ? Status New   ? Target Date 06/20/21   ?  ? PT LONG TERM GOAL #3  ? Title Patient will be able to stand for at least 45 minutes without being limited by her familiar thoracic pain.   ? Time 4   ? Period Weeks   ? Status New   ? Target Date 06/20/21   ? ?  ?  ? ?  ? ? ? ? ? ? ? ? Plan - 06/06/21 1656   ? ? Clinical Impression Statement Patient was progressed with resisted rows and shoulder extension for periscapular engagment. She required minimal cuing with resisted rows to faciliate scapular retraction to isolate periscapular engagement. Manual therapy focused on soft tissue mobilization to her left scapular stabilizers and paraspinals to reduce her pain and muscular tone with mild results. She reported feeling good upon the conclusion of treatment. She  continues to require skilled physical therapy to address her remaining impairments to return to her prior level of function.   ? Personal Factors and Comorbidities Other   ? Examination-Activity Limitations Carry;Lift;Stand;Sit;Caring for Others   ? Examination-Participation Restrictions Driving;Other;Cleaning   ? Stability/Clinical Decision Making Evolving/Moderate complexity   ? Rehab Potential Good   ? PT Frequency 3x / week   2-3x / week  ? PT Duration 4 weeks   ? PT Treatment/Interventions Cryotherapy;Electrical Stimulation;Moist Heat;Functional mobility training;Therapeutic activities;Therapeutic exercise;Neuromuscular re-education;Manual techniques;Patient/family education;Dry needling;Taping   ? PT Next Visit Plan UBE, scapular stabilization, STM to thoracic musculature, and modalities as needed   ? Consulted and Agree with Plan of Care Patient   ? ?  ?  ? ?  ? ? ?Patient will benefit from skilled therapeutic intervention in order to improve the following deficits and impairments:  Decreased range of motion, Difficulty walking, Decreased activity tolerance, Pain, Hypomobility, Decreased strength, Decreased mobility ? ?Visit Diagnosis: ?Pain in thoracic spine ? ? ? ? ?Problem List ?Patient Active Problem List  ? Diagnosis Date Noted  ? Type 2 diabetes mellitus with hypertriglyceridemia ([place]) 07/11/2020  ? Wound of right leg 12/31/2019  ? Stress incontinence 06/23/2015  ? HTN (hypertension) 05/20/2015  ? Back pain with sciatica 02/03/2015  ? DM2 (diabetes mellitus, type 2) ([place]) 02/03/2015  ? ? ?Darlin Coco, PT ?06/06/2021, 5:33 PM ? ?[place] ?[facility] ?[address] ?[place], [place], [address] ?Phone: [phone]   Fax:  [phone] ? ?Name: Kimberly Donaldson ?MRN: [id number] ?Date of Birth: 08-19-1955 ? ? ? ?

## 2021-06-08 ENCOUNTER — Telehealth: Payer: Self-pay | Admitting: Family Medicine

## 2021-06-08 ENCOUNTER — Ambulatory Visit: Payer: Medicare HMO

## 2021-06-08 ENCOUNTER — Other Ambulatory Visit: Payer: Self-pay

## 2021-06-08 DIAGNOSIS — M546 Pain in thoracic spine: Secondary | ICD-10-CM

## 2021-06-08 NOTE — Therapy (Signed)
[place] ?[facility] ?[place] ?[place], [place], [address] ?Phone: [phone]   Fax:  [phone] ? ?Physical Therapy Treatment ? ?Patient Details  ?Name: Kimberly Donaldson ?MRN: [id number] ?Date of Birth: 06-21-1955 ?Referring Provider (PT): Dettinger, MD ? ? ?Encounter Date: 06/08/2021 ? ? PT End of Session - 06/08/21 1650   ? ? Visit Number 5   ? Number of Visits 12   ? Date for PT Re-Evaluation 07/21/21   ? PT Start Time 1651   Patient arrived late and was on the phone which delayed the start of treatment.  ? PT Stop Time 1728   ? PT Time Calculation (min) 37 min   ? Activity Tolerance Patient tolerated treatment well   ? Behavior During Therapy [facility] for tasks assessed/performed   ? ?  ?  ? ?  ? ? ?Past Medical History:  ?Diagnosis Date  ? Diabetes mellitus without complication ([place])   ? Hypertension   ? Retina disorder, bilateral   ? ? ?Past Surgical History:  ?Procedure Laterality Date  ? CESAREAN SECTION    ? COLONOSCOPY  11/11/2007  ? Dr.Jacobs  ? FOOT SURGERY    ? WRIST SURGERY    ? ? ?There were no vitals filed for this visit. ? ? Subjective Assessment - 06/08/21 1649   ? ? Subjective Patient reports that she feels wonderful today as she has not had any pain. She was able to go to church and do everything she wanted to today.   ? Pertinent History chronic back pain   ? Limitations Walking;Standing;Sitting;House hold activities   ? How long can you stand comfortably? within 30 minutes   ? Patient Stated Goals reduced pain, play with her grandchildren, clean her house, garden   ? Currently in Pain? No/denies   ? Pain Onset 1 to 4 weeks ago   ? ?  ?  ? ?  ? ? ? ? ? [facility] PT Assessment - 06/08/21 0001   ? ?  ? Observation/Other Assessments  ? Focus on Therapeutic Outcomes (FOTO)  65.82   ? ?  ?  ? ?  ? ? ? ? ? ? ? ? ? ? ? ? ? ? ? ? [facility] Adult PT Treatment/Exercise - 06/08/21 0001   ? ?  ? Therapeutic Activites   ? Therapeutic Activities Other Therapeutic Activities;Lifting   ?  Lifting Overhead   5 lbs; 20 reps  ? Other Therapeutic Activities Carrying   10 lbs; 2 laps (1 with each hand)  ?  ? Shoulder Exercises: Standing  ? Protraction Both;20 reps;Theraband   ? Theraband Level (Shoulder Protraction) Level 3 (Green)   ? Horizontal ABduction Both;20 reps;Theraband   ? Theraband Level (Shoulder Horizontal ABduction) Level 3 (Green)   ? External Rotation Both;20 reps;Theraband   ? Theraband Level (Shoulder External Rotation) Level 3 (Green)   ? Extension 20 reps;5 reps;Both   ? Extension Weight (lbs) Blue XTS   ? Row Both;5 reps;20 reps   ? Row Weight (lbs) Blue XTS   ? Diagonals Both;20 reps;Theraband   ? Theraband Level (Shoulder Diagonals) Level 3 (Green)   ?  ? Shoulder Exercises: ROM/Strengthening  ? Nustep L5 x 13 minutes   ? Wall Pushups 20 reps;Other (comment)   with scapular protraction  ?  ? Shoulder Exercises: Stretch  ? Table Stretch - Flexion 10 seconds;Other (comment)   12 reps  ? ?  ?  ? ?  ? ? ? ? ? ? ? ? ? ? ? ? ? ? ?  PT Long Term Goals - 05/23/21 1744   ? ?  ? PT LONG TERM GOAL #1  ? Title Patient will be independent with her HEP.   ? Time 4   ? Period Weeks   ? Status New   ? Target Date 06/20/21   ?  ? PT LONG TERM GOAL #2  ? Title Patient will be able to play with her grandchildren without her familiar thoracic pain exceeding a 7/10.   ? Time 4   ? Period Weeks   ? Status New   ? Target Date 06/20/21   ?  ? PT LONG TERM GOAL #3  ? Title Patient will be able to stand for at least 45 minutes without being limited by her familiar thoracic pain.   ? Time 4   ? Period Weeks   ? Status New   ? Target Date 06/20/21   ? ?  ?  ? ?  ? ? ? ? ? ? ? ? Plan - 06/08/21 1702   ? ? Clinical Impression Statement Patient presented to treatment reporting none of her familiar pain or discomfort. She notes that she was able to complete all of her activities today without any pain. She was progressed with multiple new interventions for improved scapular and upper extremity strength in addition  to work simulated activities. She required minimal cuing with resisted rows and extension for periscapular engagement. She experienced no pain or discomfort with any of today's interventions. She reported feeling thrilled that her back was not hurting upon the conclusion of treatment. She continues to require skilled physical therapy to address her remaining impairments to return to her prior level of function.   ? Personal Factors and Comorbidities Other   ? Examination-Activity Limitations Carry;Lift;Stand;Sit;Caring for Others   ? Examination-Participation Restrictions Driving;Other;Cleaning   ? Stability/Clinical Decision Making Evolving/Moderate complexity   ? Rehab Potential Good   ? PT Frequency 3x / week   2-3x / week  ? PT Duration 4 weeks   ? PT Treatment/Interventions Cryotherapy;Electrical Stimulation;Moist Heat;Functional mobility training;Therapeutic activities;Therapeutic exercise;Neuromuscular re-education;Manual techniques;Patient/family education;Dry needling;Taping   ? PT Next Visit Plan UBE, scapular stabilization, STM to thoracic musculature, and modalities as needed   ? Consulted and Agree with Plan of Care Patient   ? ?  ?  ? ?  ? ? ?Patient will benefit from skilled therapeutic intervention in order to improve the following deficits and impairments:  Decreased range of motion, Difficulty walking, Decreased activity tolerance, Pain, Hypomobility, Decreased strength, Decreased mobility ? ?Visit Diagnosis: ?Pain in thoracic spine ? ? ? ? ?Problem List ?Patient Active Problem List  ? Diagnosis Date Noted  ? Type 2 diabetes mellitus with hypertriglyceridemia ([place]) 07/11/2020  ? Wound of right leg 12/31/2019  ? Stress incontinence 06/23/2015  ? HTN (hypertension) 05/20/2015  ? Back pain with sciatica 02/03/2015  ? DM2 (diabetes mellitus, type 2) ([place]) 02/03/2015  ? ? ?Darlin Coco, PT ?06/08/2021, 5:42 PM ? ?[place] ?[facility] ?[address] ?[place],  [place], [address] ?Phone: [phone]   Fax:  [phone] ? ?Name: Kimberly Donaldson ?MRN: [id number] ?Date of Birth: 1955/06/16 ? ? ? ?

## 2021-06-08 NOTE — Telephone Encounter (Signed)
Patient aware that she is due after 4/17. ?

## 2021-06-09 ENCOUNTER — Telehealth (INDEPENDENT_AMBULATORY_CARE_PROVIDER_SITE_OTHER): Payer: Medicare HMO | Admitting: Family Medicine

## 2021-06-09 ENCOUNTER — Encounter: Payer: Self-pay | Admitting: Family Medicine

## 2021-06-09 ENCOUNTER — Telehealth: Payer: Medicare HMO

## 2021-06-09 DIAGNOSIS — L03116 Cellulitis of left lower limb: Secondary | ICD-10-CM | POA: Diagnosis not present

## 2021-06-09 MED ORDER — SULFAMETHOXAZOLE-TRIMETHOPRIM 800-160 MG PO TABS: 1.0000 | ORAL_TABLET | Freq: Two times a day (BID) | ORAL | 0 refills | Status: AC

## 2021-06-09 NOTE — Progress Notes (Signed)
? ?Virtual Visit via Video note ? ?I connected with Kimberly Donaldson on 06/09/21 at 8:45 AM by video and verified that I am speaking with the correct person using two identifiers. Kimberly Donaldson is currently located at home and her family is currently with her during visit. The provider, Loman Brooklyn, FNP is located in their office at time of visit. ? ?I discussed the limitations, risks, security and privacy concerns of performing an evaluation and management service by video and the availability of in person appointments. I also discussed with the patient that there may be a patient responsible charge related to this service. The patient expressed understanding and agreed to proceed. ? ?Subjective: ?PCP: Dettinger, Fransisca Kaufmann, MD ? ?Chief Complaint  ?Patient presents with  ? Cellulitis  ? ?Patient reports a scab that she got on the back of her left calf a month ago when she accidentally scratched herself with her toenail. She cannot get the area to heal and states it is not tender, red, and warm to the touch.  ? ? ?ROS: Per HPI ? ?Current Outpatient Medications:  ?  amLODipine (NORVASC) 5 MG tablet, Take 1 tablet (5 mg total) by mouth daily., Disp: 90 tablet, Rfl: 3 ?  aspirin EC 81 MG tablet, Take 81 mg by mouth daily. Swallow whole., Disp: , Rfl:  ?  baclofen (LIORESAL) 10 MG tablet, Take 1 tablet (10 mg total) by mouth 3 (three) times daily., Disp: 30 each, Rfl: 0 ?  ibuprofen (ADVIL,MOTRIN) 200 MG tablet, Take 800 mg by mouth every 6 (six) hours as needed for moderate pain., Disp: , Rfl:  ?  lisinopril (ZESTRIL) 20 MG tablet, Take 2 tablets (40 mg total) by mouth daily., Disp: 180 tablet, Rfl: 3 ?  loperamide (IMODIUM) 2 MG capsule, Take by mouth as needed for diarrhea or loose stools., Disp: , Rfl:  ?  Multiple Vitamins-Minerals (AIRBORNE PO), Take 1 tablet by mouth daily., Disp: , Rfl:  ?  Omega-3 Fatty Acids (FISH OIL PO), Take 1 tablet by mouth daily., Disp: , Rfl:  ?  rosuvastatin (CRESTOR) 5 MG tablet,  Take 1 tablet (5 mg total) by mouth daily., Disp: 90 tablet, Rfl: 3 ?  Semaglutide,0.25 or 0.'5MG'$ /DOS, (OZEMPIC, 0.25 OR 0.5 MG/DOSE,) 2 MG/1.5ML SOPN, Inject 0.5 mg into the skin once a week., Disp: 4.5 mL, Rfl: 3 ?  tobramycin (TOBREX) 0.3 % ophthalmic solution, 1 drop 4 (four) times daily., Disp: , Rfl:  ? ?Allergies  ?Allergen Reactions  ? Codeine   ?  REACTION: ringing in ears  ? Morphine   ?  REACTION: hives  ? Other   ?  sporinof - for nail fungus  ? ?Past Medical History:  ?Diagnosis Date  ? Diabetes mellitus without complication ([place])   ? Hypertension   ? Retina disorder, bilateral   ? ? ?Observations/Objective: ?Physical Exam ?Constitutional:   ?   General: She is not in acute distress. ?   Appearance: Normal appearance. She is not ill-appearing or toxic-appearing.  ?Eyes:  ?   General: No scleral icterus.    ?   Right eye: No discharge.     ?   Left eye: No discharge.  ?   Conjunctiva/sclera: Conjunctivae normal.  ?Pulmonary:  ?   Effort: Pulmonary effort is normal. No respiratory distress.  ?Skin: ?   Findings: Wound (size of a fingertip on the back of her left calf with erythema and swelling. Patient reports it is warmer than the surrounding skin.)  present.  ?Neurological:  ?   Mental Status: She is alert and oriented to person, place, and time.  ?Psychiatric:     ?   Mood and Affect: Mood normal.     ?   Behavior: Behavior normal.     ?   Thought Content: Thought content normal.     ?   Judgment: Judgment normal.  ? ?Assessment and Plan: ?1. Cellulitis of left lower extremity ?Cleanse wound twice daily with mild soap and water, pat dry, and cover with dry dressing.  ?- sulfamethoxazole-trimethoprim (BACTRIM DS) 800-160 MG tablet; Take 1 tablet by mouth 2 (two) times daily for 7 days.  Dispense: 14 tablet; Refill: 0 ? ? ?Follow Up Instructions: ?  ?I discussed the assessment and treatment plan with the patient. The patient was provided an opportunity to ask questions and all were answered. The patient  agreed with the plan and demonstrated an understanding of the instructions. ?  ?The patient was advised to call back or seek an in-person evaluation if the symptoms worsen or if the condition fails to improve as anticipated. ? ?The above assessment and management plan was discussed with the patient. The patient verbalized understanding of and has agreed to the management plan. Patient is aware to call the clinic if symptoms persist or worsen. Patient is aware when to return to the clinic for a follow-up visit. Patient educated on when it is appropriate to go to the emergency department.  ? ?Time call ended: 8:56 AM ? ?I provided 11 minutes of face-to-face time during this encounter. ? ? ?Hendricks Limes, MSN, APRN, FNP-C ?[place] ?06/09/21 ? ?

## 2021-06-13 ENCOUNTER — Ambulatory Visit: Payer: Medicare HMO

## 2021-06-15 ENCOUNTER — Ambulatory Visit: Payer: Medicare HMO

## 2021-06-15 DIAGNOSIS — M546 Pain in thoracic spine: Secondary | ICD-10-CM

## 2021-06-15 NOTE — Therapy (Addendum)
[facility] [place], [place], [address] Phone: [phone]   Fax:  [phone]  Physical Therapy Treatment  Patient Details  Name: Kimberly Donaldson MRN: [id number] Date of Birth: 22-Oct-1955 Referring Provider (PT): Dettinger, MD   Encounter Date: 06/15/2021   PT End of Session - 06/15/21 1656     Visit Number 6    Number of Visits 12    Date for PT Re-Evaluation 07/21/21    PT Start Time 1645    PT Stop Time 7741   patient requested to leave early   PT Time Calculation (min) 30 min    Activity Tolerance Patient tolerated treatment well    Behavior During Therapy [facility] for tasks assessed/performed             Past Medical History:  Diagnosis Date   Diabetes mellitus without complication ([place])    Hypertension    Retina disorder, bilateral     Past Surgical History:  Procedure Laterality Date   CESAREAN SECTION     COLONOSCOPY  11/11/2007   Dr.Jacobs   FOOT SURGERY     WRIST SURGERY      There were no vitals filed for this visit.   Subjective Assessment - 06/15/21 1646     Subjective Patient reports that she has been great since her last appointment. She felt a little discomfort yesterday around her shoulder blade.    Pertinent History chronic back pain    Limitations Walking;Standing;Sitting;House hold activities    How long can you stand comfortably? within 30 minutes    Patient Stated Goals reduced pain, play with her grandchildren, clean her house, garden    Currently in Pain? No/denies    Pain Onset 1 to 4 weeks ago                [facility] PT Assessment - 06/15/21 0001       Observation/Other Assessments   Focus on Therapeutic Outcomes (FOTO)  98.92                           [facility] Adult PT Treatment/Exercise - 06/15/21 0001       Shoulder Exercises: Standing   External Rotation Both;Theraband   2.5 minutes   Theraband Level (Shoulder External Rotation) Level 3 (Green)     Extension Both   alternating between neutral and 45 degrees abduction; 2 minutes   Extension Weight (lbs) Blue XTS    Diagonals Both;20 reps;Theraband    Theraband Level (Shoulder Diagonals) Level 3 (Green)      Shoulder Exercises: ROM/Strengthening   UBE (Upper Arm Bike) 120 RPM x 10 minutes                          PT Long Term Goals - 06/15/21 1658       PT LONG TERM GOAL #1   Title Patient will be independent with her HEP.    Time 4    Period Weeks    Status Achieved    Target Date 06/20/21      PT LONG TERM GOAL #2   Title Patient will be able to play with her grandchildren without her familiar thoracic pain exceeding a 7/10.    Time 4    Period Weeks    Status Achieved    Target Date 06/20/21      PT LONG TERM GOAL #3   Title Patient will  be able to stand for at least 45 minutes without being limited by her familiar thoracic pain.    Time 4    Period Weeks    Status Achieved    Target Date 06/20/21                   Plan - 06/15/21 1656     Clinical Impression Statement Patient presented to her appointment reporting no significant increase in her pain since her last appointment. She was progressed with familiar interventions with moderate difficulty. However, none of today's interventions recreated any pain or discomfort. She reported feeling comfortable with today's interventions and requested to leave early. She will be discharged at her next visit with an updated HEP barring any exacerbation in her familiar symptoms.    Personal Factors and Comorbidities Other    Examination-Activity Limitations Carry;Lift;Stand;Sit;Caring for Others    Examination-Participation Restrictions Driving;Other;Cleaning    Stability/Clinical Decision Making Evolving/Moderate complexity    Rehab Potential Good    PT Frequency 3x / week   2-3x / week   PT Duration 4 weeks    PT Treatment/Interventions Cryotherapy;Electrical Stimulation;Moist Heat;Functional  mobility training;Therapeutic activities;Therapeutic exercise;Neuromuscular re-education;Manual techniques;Patient/family education;Dry needling;Taping    PT Next Visit Plan UBE, scapular stabilization, STM to thoracic musculature, and modalities as needed    Consulted and Agree with Plan of Care Patient             Patient will benefit from skilled therapeutic intervention in order to improve the following deficits and impairments:  Decreased range of motion, Difficulty walking, Decreased activity tolerance, Pain, Hypomobility, Decreased strength, Decreased mobility  Visit Diagnosis: Pain in thoracic spine     Problem List Patient Active Problem List   Diagnosis Date Noted   Type 2 diabetes mellitus with hypertriglyceridemia ([place]) 07/11/2020   Wound of right leg 12/31/2019   Stress incontinence 06/23/2015   HTN (hypertension) 05/20/2015   Back pain with sciatica 02/03/2015   DM2 (diabetes mellitus, type 2) ([place]) 02/03/2015   PHYSICAL THERAPY DISCHARGE SUMMARY  Visits from Start of Care: 6  Current functional level related to goals / functional outcomes: Patient was able to meet all her goals for physical therapy.    Remaining deficits: None    Education / Equipment: HEP    Patient agrees to discharge. Patient goals were met. Patient is being discharged due to meeting the stated rehab goals.   Darlin Coco, PT 06/15/2021, 5:25 PM  [facility] [place], [place], [address] Phone: [phone]   Fax:  [phone]  Name: Kimberly Donaldson MRN: [id number] Date of Birth: June 30, 1955

## 2021-06-20 ENCOUNTER — Encounter: Payer: Self-pay | Admitting: Family Medicine

## 2021-06-20 ENCOUNTER — Ambulatory Visit: Payer: Medicare HMO

## 2021-06-20 ENCOUNTER — Ambulatory Visit (INDEPENDENT_AMBULATORY_CARE_PROVIDER_SITE_OTHER): Payer: Medicare HMO | Admitting: Family Medicine

## 2021-06-20 VITALS — BP 137/80 | HR 60 | Temp 98.3°F | Ht 62.0 in | Wt 188.6 lb

## 2021-06-20 DIAGNOSIS — L089 Local infection of the skin and subcutaneous tissue, unspecified: Secondary | ICD-10-CM

## 2021-06-20 DIAGNOSIS — L03116 Cellulitis of left lower limb: Secondary | ICD-10-CM

## 2021-06-20 DIAGNOSIS — A46 Erysipelas: Secondary | ICD-10-CM

## 2021-06-20 MED ORDER — CEPHALEXIN 500 MG PO CAPS: 500.0000 mg | ORAL_CAPSULE | Freq: Four times a day (QID) | ORAL | 0 refills | Status: AC

## 2021-06-20 MED ORDER — SILVER SULFADIAZINE 1 % EX CREA: 1.0000 "application " | TOPICAL_CREAM | Freq: Every day | CUTANEOUS | 0 refills | Status: DC

## 2021-06-20 MED ORDER — CEFTRIAXONE SODIUM 1 G IJ SOLR
1.0000 g | Freq: Once | INTRAMUSCULAR | Status: AC
  Administered 2021-06-20: 1 g via INTRAMUSCULAR

## 2021-06-20 NOTE — Patient Instructions (Signed)
You got a rocephin shot today ?Start the new oral antibiotic tomorrow.  Take 1 capsule every 6 hours for 10 days. ?If no marked improvement by day 6, please call to be reseen. ?I have sent in a topical antibiotic I want you to use for dressing changes as well ? ?Erysipelas ?Erysipelas is an infection that affects the skin and tissues near the surface of the skin. It causes the skin to become red, swollen, and painful. The infection is most common on the legs but may also affect other areas, such as the face. With treatment, the infection usually goes away in a few days. If not treated, the infection can spread or lead to other problems, such as collections of pus (abscesses). ?What are the causes? ?This condition is caused by bacteria. Most often, it is caused by bacteria called streptococci.  ?Bacteria may get into the skin through a break in the skin, such as: ?A cut or scrape. ?An incision from surgery. ?A burn. ?An insect bite. ?An open sore. ?A crack in the skin. ?Sometimes, it is not known how the bacteria infected the skin. ?What increases the risk? ?You are more likely to develop this condition if you: ?Are a young child. ?Are an older adult. ?Have a weakened disease-fighting system (immune system), such as having HIV or AIDS. ?Have diabetes. ?Drink a lot of alcohol. ?Had recent surgery. ?Have a yeast infection of the skin. ?Have swollen legs. ?What are the signs or symptoms? ?The infection causes a reddened area on the skin. This reddened area may: ?Be painful and swollen. ?Have a clear border around it. ?Feel itchy and hot. ?Develop blisters or abscesses. ?Other symptoms may include: ?Fever. ?Chills. ?Nausea and vomiting. ?Swollen glands (lymph nodes), such as in the neck. ?Headache. ?Feeling tired (fatigue). ?Loss of appetite. ?How is this diagnosed? ?This condition is diagnosed based on: ?A physical exam. Your health care provider will examine your skin closely. ?Your symptoms and medical history. ?How  is this treated? ?This condition is treated with antibiotic medicine. Symptoms usually get better within a few days after starting antibiotics. ?Follow these instructions at home: ?Medicines ?Take other over-the-counter and prescription medicines only as told by your health care provider. ?Take your antibiotic medicine as told by your health care provider. Do not stop taking the antibiotic even if your condition starts to improve. ?Ask your health care provider if it is safe for you to take medicines for pain as needed, such as acetaminophen or ibuprofen. ?General instructions ? ?If the affected area is on an arm or leg, raise (elevate) the affected arm or leg above the level of your heart while you are sitting or lying down. ?Do not put any creams or lotions on the affected area of your skin unless your health care provider tells you to do that. ?Do not share bedding, towels, or washcloths (linens) with other people. Use only your own linens to prevent the infection from spreading to others. ?Follow instructions from your health care provider about how to take care of your wound. Make sure you: ?Wash your hands with soap and water for at least 20 seconds before and after you change your bandage (dressing). If soap and water are not available, use hand sanitizer. ?Change your dressing as told by your health care provider. ?Keep all follow-up visits. This is important. ?Contact a health care provider if: ?Your symptoms do not improve within 1-2 days of starting treatment. ?You develop new symptoms. ?You have a fever. ?You feel generally  sick (malaise) with muscle aches and pains. ?Get help right away if: ?Your symptoms get worse. ?You develop vomiting or diarrhea that does not go away. ?Your red area gets larger or turns dark in color. ?You notice red streaks coming from the infected area. ?Summary ?Erysipelas is an infection affecting the skin and tissues near the surface of the skin. It causes the skin to become  red, swollen, and painful. ?This condition is caused by bacteria. Most often, it is caused by bacteria called streptococci. ?Bacteria may enter through a break in the skin. Sometimes, it is not known how the bacteria infected the skin. ?This condition is treated with antibiotic medicine. Symptoms usually get better within a few days after starting antibiotics. ?This information is not intended to replace advice given to you by your health care provider. Make sure you discuss any questions you have with your health care provider. ?Document Revised: 08/06/2019 Document Reviewed: 08/05/2019 ?Elsevier Patient Education ? [place]. ? ?

## 2021-06-20 NOTE — Progress Notes (Signed)
? ?Subjective: ?CC: Cellulitis ?PCP: Dettinger, Fransisca Kaufmann, MD ?SJG:Kimberly Donaldson is a 66 y.o. female presenting to clinic today for: ? ?1.  Left leg cellulitis ?Patient had a video visit on 06/09/2021 for left leg cellulitis and was treated with Septra DS.  At that time she noticed that the redness was quite more extensive.  The redness has gotten slightly better but after completion of the antibiotics it seems to be getting worse again and has become tender.  She denies any significant drainage.  She had cellulitis in the past but no history of MRSA.  She is a type II diabetic and has well-controlled diabetes.  Not on any anticoagulants. ? ? ?ROS: Per HPI ? ?Allergies  ?Allergen Reactions  ? Codeine   ?  REACTION: ringing in ears  ? Morphine   ?  REACTION: hives  ? Other   ?  sporinof - for nail fungus  ? ?Past Medical History:  ?Diagnosis Date  ? Diabetes mellitus without complication ([place])   ? Hypertension   ? Retina disorder, bilateral   ? ? ?Current Outpatient Medications:  ?  amLODipine (NORVASC) 5 MG tablet, Take 1 tablet (5 mg total) by mouth daily., Disp: 90 tablet, Rfl: 3 ?  aspirin EC 81 MG tablet, Take 81 mg by mouth daily. Swallow whole., Disp: , Rfl:  ?  baclofen (LIORESAL) 10 MG tablet, Take 1 tablet (10 mg total) by mouth 3 (three) times daily., Disp: 30 each, Rfl: 0 ?  cephALEXin (KEFLEX) 500 MG capsule, Take 1 capsule (500 mg total) by mouth 4 (four) times daily for 10 days., Disp: 40 capsule, Rfl: 0 ?  ibuprofen (ADVIL,MOTRIN) 200 MG tablet, Take 800 mg by mouth every 6 (six) hours as needed for moderate pain., Disp: , Rfl:  ?  lisinopril (ZESTRIL) 20 MG tablet, Take 2 tablets (40 mg total) by mouth daily., Disp: 180 tablet, Rfl: 3 ?  loperamide (IMODIUM) 2 MG capsule, Take by mouth as needed for diarrhea or loose stools., Disp: , Rfl:  ?  Multiple Vitamins-Minerals (AIRBORNE PO), Take 1 tablet by mouth daily., Disp: , Rfl:  ?  Omega-3 Fatty Acids (FISH OIL PO), Take 1 tablet by mouth daily.,  Disp: , Rfl:  ?  rosuvastatin (CRESTOR) 5 MG tablet, Take 1 tablet (5 mg total) by mouth daily., Disp: 90 tablet, Rfl: 3 ?  Semaglutide,0.25 or 0.'5MG'$ /DOS, (OZEMPIC, 0.25 OR 0.5 MG/DOSE,) 2 MG/1.5ML SOPN, Inject 0.5 mg into the skin once a week., Disp: 4.5 mL, Rfl: 3 ?  silver sulfADIAZINE (SSD) 1 % cream, Apply 1 application. topically daily., Disp: 50 g, Rfl: 0 ?  tobramycin (TOBREX) 0.3 % ophthalmic solution, 1 drop 4 (four) times daily., Disp: , Rfl:  ? ?Current Facility-Administered Medications:  ?  cefTRIAXone (ROCEPHIN) injection 1 g, 1 g, Intramuscular, Once, Ronnie Doss M, DO ?Social History  ? ?Socioeconomic History  ? Marital status: Widowed  ?  Spouse name: Not on file  ? Number of children: Not on file  ? Years of education: Not on file  ? Highest education level: Not on file  ?Occupational History  ? Not on file  ?Tobacco Use  ? Smoking status: Every Day  ?  Packs/day: 1.00  ?  Types: Cigarettes  ? Smokeless tobacco: Never  ?Vaping Use  ? Vaping Use: Never used  ?Substance and Sexual Activity  ? Alcohol use: Yes  ?  Comment: occ  ? Drug use: Yes  ?  Types: Marijuana  ?  Comment: gummies every 2-3 months  ? Sexual activity: Not Currently  ?  Birth control/protection: Post-menopausal  ?Other Topics Concern  ? Not on file  ?Social History Narrative  ? Not on file  ? ?Social Determinants of Health  ? ?Financial Resource Strain: Low Risk   ? Difficulty of Paying Living Expenses: Not very hard  ?Food Insecurity: No Food Insecurity  ? Worried About Charity fundraiser in the Last Year: Never true  ? Ran Out of Food in the Last Year: Never true  ?Transportation Needs: No Transportation Needs  ? Lack of Transportation (Medical): No  ? Lack of Transportation (Non-Medical): No  ?Physical Activity: Inactive  ? Days of Exercise per Week: 0 days  ? Minutes of Exercise per Session: 0 min  ?Stress: No Stress Concern Present  ? Feeling of Stress : Only a little  ?Social Connections: Moderately Integrated  ?  Frequency of Communication with Friends and Family: More than three times a week  ? Frequency of Social Gatherings with Friends and Family: Once a week  ? Attends Religious Services: More than 4 times per year  ? Active Member of Clubs or Organizations: Yes  ? Attends Archivist Meetings: More than 4 times per year  ? Marital Status: Widowed  ?Intimate Partner Violence: Not At Risk  ? Fear of Current or Ex-Partner: No  ? Emotionally Abused: No  ? Physically Abused: No  ? Sexually Abused: No  ? ?Family History  ?Problem Relation Age of Onset  ? Cancer Mother   ? Liver cancer Mother   ? Colon polyps Father   ? Cancer Father   ? Diabetes Father   ? Bladder Cancer Father   ? Colon cancer Neg Hx   ? Esophageal cancer Neg Hx   ? Pancreatic cancer Neg Hx   ? Stomach cancer Neg Hx   ? Breast cancer Neg Hx   ? ? ?Objective: ?Office vital signs reviewed. ?BP 137/80   Pulse 60   Temp 98.3 ?F (36.8 ?C)   Ht '5\' 2"'$  (1.575 m)   Wt 188 lb 9.6 oz (85.5 kg)   SpO2 99%   BMI 34.50 kg/m?  ? ?Physical Examination:  ?General: Awake, alert, nontoxic female, No acute distress ?Skin: Baseball sized area of erythema with central wound with black necrotic tissue centrally.  There is warmth, tenderness and induration of the wound.  No palpable fluctuance.  No active purulent drainage or bleeding. ? ?Skin debridement: ?Verbal consent was obtained from the patient.  Not on any anticoagulants.  Does use daily aspirin and has well-controlled type 2 diabetes. ? ?Sterile forceps and sterile scissors were used to gently do bride the wound of the necrotic tissue that was noted centrally.  No purulence appreciated.  The wound is approximately 4 mm in diameter and 1 mm deep.  Wound was cleansed and SSD cream was applied with a bandage. ? ?Assessment/ Plan: ?66 y.o. female  ? ?Left leg cellulitis - Plan: cefTRIAXone (ROCEPHIN) injection 1 g, cephALEXin (KEFLEX) 500 MG capsule, silver sulfADIAZINE (SSD) 1 % cream ? ?Erysipelas - Plan:  cephALEXin (KEFLEX) 500 MG capsule, silver sulfADIAZINE (SSD) 1 % cream ? ?Infected wound - Plan: silver sulfADIAZINE (SSD) 1 % cream ? ?Going to treat her as erysipelas given lack of purulence.  She was given a Rocephin shot here in the office after we debrided her wound, see above note.  She will start oral Keflex 4 times daily tomorrow.  Wound care instructions were  reviewed with the patient.  If no significant improvement by day 6, I would like her to follow-up with PCP for reevaluation of this wound.  May need to adjust wound care regimen ? ?She understands red flag signs and symptoms warranting further evaluation prior to that time.  At this time she demonstrates no systemic signs of illness. ? ?No orders of the defined types were placed in this encounter. ? ?Meds ordered this encounter  ?Medications  ? cefTRIAXone (ROCEPHIN) injection 1 g  ? cephALEXin (KEFLEX) 500 MG capsule  ?  Sig: Take 1 capsule (500 mg total) by mouth 4 (four) times daily for 10 days.  ?  Dispense:  40 capsule  ?  Refill:  0  ? silver sulfADIAZINE (SSD) 1 % cream  ?  Sig: Apply 1 application. topically daily.  ?  Dispense:  50 g  ?  Refill:  0  ? ? ? ?Janora Norlander, DO ?[place] ?([phone] ? ?

## 2021-06-27 ENCOUNTER — Other Ambulatory Visit: Payer: Self-pay | Admitting: Vascular Surgery

## 2021-06-27 ENCOUNTER — Other Ambulatory Visit (HOSPITAL_COMMUNITY): Payer: Self-pay | Admitting: Vascular Surgery

## 2021-06-27 DIAGNOSIS — I739 Peripheral vascular disease, unspecified: Secondary | ICD-10-CM

## 2021-06-28 ENCOUNTER — Ambulatory Visit (INDEPENDENT_AMBULATORY_CARE_PROVIDER_SITE_OTHER): Payer: Medicare HMO

## 2021-06-28 ENCOUNTER — Ambulatory Visit: Payer: Medicare HMO | Admitting: Vascular Surgery

## 2021-06-28 ENCOUNTER — Encounter: Payer: Self-pay | Admitting: Vascular Surgery

## 2021-06-28 VITALS — BP 175/69 | HR 65 | Temp 98.4°F | Resp 18 | Ht 64.0 in | Wt 191.2 lb

## 2021-06-28 DIAGNOSIS — I739 Peripheral vascular disease, unspecified: Secondary | ICD-10-CM

## 2021-06-28 NOTE — H&P (View-Only) (Signed)
? ? ?[facility] [place] ? ?Patient name: Kimberly Donaldson MRN: [id number] DOB: [birth year] Sex: female ? ?REASON FOR CONSULT: Evaluation left leg claudication ? ?HPI: ?Kimberly Donaldson is a 66 y.o. female, who is here today for evaluation of left leg claudication.  She has a long term diabetic and cigarette smoker.  She reports that for the approximately 1 year she is having increasing severe left leg claudication.  This is very limiting to her ability to do her daily activities and also play with her grandchildren.  She is otherwise active.  She denies any cardiac difficulties. ? ?Past Medical History:  ?Diagnosis Date  ? Diabetes mellitus without complication ([place])   ? Hypertension   ? Retina disorder, bilateral   ? ? ?Family History  ?Problem Relation Age of Onset  ? Cancer Mother   ? Liver cancer Mother   ? Colon polyps Father   ? Cancer Father   ? Diabetes Father   ? Bladder Cancer Father   ? Colon cancer Neg Hx   ? Esophageal cancer Neg Hx   ? Pancreatic cancer Neg Hx   ? Stomach cancer Neg Hx   ? Breast cancer Neg Hx   ? ? ?SOCIAL HISTORY: ?Social History  ? ?Socioeconomic History  ? Marital status: Widowed  ?  Spouse name: Not on file  ? Number of children: Not on file  ? Years of education: Not on file  ? Highest education level: Not on file  ?Occupational History  ? Not on file  ?Tobacco Use  ? Smoking status: Every Day  ?  Packs/day: 1.00  ?  Types: Cigarettes  ? Smokeless tobacco: Never  ?Vaping Use  ? Vaping Use: Never used  ?Substance and Sexual Activity  ? Alcohol use: Yes  ?  Comment: occ  ? Drug use: Yes  ?  Types: Marijuana  ?  Comment: gummies every 2-3 months  ? Sexual activity: Not Currently  ?  Birth control/protection: Post-menopausal  ?Other Topics Concern  ? Not on file  ?Social History Narrative  ? Not on file  ? ?Social Determinants of Health  ? ?Financial Resource Strain: Low Risk   ? Difficulty of Paying Living Expenses: Not very hard   ?Food Insecurity: No Food Insecurity  ? Worried About Charity fundraiser in the Last Year: Never true  ? Ran Out of Food in the Last Year: Never true  ?Transportation Needs: No Transportation Needs  ? Lack of Transportation (Medical): No  ? Lack of Transportation (Non-Medical): No  ?Physical Activity: Inactive  ? Days of Exercise per Week: 0 days  ? Minutes of Exercise per Session: 0 min  ?Stress: No Stress Concern Present  ? Feeling of Stress : Only a little  ?Social Connections: Moderately Integrated  ? Frequency of Communication with Friends and Family: More than three times a week  ? Frequency of Social Gatherings with Friends and Family: Once a week  ? Attends Religious Services: More than 4 times per year  ? Active Member of Clubs or Organizations: Yes  ? Attends Archivist Meetings: More than 4 times per year  ? Marital Status: Widowed  ?Intimate Partner Violence: Not At Risk  ? Fear of Current or Ex-Partner: No  ? Emotionally Abused: No  ? Physically Abused: No  ? Sexually Abused: No  ? ? ?Allergies  ?Allergen Reactions  ? Codeine   ?  REACTION: ringing in ears  ? Morphine   ?  REACTION: hives  ? Other   ?  sporinof - for nail fungus  ? ? ?Current Outpatient Medications  ?Medication Sig Dispense Refill  ? amLODipine (NORVASC) 5 MG tablet Take 1 tablet (5 mg total) by mouth daily. 90 tablet 3  ? aspirin EC 81 MG tablet Take 81 mg by mouth daily. Swallow whole.    ? baclofen (LIORESAL) 10 MG tablet Take 1 tablet (10 mg total) by mouth 3 (three) times daily. 30 each 0  ? cephALEXin (KEFLEX) 500 MG capsule Take 1 capsule (500 mg total) by mouth 4 (four) times daily for 10 days. 40 capsule 0  ? ibuprofen (ADVIL,MOTRIN) 200 MG tablet Take 800 mg by mouth every 6 (six) hours as needed for moderate pain.    ? lisinopril (ZESTRIL) 20 MG tablet Take 2 tablets (40 mg total) by mouth daily. 180 tablet 3  ? loperamide (IMODIUM) 2 MG capsule Take by mouth as needed for diarrhea or loose stools.    ? Multiple  Vitamins-Minerals (AIRBORNE PO) Take 1 tablet by mouth daily.    ? Omega-3 Fatty Acids (FISH OIL PO) Take 1 tablet by mouth daily.    ? rosuvastatin (CRESTOR) 5 MG tablet Take 1 tablet (5 mg total) by mouth daily. 90 tablet 3  ? Semaglutide,0.25 or 0.'5MG'$ /DOS, (OZEMPIC, 0.25 OR 0.5 MG/DOSE,) 2 MG/1.5ML SOPN Inject 0.5 mg into the skin once a week. 4.5 mL 3  ? silver sulfADIAZINE (SSD) 1 % cream Apply 1 application. topically daily. 50 g 0  ? tobramycin (TOBREX) 0.3 % ophthalmic solution 1 drop 4 (four) times daily.    ? ?No current facility-administered medications for this visit.  ? ? ?REVIEW OF SYSTEMS:  ?'[X]'$  denotes positive finding, '[ ]'$  denotes negative finding ?Cardiac  Comments:  ?Chest pain or chest pressure:    ?Shortness of breath upon exertion:    ?Short of breath when lying flat:    ?Irregular heart rhythm:    ?    ?Vascular    ?Pain in calf, thigh, or hip brought on by ambulation: x   ?Pain in feet at night that wakes you up from your sleep:     ?Blood clot in your veins:    ?Leg swelling:     ?    ?Pulmonary    ?Oxygen at home:    ?Productive cough:     ?Wheezing:     ?    ?Neurologic    ?Sudden weakness in arms or legs:     ?Sudden numbness in arms or legs:     ?Sudden onset of difficulty speaking or slurred speech:    ?Temporary loss of vision in one eye:     ?Problems with dizziness:     ?    ?Gastrointestinal    ?Blood in stool:     ?Vomited blood:     ?    ?Genitourinary    ?Burning when urinating:     ?Blood in urine:    ?    ?Psychiatric    ?Major depression:     ?    ?Hematologic    ?Bleeding problems:    ?Problems with blood clotting too easily:    ?    ?Skin    ?Rashes or ulcers:    ?    ?Constitutional    ?Fever or chills:    ? ? ?PHYSICAL EXAM: ?Vitals:  ? 06/28/21 1528  ?BP: (!) 175/69  ?Pulse: 65  ?Resp: 18  ?Temp: 98.4 ?F (36.9 ?C)  ?  TempSrc: Temporal  ?SpO2: 96%  ?Weight: 191 lb 3.2 oz (86.7 kg)  ?Height: '5\' 4"'$  (1.626 m)  ? ? ?GENERAL: The patient is a well-nourished female, in no acute  distress. The vital signs are documented above. ?CARDIOVASCULAR: 2+ radial and 2+ femoral pulses bilaterally.  2+ right popliteal and dorsalis pedis pulse.  Absent left popliteal and pedal pulses ?PULMONARY: There is good air exchange  ?MUSCULOSKELETAL: There are no major deformities or cyanosis. ?NEUROLOGIC: No focal weakness or paresthesias are detected. ?SKIN: She does have an open ulcer above her Achilles on the left with approximately the tissue centimeter diameter and superficial ?PSYCHIATRIC: The patient has a normal affect. ? ?DATA:  ?Noninvasive studies arrives today reveal triphasic waveforms and ankle arm index of 0.92 on the right.  On the left she has monophasic flow with an ankle arm index of 0.44 ? ?MEDICAL ISSUES: ?Limiting claudication left leg.  I did explain that this is probably not limb threatening.  Her ulceration has been very slow to heal but appears to be more typical of venous disease.  I did explain that limited arterial flow will make this much more difficult to heal.  She reports that she is unable to tolerate this level of claudication and wishes further treatment.  I did explain that this is directly related to cigarette smoking and is just ongoing need to localize.  I did explain also the neck step would be arteriography.  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  Would make recommendations for treatment regarding this depending on the arteriogram.  I did explain that there is an outside chance that this would be appropriately treated with angioplasty and stenting but more likely that this would require left femoral to popliteal bypass for treatment.  She understands and wishes further evaluation.  This will be coordinated with one of my partners in [place] at her convenience ? ? ?Rosetta Posner, MD FACS ?[facility] [place] ?Office Tel [phone] ?Pager [phone] ? ?Note: Portions of this  report may have been transcribed using voice recognition software.  Every effort has been made to ensure accuracy; however, inadvertent computerized transcription errors may still be present. ? ?

## 2021-06-28 NOTE — Progress Notes (Signed)
? ? ?[facility] [place] ? ?Patient name: Kimberly Donaldson MRN: [id number] DOB: Aug 10, 1955 Sex: female ? ?REASON FOR CONSULT: Evaluation left leg claudication ? ?HPI: ?Kimberly Donaldson is a 66 y.o. female, who is here today for evaluation of left leg claudication.  She has a long term diabetic and cigarette smoker.  She reports that for the approximately 1 year she is having increasing severe left leg claudication.  This is very limiting to her ability to do her daily activities and also play with her grandchildren.  She is otherwise active.  She denies any cardiac difficulties. ? ?Past Medical History:  ?Diagnosis Date  ? Diabetes mellitus without complication ([place])   ? Hypertension   ? Retina disorder, bilateral   ? ? ?Family History  ?Problem Relation Age of Onset  ? Cancer Mother   ? Liver cancer Mother   ? Colon polyps Father   ? Cancer Father   ? Diabetes Father   ? Bladder Cancer Father   ? Colon cancer Neg Hx   ? Esophageal cancer Neg Hx   ? Pancreatic cancer Neg Hx   ? Stomach cancer Neg Hx   ? Breast cancer Neg Hx   ? ? ?SOCIAL HISTORY: ?Social History  ? ?Socioeconomic History  ? Marital status: Widowed  ?  Spouse name: Not on file  ? Number of children: Not on file  ? Years of education: Not on file  ? Highest education level: Not on file  ?Occupational History  ? Not on file  ?Tobacco Use  ? Smoking status: Every Day  ?  Packs/day: 1.00  ?  Types: Cigarettes  ? Smokeless tobacco: Never  ?Vaping Use  ? Vaping Use: Never used  ?Substance and Sexual Activity  ? Alcohol use: Yes  ?  Comment: occ  ? Drug use: Yes  ?  Types: Marijuana  ?  Comment: gummies every 2-3 months  ? Sexual activity: Not Currently  ?  Birth control/protection: Post-menopausal  ?Other Topics Concern  ? Not on file  ?Social History Narrative  ? Not on file  ? ?Social Determinants of Health  ? ?Financial Resource Strain: Low Risk   ? Difficulty of Paying Living Expenses: Not very hard   ?Food Insecurity: No Food Insecurity  ? Worried About Charity fundraiser in the Last Year: Never true  ? Ran Out of Food in the Last Year: Never true  ?Transportation Needs: No Transportation Needs  ? Lack of Transportation (Medical): No  ? Lack of Transportation (Non-Medical): No  ?Physical Activity: Inactive  ? Days of Exercise per Week: 0 days  ? Minutes of Exercise per Session: 0 min  ?Stress: No Stress Concern Present  ? Feeling of Stress : Only a little  ?Social Connections: Moderately Integrated  ? Frequency of Communication with Friends and Family: More than three times a week  ? Frequency of Social Gatherings with Friends and Family: Once a week  ? Attends Religious Services: More than 4 times per year  ? Active Member of Clubs or Organizations: Yes  ? Attends Archivist Meetings: More than 4 times per year  ? Marital Status: Widowed  ?Intimate Partner Violence: Not At Risk  ? Fear of Current or Ex-Partner: No  ? Emotionally Abused: No  ? Physically Abused: No  ? Sexually Abused: No  ? ? ?Allergies  ?Allergen Reactions  ? Codeine   ?  REACTION: ringing in ears  ? Morphine   ?  REACTION: hives  ? Other   ?  sporinof - for nail fungus  ? ? ?Current Outpatient Medications  ?Medication Sig Dispense Refill  ? amLODipine (NORVASC) 5 MG tablet Take 1 tablet (5 mg total) by mouth daily. 90 tablet 3  ? aspirin EC 81 MG tablet Take 81 mg by mouth daily. Swallow whole.    ? baclofen (LIORESAL) 10 MG tablet Take 1 tablet (10 mg total) by mouth 3 (three) times daily. 30 each 0  ? cephALEXin (KEFLEX) 500 MG capsule Take 1 capsule (500 mg total) by mouth 4 (four) times daily for 10 days. 40 capsule 0  ? ibuprofen (ADVIL,MOTRIN) 200 MG tablet Take 800 mg by mouth every 6 (six) hours as needed for moderate pain.    ? lisinopril (ZESTRIL) 20 MG tablet Take 2 tablets (40 mg total) by mouth daily. 180 tablet 3  ? loperamide (IMODIUM) 2 MG capsule Take by mouth as needed for diarrhea or loose stools.    ? Multiple  Vitamins-Minerals (AIRBORNE PO) Take 1 tablet by mouth daily.    ? Omega-3 Fatty Acids (FISH OIL PO) Take 1 tablet by mouth daily.    ? rosuvastatin (CRESTOR) 5 MG tablet Take 1 tablet (5 mg total) by mouth daily. 90 tablet 3  ? Semaglutide,0.25 or 0.'5MG'$ /DOS, (OZEMPIC, 0.25 OR 0.5 MG/DOSE,) 2 MG/1.5ML SOPN Inject 0.5 mg into the skin once a week. 4.5 mL 3  ? silver sulfADIAZINE (SSD) 1 % cream Apply 1 application. topically daily. 50 g 0  ? tobramycin (TOBREX) 0.3 % ophthalmic solution 1 drop 4 (four) times daily.    ? ?No current facility-administered medications for this visit.  ? ? ?REVIEW OF SYSTEMS:  ?'[X]'$  denotes positive finding, '[ ]'$  denotes negative finding ?Cardiac  Comments:  ?Chest pain or chest pressure:    ?Shortness of breath upon exertion:    ?Short of breath when lying flat:    ?Irregular heart rhythm:    ?    ?Vascular    ?Pain in calf, thigh, or hip brought on by ambulation: x   ?Pain in feet at night that wakes you up from your sleep:     ?Blood clot in your veins:    ?Leg swelling:     ?    ?Pulmonary    ?Oxygen at home:    ?Productive cough:     ?Wheezing:     ?    ?Neurologic    ?Sudden weakness in arms or legs:     ?Sudden numbness in arms or legs:     ?Sudden onset of difficulty speaking or slurred speech:    ?Temporary loss of vision in one eye:     ?Problems with dizziness:     ?    ?Gastrointestinal    ?Blood in stool:     ?Vomited blood:     ?    ?Genitourinary    ?Burning when urinating:     ?Blood in urine:    ?    ?Psychiatric    ?Major depression:     ?    ?Hematologic    ?Bleeding problems:    ?Problems with blood clotting too easily:    ?    ?Skin    ?Rashes or ulcers:    ?    ?Constitutional    ?Fever or chills:    ? ? ?PHYSICAL EXAM: ?Vitals:  ? 06/28/21 1528  ?BP: (!) 175/69  ?Pulse: 65  ?Resp: 18  ?Temp: 98.4 ?F (36.9 ?C)  ?  TempSrc: Temporal  ?SpO2: 96%  ?Weight: 191 lb 3.2 oz (86.7 kg)  ?Height: '5\' 4"'$  (1.626 m)  ? ? ?GENERAL: The patient is a well-nourished female, in no acute  distress. The vital signs are documented above. ?CARDIOVASCULAR: 2+ radial and 2+ femoral pulses bilaterally.  2+ right popliteal and dorsalis pedis pulse.  Absent left popliteal and pedal pulses ?PULMONARY: There is good air exchange  ?MUSCULOSKELETAL: There are no major deformities or cyanosis. ?NEUROLOGIC: No focal weakness or paresthesias are detected. ?SKIN: She does have an open ulcer above her Achilles on the left with approximately the tissue centimeter diameter and superficial ?PSYCHIATRIC: The patient has a normal affect. ? ?DATA:  ?Noninvasive studies arrives today reveal triphasic waveforms and ankle arm index of 0.92 on the right.  On the left she has monophasic flow with an ankle arm index of 0.44 ? ?MEDICAL ISSUES: ?Limiting claudication left leg.  I did explain that this is probably not limb threatening.  Her ulceration has been very slow to heal but appears to be more typical of venous disease.  I did explain that limited arterial flow will make this much more difficult to heal.  She reports that she is unable to tolerate this level of claudication and wishes further treatment.  I did explain that this is directly related to cigarette smoking and is just ongoing need to localize.  I did explain also the neck step would be arteriography.  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  Would make recommendations for treatment regarding this depending on the arteriogram.  I did explain that there is an outside chance that this would be appropriately treated with angioplasty and stenting but more likely that this would require left femoral to popliteal bypass for treatment.  She understands and wishes further evaluation.  This will be coordinated with one of my partners in [place] at her convenience ? ? ?Rosetta Posner, MD FACS ?[facility] [place] ?Office Tel [phone] ?Pager [phone] ? ?Note: Portions of this  report may have been transcribed using voice recognition software.  Every effort has been made to ensure accuracy; however, inadvertent computerized transcription errors may still be present. ? ?

## 2021-06-28 NOTE — H&P (View-Only) (Signed)
? ? ?[facility] [place] ? ?Patient name: Kimberly Donaldson MRN: [id number] DOB: Aug 30, 1955 Sex: female ? ?REASON FOR CONSULT: Evaluation left leg claudication ? ?HPI: ?Kimberly Donaldson is a 66 y.o. female, who is here today for evaluation of left leg claudication.  She has a long term diabetic and cigarette smoker.  She reports that for the approximately 1 year she is having increasing severe left leg claudication.  This is very limiting to her ability to do her daily activities and also play with her grandchildren.  She is otherwise active.  She denies any cardiac difficulties. ? ?Past Medical History:  ?Diagnosis Date  ? Diabetes mellitus without complication ([place])   ? Hypertension   ? Retina disorder, bilateral   ? ? ?Family History  ?Problem Relation Age of Onset  ? Cancer Mother   ? Liver cancer Mother   ? Colon polyps Father   ? Cancer Father   ? Diabetes Father   ? Bladder Cancer Father   ? Colon cancer Neg Hx   ? Esophageal cancer Neg Hx   ? Pancreatic cancer Neg Hx   ? Stomach cancer Neg Hx   ? Breast cancer Neg Hx   ? ? ?SOCIAL HISTORY: ?Social History  ? ?Socioeconomic History  ? Marital status: Widowed  ?  Spouse name: Not on file  ? Number of children: Not on file  ? Years of education: Not on file  ? Highest education level: Not on file  ?Occupational History  ? Not on file  ?Tobacco Use  ? Smoking status: Every Day  ?  Packs/day: 1.00  ?  Types: Cigarettes  ? Smokeless tobacco: Never  ?Vaping Use  ? Vaping Use: Never used  ?Substance and Sexual Activity  ? Alcohol use: Yes  ?  Comment: occ  ? Drug use: Yes  ?  Types: Marijuana  ?  Comment: gummies every 2-3 months  ? Sexual activity: Not Currently  ?  Birth control/protection: Post-menopausal  ?Other Topics Concern  ? Not on file  ?Social History Narrative  ? Not on file  ? ?Social Determinants of Health  ? ?Financial Resource Strain: Low Risk   ? Difficulty of Paying Living Expenses: Not very hard   ?Food Insecurity: No Food Insecurity  ? Worried About Charity fundraiser in the Last Year: Never true  ? Ran Out of Food in the Last Year: Never true  ?Transportation Needs: No Transportation Needs  ? Lack of Transportation (Medical): No  ? Lack of Transportation (Non-Medical): No  ?Physical Activity: Inactive  ? Days of Exercise per Week: 0 days  ? Minutes of Exercise per Session: 0 min  ?Stress: No Stress Concern Present  ? Feeling of Stress : Only a little  ?Social Connections: Moderately Integrated  ? Frequency of Communication with Friends and Family: More than three times a week  ? Frequency of Social Gatherings with Friends and Family: Once a week  ? Attends Religious Services: More than 4 times per year  ? Active Member of Clubs or Organizations: Yes  ? Attends Archivist Meetings: More than 4 times per year  ? Marital Status: Widowed  ?Intimate Partner Violence: Not At Risk  ? Fear of Current or Ex-Partner: No  ? Emotionally Abused: No  ? Physically Abused: No  ? Sexually Abused: No  ? ? ?Allergies  ?Allergen Reactions  ? Codeine   ?  REACTION: ringing in ears  ? Morphine   ?  REACTION: hives  ? Other   ?  sporinof - for nail fungus  ? ? ?Current Outpatient Medications  ?Medication Sig Dispense Refill  ? amLODipine (NORVASC) 5 MG tablet Take 1 tablet (5 mg total) by mouth daily. 90 tablet 3  ? aspirin EC 81 MG tablet Take 81 mg by mouth daily. Swallow whole.    ? baclofen (LIORESAL) 10 MG tablet Take 1 tablet (10 mg total) by mouth 3 (three) times daily. 30 each 0  ? cephALEXin (KEFLEX) 500 MG capsule Take 1 capsule (500 mg total) by mouth 4 (four) times daily for 10 days. 40 capsule 0  ? ibuprofen (ADVIL,MOTRIN) 200 MG tablet Take 800 mg by mouth every 6 (six) hours as needed for moderate pain.    ? lisinopril (ZESTRIL) 20 MG tablet Take 2 tablets (40 mg total) by mouth daily. 180 tablet 3  ? loperamide (IMODIUM) 2 MG capsule Take by mouth as needed for diarrhea or loose stools.    ? Multiple  Vitamins-Minerals (AIRBORNE PO) Take 1 tablet by mouth daily.    ? Omega-3 Fatty Acids (FISH OIL PO) Take 1 tablet by mouth daily.    ? rosuvastatin (CRESTOR) 5 MG tablet Take 1 tablet (5 mg total) by mouth daily. 90 tablet 3  ? Semaglutide,0.25 or 0.'5MG'$ /DOS, (OZEMPIC, 0.25 OR 0.5 MG/DOSE,) 2 MG/1.5ML SOPN Inject 0.5 mg into the skin once a week. 4.5 mL 3  ? silver sulfADIAZINE (SSD) 1 % cream Apply 1 application. topically daily. 50 g 0  ? tobramycin (TOBREX) 0.3 % ophthalmic solution 1 drop 4 (four) times daily.    ? ?No current facility-administered medications for this visit.  ? ? ?REVIEW OF SYSTEMS:  ?'[X]'$  denotes positive finding, '[ ]'$  denotes negative finding ?Cardiac  Comments:  ?Chest pain or chest pressure:    ?Shortness of breath upon exertion:    ?Short of breath when lying flat:    ?Irregular heart rhythm:    ?    ?Vascular    ?Pain in calf, thigh, or hip brought on by ambulation: x   ?Pain in feet at night that wakes you up from your sleep:     ?Blood clot in your veins:    ?Leg swelling:     ?    ?Pulmonary    ?Oxygen at home:    ?Productive cough:     ?Wheezing:     ?    ?Neurologic    ?Sudden weakness in arms or legs:     ?Sudden numbness in arms or legs:     ?Sudden onset of difficulty speaking or slurred speech:    ?Temporary loss of vision in one eye:     ?Problems with dizziness:     ?    ?Gastrointestinal    ?Blood in stool:     ?Vomited blood:     ?    ?Genitourinary    ?Burning when urinating:     ?Blood in urine:    ?    ?Psychiatric    ?Major depression:     ?    ?Hematologic    ?Bleeding problems:    ?Problems with blood clotting too easily:    ?    ?Skin    ?Rashes or ulcers:    ?    ?Constitutional    ?Fever or chills:    ? ? ?PHYSICAL EXAM: ?Vitals:  ? 06/28/21 1528  ?BP: (!) 175/69  ?Pulse: 65  ?Resp: 18  ?Temp: 98.4 ?F (36.9 ?C)  ?  TempSrc: Temporal  ?SpO2: 96%  ?Weight: 191 lb 3.2 oz (86.7 kg)  ?Height: '5\' 4"'$  (1.626 m)  ? ? ?GENERAL: The patient is a well-nourished female, in no acute  distress. The vital signs are documented above. ?CARDIOVASCULAR: 2+ radial and 2+ femoral pulses bilaterally.  2+ right popliteal and dorsalis pedis pulse.  Absent left popliteal and pedal pulses ?PULMONARY: There is good air exchange  ?MUSCULOSKELETAL: There are no major deformities or cyanosis. ?NEUROLOGIC: No focal weakness or paresthesias are detected. ?SKIN: She does have an open ulcer above her Achilles on the left with approximately the tissue centimeter diameter and superficial ?PSYCHIATRIC: The patient has a normal affect. ? ?DATA:  ?Noninvasive studies arrives today reveal triphasic waveforms and ankle arm index of 0.92 on the right.  On the left she has monophasic flow with an ankle arm index of 0.44 ? ?MEDICAL ISSUES: ?Limiting claudication left leg.  I did explain that this is probably not limb threatening.  Her ulceration has been very slow to heal but appears to be more typical of venous disease.  I did explain that limited arterial flow will make this much more difficult to heal.  She reports that she is unable to tolerate this level of claudication and wishes further treatment.  I did explain that this is directly related to cigarette smoking and is just ongoing need to localize.  I did explain also the neck step would be arteriography.  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  Would make recommendations for treatment regarding this depending on the arteriogram.  I did explain that there is an outside chance that this would be appropriately treated with angioplasty and stenting but more likely that this would require left femoral to popliteal bypass for treatment.  She understands and wishes further evaluation.  This will be coordinated with one of my partners in [place] at her convenience ? ? ?Rosetta Posner, MD FACS ?[facility] ?Office Tel [phone] ?Pager [phone] ? ?Note: Portions of this  report may have been transcribed using voice recognition software.  Every effort has been made to ensure accuracy; however, inadvertent computerized transcription errors may still be present. ? ?

## 2021-06-29 ENCOUNTER — Other Ambulatory Visit: Payer: Self-pay

## 2021-07-07 ENCOUNTER — Encounter (HOSPITAL_COMMUNITY)
Admission: RE | Disposition: A | Discharge status: Home / Self Care | Payer: Self-pay | Source: Home / Self Care | Attending: Vascular Surgery

## 2021-07-07 ENCOUNTER — Ambulatory Visit (HOSPITAL_BASED_OUTPATIENT_CLINIC_OR_DEPARTMENT_OTHER): Payer: Medicare HMO

## 2021-07-07 ENCOUNTER — Ambulatory Visit (HOSPITAL_COMMUNITY)
Admission: RE | Admit: 2021-07-07 | Discharge: 2021-07-07 | Disposition: A | Discharge status: Home / Self Care | Payer: Medicare HMO | Attending: Vascular Surgery | Admitting: Vascular Surgery

## 2021-07-07 ENCOUNTER — Other Ambulatory Visit: Payer: Self-pay

## 2021-07-07 ENCOUNTER — Ambulatory Visit (INDEPENDENT_AMBULATORY_CARE_PROVIDER_SITE_OTHER): Payer: Medicare HMO | Admitting: Family Medicine

## 2021-07-07 ENCOUNTER — Encounter (HOSPITAL_COMMUNITY): Payer: Self-pay | Admitting: Vascular Surgery

## 2021-07-07 VITALS — BP 137/58 | HR 67 | Ht 64.0 in | Wt 191.0 lb

## 2021-07-07 DIAGNOSIS — Z716 Tobacco abuse counseling: Secondary | ICD-10-CM

## 2021-07-07 DIAGNOSIS — I10 Essential (primary) hypertension: Secondary | ICD-10-CM | POA: Insufficient documentation

## 2021-07-07 DIAGNOSIS — L97229 Non-pressure chronic ulcer of left calf with unspecified severity: Secondary | ICD-10-CM | POA: Insufficient documentation

## 2021-07-07 DIAGNOSIS — L98499 Non-pressure chronic ulcer of skin of other sites with unspecified severity: Secondary | ICD-10-CM | POA: Diagnosis not present

## 2021-07-07 DIAGNOSIS — Z01810 Encounter for preprocedural cardiovascular examination: Secondary | ICD-10-CM

## 2021-07-07 DIAGNOSIS — I70242 Atherosclerosis of native arteries of left leg with ulceration of calf: Secondary | ICD-10-CM | POA: Diagnosis not present

## 2021-07-07 DIAGNOSIS — I70212 Atherosclerosis of native arteries of extremities with intermittent claudication, left leg: Secondary | ICD-10-CM | POA: Diagnosis not present

## 2021-07-07 DIAGNOSIS — F17210 Nicotine dependence, cigarettes, uncomplicated: Secondary | ICD-10-CM | POA: Diagnosis not present

## 2021-07-07 DIAGNOSIS — R69 Illness, unspecified: Secondary | ICD-10-CM | POA: Diagnosis not present

## 2021-07-07 DIAGNOSIS — I739 Peripheral vascular disease, unspecified: Secondary | ICD-10-CM | POA: Diagnosis not present

## 2021-07-07 DIAGNOSIS — I771 Stricture of artery: Secondary | ICD-10-CM

## 2021-07-07 DIAGNOSIS — E1151 Type 2 diabetes mellitus with diabetic peripheral angiopathy without gangrene: Secondary | ICD-10-CM | POA: Insufficient documentation

## 2021-07-07 HISTORY — PX: ABDOMINAL AORTOGRAM W/LOWER EXTREMITY: CATH118223

## 2021-07-07 LAB — POCT I-STAT, CHEM 8
BUN: 19 mg/dL (ref 8–23)
Calcium, Ion: 1.26 mmol/L (ref 1.15–1.40)
Chloride: 105 mmol/L (ref 98–111)
Creatinine, Ser: 0.7 mg/dL (ref 0.44–1.00)
Glucose, Bld: 109 mg/dL — ABNORMAL HIGH (ref 70–99)
HCT: 43 % (ref 36.0–46.0)
Hemoglobin: 14.6 g/dL (ref 12.0–15.0)
Potassium: 4.1 mmol/L (ref 3.5–5.1)
Sodium: 142 mmol/L (ref 135–145)
TCO2: 27 mmol/L (ref 22–32)

## 2021-07-07 LAB — GLUCOSE, CAPILLARY
Glucose-Capillary: 108 mg/dL — ABNORMAL HIGH (ref 70–99)
Glucose-Capillary: 105 mg/dL — ABNORMAL HIGH (ref 70–99)

## 2021-07-07 SURGERY — ABDOMINAL AORTOGRAM W/LOWER EXTREMITY
Anesthesia: LOCAL

## 2021-07-07 MED ORDER — SODIUM CHLORIDE 0.9% FLUSH: 3.0000 mL | INTRAVENOUS | Status: DC | PRN

## 2021-07-07 MED ORDER — SODIUM CHLORIDE 0.9 % WEIGHT BASED INFUSION: 1.0000 mL/kg/h | INTRAVENOUS | Status: DC

## 2021-07-07 MED ORDER — HYDRALAZINE HCL 20 MG/ML IJ SOLN: 5.0000 mg | INTRAMUSCULAR | Status: DC | PRN

## 2021-07-07 MED ORDER — ACETAMINOPHEN 325 MG PO TABS: 650.0000 mg | ORAL_TABLET | ORAL | Status: DC | PRN

## 2021-07-07 MED ORDER — SODIUM CHLORIDE 0.9 % IV SOLN: INTRAVENOUS | Status: DC

## 2021-07-07 MED ORDER — FENTANYL CITRATE (PF) 100 MCG/2ML IJ SOLN
INTRAMUSCULAR | Status: AC
  Filled 2021-07-07: qty 2

## 2021-07-07 MED ORDER — ONDANSETRON HCL 4 MG/2ML IJ SOLN: 4.0000 mg | Freq: Four times a day (QID) | INTRAMUSCULAR | Status: DC | PRN

## 2021-07-07 MED ORDER — MIDAZOLAM HCL 2 MG/2ML IJ SOLN
INTRAMUSCULAR | Status: AC
  Filled 2021-07-07: qty 2

## 2021-07-07 MED ORDER — HEPARIN (PORCINE) IN NACL 1000-0.9 UT/500ML-% IV SOLN
INTRAVENOUS | Status: DC | PRN
  Administered 2021-07-07 (×2): 500 mL

## 2021-07-07 MED ORDER — HYDRALAZINE HCL 20 MG/ML IJ SOLN
INTRAMUSCULAR | Status: AC
  Filled 2021-07-07: qty 1

## 2021-07-07 MED ORDER — HYDRALAZINE HCL 20 MG/ML IJ SOLN
INTRAMUSCULAR | Status: DC | PRN
  Administered 2021-07-07: 10 mg via INTRAVENOUS

## 2021-07-07 MED ORDER — LIDOCAINE HCL (PF) 1 % IJ SOLN
INTRAMUSCULAR | Status: DC | PRN
  Administered 2021-07-07: 12 mL

## 2021-07-07 MED ORDER — LIDOCAINE HCL (PF) 1 % IJ SOLN
INTRAMUSCULAR | Status: AC
  Filled 2021-07-07: qty 30

## 2021-07-07 MED ORDER — LABETALOL HCL 5 MG/ML IV SOLN: 10.0000 mg | INTRAVENOUS | Status: DC | PRN

## 2021-07-07 MED ORDER — SODIUM CHLORIDE 0.9 % IV SOLN: 250.0000 mL | INTRAVENOUS | Status: DC | PRN

## 2021-07-07 MED ORDER — HEPARIN (PORCINE) IN NACL 1000-0.9 UT/500ML-% IV SOLN
INTRAVENOUS | Status: AC
  Filled 2021-07-07: qty 1000

## 2021-07-07 MED ORDER — MIDAZOLAM HCL 2 MG/2ML IJ SOLN
INTRAMUSCULAR | Status: DC | PRN
  Administered 2021-07-07: 1 mg via INTRAVENOUS

## 2021-07-07 MED ORDER — BUPROPION HCL ER (XL) 150 MG PO TB24: 150.0000 mg | ORAL_TABLET | Freq: Every day | ORAL | 2 refills | Status: DC

## 2021-07-07 MED ORDER — FENTANYL CITRATE (PF) 100 MCG/2ML IJ SOLN
INTRAMUSCULAR | Status: DC | PRN
Start: 2021-07-07 — End: 2021-07-07
  Administered 2021-07-07: 50 ug via INTRAVENOUS

## 2021-07-07 MED ORDER — IODIXANOL 320 MG/ML IV SOLN
INTRAVENOUS | Status: DC | PRN
  Administered 2021-07-07: 110 mL

## 2021-07-07 MED ORDER — SODIUM CHLORIDE 0.9% FLUSH: 3.0000 mL | Freq: Two times a day (BID) | INTRAVENOUS | Status: DC

## 2021-07-07 SURGICAL SUPPLY — 13 items
CATH ANGIO 5F PIGTAIL 65CM (CATHETERS) ×1 IMPLANT
CATH CROSS OVER TEMPO 5F (CATHETERS) ×1 IMPLANT
CATH STRAIGHT 5FR 65CM (CATHETERS) ×1 IMPLANT
CLOSURE MYNX CONTROL 5F (Vascular Products) ×1 IMPLANT
KIT MICROPUNCTURE NIT STIFF (SHEATH) ×1 IMPLANT
KIT PV (KITS) ×3 IMPLANT
MAT PREVALON FULL STRYKER (MISCELLANEOUS) ×1 IMPLANT
SHEATH PINNACLE 5F 10CM (SHEATH) ×1 IMPLANT
SHEATH PROBE COVER 6X72 (BAG) ×1 IMPLANT
SYR MEDRAD MARK V 150ML (SYRINGE) ×1 IMPLANT
TRANSDUCER W/STOPCOCK (MISCELLANEOUS) ×3 IMPLANT
TRAY PV CATH (CUSTOM PROCEDURE TRAY) ×3 IMPLANT
WIRE HITORQ VERSACORE ST 145CM (WIRE) ×1 IMPLANT

## 2021-07-07 NOTE — Progress Notes (Signed)
C/O left lower leg stinging pain; client up to bathroom and states relief from leg pain; refused pain medication ?

## 2021-07-07 NOTE — Progress Notes (Signed)
? ?BP (!) 137/58   Pulse 67   Ht '5\' 4"'$  (1.626 m)   Wt 191 lb (86.6 kg)   SpO2 99%   BMI 32.79 kg/m?   ? ?Subjective:  ? ?Patient ID: Kimberly Donaldson, female    DOB: 17-Aug-1955, 66 y.o.   MRN: [id number] ? ?HPI: ?Kimberly Donaldson is a 66 y.o. female presenting on 07/07/2021 for Wound Check (LLE- present since March) ? ? 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  She is going to a vascular doctor and going for bypass in a couple weeks.  She is having a lot of pain with this.  She does have Silvadene cream and has been using it. ? ?Smoking cessation ?Patient is coming in for smoking cessation counseling-says she gets over anxiety right now wants something to help with both, we discussed options and she wants to try Wellbutrin because it could help with both.  Denies any suicidal ideations or thoughts to hurt herself.  She is already trying to reduce because arterial insufficiency on the cigarettes. ? ?Relevant past medical, surgical, family and social history reviewed and updated as indicated. Interim medical history since our last visit reviewed. ?Allergies and medications reviewed and updated. ? ?Review of Systems  ?Constitutional:  Negative for chills and fever.  ?Eyes:  Negative for visual disturbance.  ?Respiratory:  Negative for chest tightness and shortness of breath.   ?Cardiovascular:  Negative for chest pain and leg swelling.  ?Skin:  Positive for color change and wound. Negative for rash.  ?Neurological:  Negative for light-headedness and headaches.  ?Psychiatric/Behavioral:  Positive for dysphoric mood and sleep disturbance. Negative for agitation, behavioral problems and decreased concentration. The patient is nervous/anxious.   ?All other systems reviewed and are negative. ? ?Per HPI unless specifically indicated above ? ? ?Allergies as of 07/07/2021   ? ?   Reactions  ? Codeine   ? Ringing in ears  ?  Morphine Hives  ? Other Hives  ? Sporanox - for nail fungus  ? ?  ? ?  ?Medication List  ?  ? ?  ? Accurate as of July 07, 2021  1:23 PM. If you have any questions, ask your nurse or doctor.  ?  ?  ? ?  ? ?AIRBORNE PO ?Take 1 tablet by mouth daily. ?  ?amLODipine 5 MG tablet ?Commonly known as: NORVASC ?Take 1 tablet (5 mg total) by mouth daily. ?  ?aspirin EC 81 MG tablet ?Take 81 mg by mouth daily. Swallow whole. ?  ?baclofen 10 MG tablet ?Commonly known as: LIORESAL ?Take 1 tablet (10 mg total) by mouth 3 (three) times daily. ?What changed:  ?when to take this ?reasons to take this ?  ?buPROPion 150 MG 24 hr tablet ?Commonly known as: Wellbutrin XL ?Take 1 tablet (150 mg total) by mouth daily. ?  ?Fish Oil 1000 MG Caps ?Take 1,000 mg by mouth daily. ?  ?ibuprofen 200 MG tablet ?Commonly known as: ADVIL ?Take 800 mg by mouth every 6 (six) hours as needed for moderate pain. ?  ?lisinopril 20 MG tablet ?Commonly known as: ZESTRIL ?Take 2 tablets (40 mg total) by mouth daily. ?  ?loperamide 2 MG capsule ?Commonly known as: IMODIUM ?Take 2 mg by mouth as needed for diarrhea or loose stools. ?  ?Ozempic (0.25 or 0.5 MG/DOSE) 2 MG/1.5ML Sopn ?Generic drug: Semaglutide(0.25 or 0.'5MG'$ /DOS) ?Inject 0.5  mg into the skin once a week. ?What changed: when to take this ?  ?rosuvastatin 5 MG tablet ?Commonly known as: Crestor ?Take 1 tablet (5 mg total) by mouth daily. ?  ?silver sulfADIAZINE 1 % cream ?Commonly known as: SSD ?Apply 1 application. topically daily. ?  ?tobramycin 0.3 % ophthalmic solution ?Commonly known as: TOBREX ?Place 1 drop into both eyes See admin instructions. Used before eye injections every 3 months ?  ? ?  ? ? ? ?Objective:  ? ?BP (!) 137/58   Pulse 67   Ht '5\' 4"'$  (1.626 m)   Wt 191 lb (86.6 kg)   SpO2 99%   BMI 32.79 kg/m?   ?Wt Readings from Last 3 Encounters:  ?07/07/21 191 lb (86.6 kg)  ?07/07/21 190 lb (86.2 kg)  ?06/28/21 191 lb 3.2 oz (86.7 kg)  ?  ?Physical Exam ?Vitals and nursing note  reviewed.  ?Constitutional:   ?   Appearance: Normal appearance.  ?Skin: ?   Findings: Wound present.  ? ?    ?Neurological:  ?   Mental Status: She is alert.  ? ? ? ? ?Assessment & Plan:  ? ?Problem List Items Addressed This Visit   ?None ?Visit Diagnoses   ? ? Encounter for smoking cessation counseling    -  Primary  ? Relevant Medications  ? buPROPion (WELLBUTRIN XL) 150 MG 24 hr tablet  ? Arterial insufficiency with ischemic ulcer ([place])      ? ?  ?  ?We will try Wellbutrin for anxiety and smoking cessation, applied wet-to-dry dressing using Xeroform gauze and roll gauze and Coban and recommended to change daily, return in 2 weeks ?Follow up plan: ?Return in about 2 weeks (around 07/21/2021), or if symptoms worsen or fail to improve, for Wound recheck. ? ?Counseling provided for all of the vaccine components ?No orders of the defined types were placed in this encounter. ? ? ?Caryl Pina, MD ?[facility] ?07/07/2021, 1:23 PM ? ? ? ? ?

## 2021-07-07 NOTE — Op Note (Signed)
? ?PATIENT: Kimberly Donaldson      MRN: [id number] ?DOB: 1956/01/13    DATE OF PROCEDURE: 07/07/2021 ? ?INDICATIONS:   ? ?Kimberly Donaldson is a 66 y.o. female who presented with claudication of the left lower extremity and an ulcer on her posterior calf which is felt to potentially be related to venous disease.  She was set up for arteriography.  She has diabetes and hypertension.  She smokes 1 pack/day.  She is on aspirin and is on a statin. ? ?PROCEDURE:   ? ?Ultrasound-guided access to the right common femoral artery ?Aortogram with bilateral iliac arteriogram ?Selective catheterization of the left external iliac artery with left lower extremity runoff (second-order catheterization) ?Retrograde right femoral arteriogram ?Mynx closure of right common femoral artery ? ?SURGEON: Judeth Cornfield. Scot Dock, MD, FACS ? ?ANESTHESIA: Local with sedation ? ?EBL: Minimal ? ?TECHNIQUE: The patient was brought to the peripheral vascular lab and was sedated. The period of conscious sedation was 38 minutes.  During that time period, I was present face-to-face 100% of the time.  The patient was administered 1 mg of Versed and 50 mcg of fentanyl.. The patient's heart rate, blood pressure, and oxygen saturation were monitored by the nurse continuously during the procedure. ? ?Both groins were prepped and draped in the usual sterile fashion.  Under ultrasound guidance, after the skin was anesthetized, I cannulated the right common femoral artery with a micropuncture needle and a micropuncture sheath was introduced over a wire.  This was exchanged for a 5 [place] sheath over a Bentson wire.  By ultrasound the femoral artery was patent. A real-time image was obtained and sent to the server. ? ?The pigtail catheter was positioned at the L1 vertebral body and flush aortogram obtained.  The catheter was in position above the aortic bifurcation and an oblique iliac projection was obtained.  Next I exchanged the pigtail catheter for a crossover  catheter which was positioned into the left common iliac artery.  The wire was advanced into the external iliac artery and the crossover catheter exchanged for a straight catheter.  Selective left external iliac arteriogram was obtained with left lower extremity runoff.  Next this catheter was removed and a retrograde right femoral arteriogram was obtained.  At the completion of the procedure after the sheath was flushed the right common femoral artery was closed with the Mynx device with no immediate complications noted.  The patient was transferred to the holding area. ? ?FINDINGS:  ? ?Single renal artery on the right with no significant renal artery stenosis.  There is an accessory renal artery on the left.  There is no significant renal artery stenosis on the left. ?The infrarenal aorta, bilateral common iliac arteries, external iliac arteries, and hypogastric arteries are patent. ?On the left side, which is the side of concern, the common femoral and deep femoral artery are patent.  There is mild diffuse disease in the deep femoral artery.  The superficial femoral artery is occluded at its origin with reconstitution of the above-knee popliteal artery.  There is moderate disease throughout the above-knee popliteal artery.  The below-knee popliteal artery is patent.  There is three-vessel runoff on the left via the anterior tibial, posterior tibial, and peroneal arteries.  There is some mild diffuse disease throughout the posterior tibial artery and anterior tibial artery.  The dorsalis pedis is patent on the left. ?On the right side, the common femoral, deep femoral, and superficial femoral artery are patent.  There is a minimal  calcific plaque in the mid thigh.  There is three-vessel runoff on the left via the anterior tibial, posterior tibial, and peroneal arteries.  The peroneal artery occludes in the mid calf.  There is a stenosis in the proximal tibial peroneal trunk on the right.  There are some mild  diffuse disease throughout the anterior tibial artery and minimal disease in the posterior tibial artery. ? ? ? ? ? ? ?CLINICAL NOTE: Given that the patient has a wound on her left calf which may be related to venous disease I think she would benefit best from femoral to below-knee popliteal artery bypass grafting.  I would try to get her vein mapped while she is here.  I will also discussed this with her daughter.  If she is agreeable we will try to schedule this in the near future.  She has no significant cardiac history so I do not think she would require preoperative cardiac work-up.  She is on aspirin and is on a statin.  I have also discussed with her the importance of tobacco cessation. ? ?Deitra Mayo, MD, FACS ?[facility] ? ?DATE OF DICTATION:   07/07/2021 ? ? ?

## 2021-07-07 NOTE — Patient Instructions (Signed)
Change dressing daily, use Silvadene cream and then apply Xeroform gauze and then wrap ?

## 2021-07-07 NOTE — Interval H&P Note (Signed)
History and Physical Interval Note: ? ?07/07/2021 ?7:26 AM ? ?Kimberly Donaldson  has presented today for surgery, with the diagnosis of PAD.  The various methods of treatment have been discussed with the patient and family. After consideration of risks, benefits and other options for treatment, the patient has consented to  Procedure(s): ?ABDOMINAL AORTOGRAM W/LOWER EXTREMITY (N/A) as a surgical intervention.  The patient's history has been reviewed, patient examined, no change in status, stable for surgery.  I have reviewed the patient's chart and labs.  Questions were answered to the patient's satisfaction.   ? ? ?Deitra Mayo ? ? ?

## 2021-07-10 ENCOUNTER — Other Ambulatory Visit: Payer: Self-pay

## 2021-07-10 ENCOUNTER — Ambulatory Visit: Payer: Medicare HMO | Admitting: Family Medicine

## 2021-07-10 DIAGNOSIS — I739 Peripheral vascular disease, unspecified: Secondary | ICD-10-CM

## 2021-07-11 NOTE — Pre-Procedure Instructions (Signed)
Surgical Instructions ? ? ? Your procedure is scheduled on Tuesday, May 2nd. ? Report to [facility] Main Entrance "A" at 5:30 A.M., then check in with the Admitting office. ? Call this number if you have problems the morning of surgery: ? [phone] ? ? If you have any questions prior to your surgery date call [phone]: Open Monday-Friday 8am-4pm ? ? ? Remember: ? Do not eat or drink after midnight the night before your surgery ? ? Take these medicines the morning of surgery with A SIP OF WATER:  ?amLODipine (NORVASC) ?aspirin EC ?buPROPion (WELLBUTRIN XL) ?rosuvastatin (CRESTOR) ? ?As needed: ?baclofen (LIORESAL)  ?loperamide (IMODIUM) ? ?As of today, STOP taking any Aleve, Naproxen, Ibuprofen, Motrin, Advil, Goody's, BC's, all herbal medications, fish oil, and all vitamins. ? ?WHAT DO I DO ABOUT MY DIABETES MEDICATION? ? ?The day of surgery, do not take other diabetes injectables, including Semaglutide (Ozempic). ? ?HOW TO MANAGE YOUR DIABETES ?BEFORE AND AFTER SURGERY ? ?Why is it important to control my blood sugar before and after surgery? ?Improving blood sugar levels before and after surgery helps healing and can limit problems. ?A way of improving blood sugar control is eating a healthy diet by: ? Eating less sugar and carbohydrates ? Increasing activity/exercise ? Talking with your doctor about reaching your blood sugar goals ?High blood sugars (greater than 180 mg/dL) can raise your risk of infections and slow your recovery, so you will need to focus on controlling your diabetes during the weeks before surgery. ?Make sure that the doctor who takes care of your diabetes knows about your planned surgery including the date and location. ? ?How do I manage my blood sugar before surgery? ?Check your blood sugar at least 4 times a day, starting 2 days before surgery, to make sure that the level is not too high or low. ? ?Check your blood sugar the morning of your surgery when you wake up and every 2 hours  until you get to the Short Stay unit. ? ?If your blood sugar is less than 70 mg/dL, you will need to treat for low blood sugar: ?Do not take insulin. ?Treat a low blood sugar (less than 70 mg/dL) with ? cup of clear juice (cranberry or apple), 4 glucose tablets, OR glucose gel. ?Recheck blood sugar in 15 minutes after treatment (to make sure it is greater than 70 mg/dL). If your blood sugar is not greater than 70 mg/dL on recheck, call [phone] for further instructions. ?Report your blood sugar to the short stay nurse when you get to Short Stay. ? ?If you are admitted to the hospital after surgery: ?Your blood sugar will be checked by the staff and you will probably be given insulin after surgery (instead of oral diabetes medicines) to make sure you have good blood sugar levels. ?The goal for blood sugar control after surgery is 80-180 mg/dL. ? ? ?         ?Do not wear jewelry or makeup ?Do not wear lotions, powders, perfumes, or deodorant. ?Do not shave 48 hours prior to surgery.   ?Do not bring valuables to the hospital. ?Do not wear nail polish, gel polish, artificial nails, or any other type of covering on natural nails (fingers and toes) ?If you have artificial nails or gel coating that need to be removed by a nail salon, please have this removed prior to surgery. Artificial nails or gel coating may interfere with anesthesia's ability to adequately monitor your vital signs. ? ?[facility] is  not responsible for any belongings or valuables. .  ? ?Do NOT Smoke (Tobacco/Vaping)  24 hours prior to your procedure ? ?If you use a CPAP at night, you may bring your mask for your overnight stay. ?  ?Contacts, glasses, hearing aids, dentures or partials may not be worn into surgery, please bring cases for these belongings ?  ?For patients admitted to the hospital, discharge time will be determined by your treatment team. ?  ?Patients discharged the day of surgery will not be allowed to drive home, and someone needs  to stay with them for 24 hours. ? ? ?SURGICAL WAITING ROOM VISITATION ?Patients having surgery or a procedure in a hospital may have two support people. ?Children under the age of 56 must have an adult with them who is not the patient. ?They may stay in the waiting area during the procedure and may switch out with other visitors. If the patient needs to stay at the hospital during part of their recovery, the visitor guidelines for inpatient rooms apply. ? ?Please refer to the [facility] website for the visitor guidelines for Inpatients (after your surgery is over and you are in a regular room).  ? ? ? ? ? ?Special instructions:   ? ?Oral Hygiene is also important to reduce your risk of infection.  Remember - BRUSH YOUR TEETH THE MORNING OF SURGERY WITH YOUR REGULAR TOOTHPASTE ? ? ?[facility]- Preparing For Surgery ? ?Before surgery, you can play an important role. Because skin is not sterile, your skin needs to be as free of germs as possible. You can reduce the number of germs on your skin by washing with CHG (chlorahexidine gluconate) Soap before surgery.  CHG is an antiseptic cleaner which kills germs and bonds with the skin to continue killing germs even after washing.   ? ? ?Please do not use if you have an allergy to CHG or antibacterial soaps. If your skin becomes reddened/irritated stop using the CHG.  ?Do not shave (including legs and underarms) for at least 48 hours prior to first CHG shower. It is OK to shave your face. ? ?Please follow these instructions carefully. ?  ? ? Shower the NIGHT BEFORE SURGERY and the MORNING OF SURGERY with CHG Soap.  ? If you chose to wash your hair, wash your hair first as usual with your normal shampoo. After you shampoo, rinse your hair and body thoroughly to remove the shampoo.  Then [employer] and genitals (private parts) with your normal soap and rinse thoroughly to remove soap. ? ?After that Use CHG Soap as you would any other liquid soap. You can apply CHG directly to  the skin and wash gently with a scrungie or a clean washcloth.  ? ?Apply the CHG Soap to your body ONLY FROM THE NECK DOWN.  Do not use on open wounds or open sores. Avoid contact with your eyes, ears, mouth and genitals (private parts). Wash Face and genitals (private parts)  with your normal soap.  ? ?Wash thoroughly, paying special attention to the area where your surgery will be performed. ? ?Thoroughly rinse your body with warm water from the neck down. ? ?DO NOT shower/wash with your normal soap after using and rinsing off the CHG Soap. ? ?Pat yourself dry with a CLEAN TOWEL. ? ?Wear CLEAN PAJAMAS to bed the night before surgery ? ?Place CLEAN SHEETS on your bed the night before your surgery ? ?DO NOT SLEEP WITH PETS. ? ? ?Day of Surgery: ? ?Take  a shower with CHG soap. ?Wear Clean/Comfortable clothing the morning of surgery ?Do not apply any deodorants/lotions.   ?Remember to brush your teeth WITH YOUR REGULAR TOOTHPASTE. ? ? ? ?If you received a COVID test during your pre-op visit, it is requested that you wear a mask when out in public, stay away from anyone that may not be feeling well, and notify your surgeon if you develop symptoms. If you have been in contact with anyone that has tested positive in the last 10 days, please notify your surgeon. ? ?  ?Please read over the following fact sheets that you were given.  ? ?

## 2021-07-12 ENCOUNTER — Encounter (HOSPITAL_COMMUNITY)
Admission: RE | Admit: 2021-07-12 | Discharge: 2021-07-12 | Disposition: A | Discharge status: Home / Self Care | Payer: Medicare HMO | Source: Ambulatory Visit | Attending: Vascular Surgery | Admitting: Vascular Surgery

## 2021-07-12 ENCOUNTER — Encounter (HOSPITAL_COMMUNITY): Payer: Self-pay

## 2021-07-12 ENCOUNTER — Other Ambulatory Visit: Payer: Self-pay

## 2021-07-12 VITALS — BP 148/66 | HR 62 | Temp 98.5°F | Resp 17 | Ht 64.0 in | Wt 189.7 lb

## 2021-07-12 DIAGNOSIS — I739 Peripheral vascular disease, unspecified: Secondary | ICD-10-CM | POA: Insufficient documentation

## 2021-07-12 DIAGNOSIS — Z01812 Encounter for preprocedural laboratory examination: Secondary | ICD-10-CM | POA: Insufficient documentation

## 2021-07-12 DIAGNOSIS — Z01818 Encounter for other preprocedural examination: Secondary | ICD-10-CM

## 2021-07-12 LAB — URINALYSIS, ROUTINE W REFLEX MICROSCOPIC
Bilirubin Urine: NEGATIVE
Glucose, UA: NEGATIVE mg/dL
Hgb urine dipstick: NEGATIVE
Ketones, ur: NEGATIVE mg/dL
Nitrite: NEGATIVE
Protein, ur: 30 mg/dL — AB
Specific Gravity, Urine: 1.023 (ref 1.005–1.030)
pH: 5 (ref 5.0–8.0)

## 2021-07-12 LAB — COMPREHENSIVE METABOLIC PANEL
ALT: 17 U/L (ref 0–44)
AST: 19 U/L (ref 15–41)
Albumin: 3.7 g/dL (ref 3.5–5.0)
Alkaline Phosphatase: 69 U/L (ref 38–126)
Anion gap: 6 (ref 5–15)
BUN: 13 mg/dL (ref 8–23)
CO2: 27 mmol/L (ref 22–32)
Calcium: 9.1 mg/dL (ref 8.9–10.3)
Chloride: 105 mmol/L (ref 98–111)
Creatinine, Ser: 0.78 mg/dL (ref 0.44–1.00)
GFR, Estimated: >60 mL/min (ref >60)
Glucose, Bld: 90 mg/dL (ref 70–99)
Potassium: 3.7 mmol/L (ref 3.5–5.1)
Sodium: 138 mmol/L (ref 135–145)
Total Bilirubin: 0.5 mg/dL (ref 0.3–1.2)
Total Protein: 7.2 g/dL (ref 6.5–8.1)

## 2021-07-12 LAB — GLUCOSE, CAPILLARY: Glucose-Capillary: 121 mg/dL — ABNORMAL HIGH (ref 70–99)

## 2021-07-12 LAB — CBC
HCT: 44 % (ref 36.0–46.0)
Hemoglobin: 14.3 g/dL (ref 12.0–15.0)
MCH: 29.1 pg (ref 26.0–34.0)
MCHC: 32.5 g/dL (ref 30.0–36.0)
MCV: 89.6 fL (ref 80.0–100.0)
Platelets: 336 10*3/uL (ref 150–400)
RBC: 4.91 MIL/uL (ref 3.87–5.11)
RDW: 13.8 % (ref 11.5–15.5)
WBC: 12.7 10*3/uL — ABNORMAL HIGH (ref 4.0–10.5)
nRBC: 0 % (ref 0.0–0.2)

## 2021-07-12 LAB — SURGICAL PCR SCREEN
MRSA, PCR: NEGATIVE
Staphylococcus aureus: NEGATIVE

## 2021-07-12 LAB — PROTIME-INR
INR: 1 (ref 0.8–1.2)
Prothrombin Time: 13.1 seconds (ref 11.4–15.2)

## 2021-07-12 LAB — HEMOGLOBIN A1C
Hgb A1c MFr Bld: 5.7 % — ABNORMAL HIGH (ref 4.8–5.6)
Mean Plasma Glucose: 116.89 mg/dL

## 2021-07-12 LAB — TYPE AND SCREEN
ABO/RH(D): O POS
Antibody Screen: NEGATIVE

## 2021-07-12 LAB — APTT: aPTT: 33 seconds (ref 24–36)

## 2021-07-12 MED ORDER — SULFAMETHOXAZOLE-TRIMETHOPRIM 800-160 MG PO TABS: 1.0000 | ORAL_TABLET | Freq: Two times a day (BID) | ORAL | 0 refills | Status: DC

## 2021-07-12 NOTE — Progress Notes (Signed)
PCP - Dettinger, Vonna Kotyk, MD ?Cardiologist - denies ? ?PPM/ICD - denies ?Device Orders - n/a ?Rep Notified - n/a ? ?Chest x-ray - n/a ?EKG - 07/07/2021 ?Stress Test - denies ?ECHO - denies ?Cardiac Cath - denies ? ?Sleep Study - denies ?CPAP - n/a ? ?Fasting Blood Sugar - patient is not checking CBG at home ?CBG today - 121 ?A1C - done in PAT on 07/12/2021 ? ?Blood Thinner Instructions: n/a ? ?Aspirin Instructions: patient will not hold Aspirin prior surgery ? ?Patient was instructed: As of today, STOP taking any Aleve, Naproxen, Ibuprofen, Motrin, Advil, Goody's, BC's, all herbal medications, fish oil, and all vitamins. ? ?ERAS Protcol - n/a ? ?COVID TEST- n/a ? ? ?Anesthesia review: yes - review EKG ? ?Patient denies shortness of breath, fever, cough and chest pain at PAT appointment ? ? ?All instructions explained to the patient, with a verbal understanding of the material. Patient agrees to go over the instructions while at home for a better understanding. Patient also instructed to self quarantine after being tested for COVID-19. The opportunity to ask questions was provided. ?  ?

## 2021-07-12 NOTE — Progress Notes (Signed)
Spoke with patient regarding abnormal labs. Advised a Rx for Bactrim DS 1 tablet daily x 5 days will be sent to her pharmacy. Will repeat U/A on day of surgery per Dr. Trula Slade. Patient verbalized understanding.  ?

## 2021-07-12 NOTE — Progress Notes (Signed)
Abnormal urinalysis in PAT. Dr. Scot Dock office was notified Hollie Salk, Gretchen Short, RN).  ?

## 2021-07-17 NOTE — Anesthesia Preprocedure Evaluation (Addendum)
Anesthesia Evaluation  ?Patient identified by MRN, date of birth, ID band ?Patient awake ? ? ? ?Reviewed: ?Allergy & Precautions, NPO status , Patient's Chart, lab work & pertinent test results ? ?Airway ?Mallampati: II ? ?TM Distance: >3 FB ?Neck ROM: Full ? ? ? Dental ? ?(+) Dental Advisory Given, Missing ?  ?Pulmonary ?Current SmokerPatient did not abstain from smoking.,  ?  ?Pulmonary exam normal ?breath sounds clear to auscultation ? ? ? ? ? ? Cardiovascular ?hypertension, Pt. on medications ?+ Peripheral Vascular Disease  ?Normal cardiovascular exam ?Rhythm:Regular Rate:Normal ? ? ?  ?Neuro/Psych ? Neuromuscular disease negative psych ROS  ? GI/Hepatic ?negative GI ROS, Neg liver ROS,   ?Endo/Other  ?diabetes, Type 2Obesity ? ? Renal/GU ?negative Renal ROS  ? ?  ?Musculoskeletal ?negative musculoskeletal ROS ?(+)  ? Abdominal ?  ?Peds ? Hematology ?negative hematology ROS ?(+)   ?Anesthesia Other Findings ? ? Reproductive/Obstetrics ? ?  ? ? ? ? ? ? ? ? ? ? ? ? ? ?  ?  ? ? ? ? ? ? ? ?Anesthesia Physical ?Anesthesia Plan ? ?ASA: 3 ? ?Anesthesia Plan: General  ? ?Post-op Pain Management: Tylenol PO (pre-op)*  ? ?Induction: Intravenous ? ?PONV Risk Score and Plan: 2 and Midazolam, Dexamethasone and Ondansetron ? ?Airway Management Planned: Oral ETT ? ?Additional Equipment: Arterial line ? ?Intra-op Plan:  ? ?Post-operative Plan: Extubation in OR ? ?Informed Consent: I have reviewed the patients History and Physical, chart, labs and discussed the procedure including the risks, benefits and alternatives for the proposed anesthesia with the patient or authorized representative who has indicated his/her understanding and acceptance.  ? ? ? ?Dental advisory given ? ?Plan Discussed with: CRNA ? ?Anesthesia Plan Comments:   ? ? ? ? ?Anesthesia Quick Evaluation ? ?

## 2021-07-18 ENCOUNTER — Inpatient Hospital Stay (HOSPITAL_COMMUNITY)
Admission: RE | Admit: 2021-07-18 | Discharge: 2021-07-20 | DRG: 253 | Disposition: A | Discharge status: Home / Self Care | Payer: Medicare HMO | Attending: Vascular Surgery | Admitting: Vascular Surgery

## 2021-07-18 ENCOUNTER — Encounter (HOSPITAL_COMMUNITY): Payer: Self-pay | Admitting: Vascular Surgery

## 2021-07-18 ENCOUNTER — Other Ambulatory Visit: Payer: Self-pay

## 2021-07-18 ENCOUNTER — Inpatient Hospital Stay (HOSPITAL_COMMUNITY): Payer: Medicare HMO | Admitting: Physician Assistant

## 2021-07-18 ENCOUNTER — Inpatient Hospital Stay (HOSPITAL_COMMUNITY): Payer: Medicare HMO

## 2021-07-18 ENCOUNTER — Encounter (HOSPITAL_COMMUNITY)
Admission: RE | Disposition: A | Discharge status: Home / Self Care | Payer: Self-pay | Source: Home / Self Care | Attending: Vascular Surgery

## 2021-07-18 DIAGNOSIS — E11622 Type 2 diabetes mellitus with other skin ulcer: Secondary | ICD-10-CM | POA: Diagnosis not present

## 2021-07-18 DIAGNOSIS — G709 Myoneural disorder, unspecified: Secondary | ICD-10-CM | POA: Diagnosis not present

## 2021-07-18 DIAGNOSIS — Z833 Family history of diabetes mellitus: Secondary | ICD-10-CM

## 2021-07-18 DIAGNOSIS — I10 Essential (primary) hypertension: Secondary | ICD-10-CM | POA: Diagnosis not present

## 2021-07-18 DIAGNOSIS — Z79899 Other long term (current) drug therapy: Secondary | ICD-10-CM

## 2021-07-18 DIAGNOSIS — L97829 Non-pressure chronic ulcer of other part of left lower leg with unspecified severity: Secondary | ICD-10-CM | POA: Diagnosis not present

## 2021-07-18 DIAGNOSIS — F17210 Nicotine dependence, cigarettes, uncomplicated: Secondary | ICD-10-CM | POA: Diagnosis present

## 2021-07-18 DIAGNOSIS — I70249 Atherosclerosis of native arteries of left leg with ulceration of unspecified site: Secondary | ICD-10-CM | POA: Diagnosis not present

## 2021-07-18 DIAGNOSIS — Z7982 Long term (current) use of aspirin: Secondary | ICD-10-CM

## 2021-07-18 DIAGNOSIS — Z800 Family history of malignant neoplasm of digestive organs: Secondary | ICD-10-CM | POA: Diagnosis not present

## 2021-07-18 DIAGNOSIS — Z8052 Family history of malignant neoplasm of bladder: Secondary | ICD-10-CM | POA: Diagnosis not present

## 2021-07-18 DIAGNOSIS — I70248 Atherosclerosis of native arteries of left leg with ulceration of other part of lower left leg: Secondary | ICD-10-CM | POA: Diagnosis not present

## 2021-07-18 DIAGNOSIS — Z9889 Other specified postprocedural states: Secondary | ICD-10-CM | POA: Diagnosis not present

## 2021-07-18 DIAGNOSIS — E1151 Type 2 diabetes mellitus with diabetic peripheral angiopathy without gangrene: Secondary | ICD-10-CM | POA: Diagnosis not present

## 2021-07-18 DIAGNOSIS — I739 Peripheral vascular disease, unspecified: Principal | ICD-10-CM | POA: Diagnosis present

## 2021-07-18 DIAGNOSIS — L97929 Non-pressure chronic ulcer of unspecified part of left lower leg with unspecified severity: Secondary | ICD-10-CM

## 2021-07-18 DIAGNOSIS — S81801D Unspecified open wound, right lower leg, subsequent encounter: Secondary | ICD-10-CM

## 2021-07-18 DIAGNOSIS — R69 Illness, unspecified: Secondary | ICD-10-CM | POA: Diagnosis not present

## 2021-07-18 HISTORY — PX: INTRAOPERATIVE ARTERIOGRAM: SHX5157

## 2021-07-18 HISTORY — PX: FEMORAL-POPLITEAL BYPASS GRAFT: SHX937

## 2021-07-18 LAB — GLUCOSE, CAPILLARY
Glucose-Capillary: 117 mg/dL — ABNORMAL HIGH (ref 70–99)
Glucose-Capillary: 149 mg/dL — ABNORMAL HIGH (ref 70–99)
Glucose-Capillary: 127 mg/dL — ABNORMAL HIGH (ref 70–99)
Glucose-Capillary: 162 mg/dL — ABNORMAL HIGH (ref 70–99)

## 2021-07-18 LAB — CBC
HCT: 38.2 % (ref 36.0–46.0)
Hemoglobin: 13 g/dL (ref 12.0–15.0)
MCH: 29.9 pg (ref 26.0–34.0)
MCHC: 34 g/dL (ref 30.0–36.0)
MCV: 87.8 fL (ref 80.0–100.0)
Platelets: 325 10*3/uL (ref 150–400)
RBC: 4.35 MIL/uL (ref 3.87–5.11)
RDW: 13.5 % (ref 11.5–15.5)
WBC: 17.2 10*3/uL — ABNORMAL HIGH (ref 4.0–10.5)
nRBC: 0 % (ref 0.0–0.2)

## 2021-07-18 LAB — CREATININE, SERUM
Creatinine, Ser: 0.93 mg/dL (ref 0.44–1.00)
GFR, Estimated: >60 mL/min (ref >60)

## 2021-07-18 LAB — ABO/RH: ABO/RH(D): O POS

## 2021-07-18 SURGERY — BYPASS GRAFT FEMORAL-POPLITEAL ARTERY
Anesthesia: General | Laterality: Left

## 2021-07-18 MED ORDER — PROPOFOL 10 MG/ML IV BOLUS
INTRAVENOUS | Status: DC | PRN
  Administered 2021-07-18: 140 mg via INTRAVENOUS

## 2021-07-18 MED ORDER — IODIXANOL 320 MG/ML IV SOLN
INTRAVENOUS | Status: DC | PRN
  Administered 2021-07-18: 15 mL

## 2021-07-18 MED ORDER — BACLOFEN 10 MG PO TABS
10.0000 mg | ORAL_TABLET | Freq: Three times a day (TID) | ORAL | Status: DC | PRN
  Filled 2021-07-18: qty 1

## 2021-07-18 MED ORDER — CHLORHEXIDINE GLUCONATE CLOTH 2 % EX PADS: 6.0000 | MEDICATED_PAD | Freq: Once | CUTANEOUS | Status: DC

## 2021-07-18 MED ORDER — SODIUM CHLORIDE 0.9 % IV SOLN
INTRAVENOUS | Status: AC | PRN
  Administered 2021-07-18: 250 mL

## 2021-07-18 MED ORDER — PAPAVERINE HCL 30 MG/ML IJ SOLN
INTRAMUSCULAR | Status: AC
  Filled 2021-07-18: qty 2

## 2021-07-18 MED ORDER — SODIUM CHLORIDE 0.9 % IV SOLN: INTRAVENOUS | Status: DC

## 2021-07-18 MED ORDER — 0.9 % SODIUM CHLORIDE (POUR BTL) OPTIME
TOPICAL | Status: DC | PRN
  Administered 2021-07-18: 2000 mL

## 2021-07-18 MED ORDER — HEPARIN SODIUM (PORCINE) 5000 UNIT/ML IJ SOLN
5000.0000 [IU] | Freq: Three times a day (TID) | INTRAMUSCULAR | Status: DC
  Administered 2021-07-19 – 2021-07-20 (×4): 5000 [IU] via SUBCUTANEOUS
  Filled 2021-07-18 (×4): qty 1

## 2021-07-18 MED ORDER — PANTOPRAZOLE SODIUM 40 MG PO TBEC
40.0000 mg | DELAYED_RELEASE_TABLET | Freq: Every day | ORAL | Status: DC
  Administered 2021-07-18 – 2021-07-20 (×3): 40 mg via ORAL
  Filled 2021-07-18 (×3): qty 1

## 2021-07-18 MED ORDER — CHLORHEXIDINE GLUCONATE 0.12 % MT SOLN
OROMUCOSAL | Status: AC
  Administered 2021-07-18: 15 mL via OROMUCOSAL
  Filled 2021-07-18: qty 15

## 2021-07-18 MED ORDER — CEFAZOLIN SODIUM-DEXTROSE 2-4 GM/100ML-% IV SOLN
2.0000 g | INTRAVENOUS | Status: AC
  Administered 2021-07-18: 2 g via INTRAVENOUS

## 2021-07-18 MED ORDER — SUCCINYLCHOLINE CHLORIDE 200 MG/10ML IV SOSY
PREFILLED_SYRINGE | INTRAVENOUS | Status: AC
  Filled 2021-07-18: qty 10

## 2021-07-18 MED ORDER — LISINOPRIL 20 MG PO TABS
40.0000 mg | ORAL_TABLET | Freq: Every day | ORAL | Status: DC
  Administered 2021-07-19 – 2021-07-20 (×2): 40 mg via ORAL
  Filled 2021-07-18 (×2): qty 2

## 2021-07-18 MED ORDER — ONDANSETRON HCL 4 MG/2ML IJ SOLN
INTRAMUSCULAR | Status: AC
  Filled 2021-07-18: qty 2

## 2021-07-18 MED ORDER — PROPOFOL 10 MG/ML IV BOLUS
INTRAVENOUS | Status: AC
  Filled 2021-07-18: qty 20

## 2021-07-18 MED ORDER — PROTAMINE SULFATE 10 MG/ML IV SOLN
INTRAVENOUS | Status: DC | PRN
  Administered 2021-07-18: 40 mg via INTRAVENOUS

## 2021-07-18 MED ORDER — FENTANYL CITRATE (PF) 250 MCG/5ML IJ SOLN
INTRAMUSCULAR | Status: AC
  Filled 2021-07-18: qty 5

## 2021-07-18 MED ORDER — ONDANSETRON HCL 4 MG/2ML IJ SOLN
4.0000 mg | Freq: Four times a day (QID) | INTRAMUSCULAR | Status: DC | PRN
  Administered 2021-07-19: 4 mg via INTRAVENOUS
  Filled 2021-07-18: qty 2

## 2021-07-18 MED ORDER — CEFAZOLIN SODIUM-DEXTROSE 2-4 GM/100ML-% IV SOLN
2.0000 g | Freq: Three times a day (TID) | INTRAVENOUS | Status: AC
  Administered 2021-07-18 – 2021-07-19 (×2): 2 g via INTRAVENOUS
  Filled 2021-07-18 (×2): qty 100

## 2021-07-18 MED ORDER — SODIUM CHLORIDE 0.9 % IV SOLN: INTRAVENOUS | Status: AC

## 2021-07-18 MED ORDER — BUPROPION HCL ER (XL) 150 MG PO TB24
150.0000 mg | ORAL_TABLET | Freq: Every day | ORAL | Status: DC
  Administered 2021-07-18 – 2021-07-20 (×3): 150 mg via ORAL
  Filled 2021-07-18 (×3): qty 1

## 2021-07-18 MED ORDER — INSULIN ASPART 100 UNIT/ML IJ SOLN: 0.0000 [IU] | INTRAMUSCULAR | Status: DC | PRN

## 2021-07-18 MED ORDER — GLYCOPYRROLATE 0.2 MG/ML IJ SOLN
INTRAMUSCULAR | Status: DC | PRN
  Administered 2021-07-18: .2 mg via INTRAVENOUS

## 2021-07-18 MED ORDER — LIDOCAINE 2% (20 MG/ML) 5 ML SYRINGE
INTRAMUSCULAR | Status: DC | PRN
  Administered 2021-07-18: 80 mg via INTRAVENOUS

## 2021-07-18 MED ORDER — CEFAZOLIN SODIUM-DEXTROSE 2-4 GM/100ML-% IV SOLN
INTRAVENOUS | Status: AC
  Filled 2021-07-18: qty 100

## 2021-07-18 MED ORDER — OXYCODONE-ACETAMINOPHEN 5-325 MG PO TABS
1.0000 | ORAL_TABLET | ORAL | Status: DC | PRN
  Administered 2021-07-18 – 2021-07-20 (×8): 2 via ORAL
  Filled 2021-07-18 (×8): qty 2

## 2021-07-18 MED ORDER — HYDRALAZINE HCL 20 MG/ML IJ SOLN: 5.0000 mg | INTRAMUSCULAR | Status: DC | PRN

## 2021-07-18 MED ORDER — ALUM & MAG HYDROXIDE-SIMETH 200-200-20 MG/5ML PO SUSP: 15.0000 mL | ORAL | Status: DC | PRN

## 2021-07-18 MED ORDER — ROCURONIUM BROMIDE 10 MG/ML (PF) SYRINGE
PREFILLED_SYRINGE | INTRAVENOUS | Status: DC | PRN
  Administered 2021-07-18: 10 mg via INTRAVENOUS
  Administered 2021-07-18 (×2): 20 mg via INTRAVENOUS
  Administered 2021-07-18: 60 mg via INTRAVENOUS

## 2021-07-18 MED ORDER — DEXAMETHASONE SODIUM PHOSPHATE 10 MG/ML IJ SOLN
INTRAMUSCULAR | Status: DC | PRN
  Administered 2021-07-18: 5 mg via INTRAVENOUS

## 2021-07-18 MED ORDER — ACETAMINOPHEN 650 MG RE SUPP: 325.0000 mg | RECTAL | Status: DC | PRN

## 2021-07-18 MED ORDER — HEPARIN SODIUM (PORCINE) 1000 UNIT/ML IJ SOLN
INTRAMUSCULAR | Status: DC | PRN
  Administered 2021-07-18: 9000 [IU] via INTRAVENOUS

## 2021-07-18 MED ORDER — EPHEDRINE SULFATE-NACL 50-0.9 MG/10ML-% IV SOSY
PREFILLED_SYRINGE | INTRAVENOUS | Status: DC | PRN
  Administered 2021-07-18 (×2): 10 mg via INTRAVENOUS

## 2021-07-18 MED ORDER — ASPIRIN EC 81 MG PO TBEC
81.0000 mg | DELAYED_RELEASE_TABLET | Freq: Every day | ORAL | Status: DC
Start: 2021-07-18 — End: 2021-07-20
  Administered 2021-07-18 – 2021-07-20 (×3): 81 mg via ORAL
  Filled 2021-07-18 (×3): qty 1

## 2021-07-18 MED ORDER — PHENYLEPHRINE 80 MCG/ML (10ML) SYRINGE FOR IV PUSH (FOR BLOOD PRESSURE SUPPORT)
PREFILLED_SYRINGE | INTRAVENOUS | Status: DC | PRN
  Administered 2021-07-18 (×2): 80 ug via INTRAVENOUS

## 2021-07-18 MED ORDER — LACTATED RINGERS IV SOLN: INTRAVENOUS | Status: DC

## 2021-07-18 MED ORDER — CHLORHEXIDINE GLUCONATE 0.12 % MT SOLN
15.0000 mL | OROMUCOSAL | Status: AC
  Filled 2021-07-18: qty 15

## 2021-07-18 MED ORDER — MIDAZOLAM HCL 2 MG/2ML IJ SOLN
INTRAMUSCULAR | Status: AC
  Filled 2021-07-18: qty 2

## 2021-07-18 MED ORDER — ONDANSETRON HCL 4 MG/2ML IJ SOLN: 4.0000 mg | Freq: Once | INTRAMUSCULAR | Status: DC | PRN

## 2021-07-18 MED ORDER — SODIUM CHLORIDE 0.9 % IV SOLN: 500.0000 mL | Freq: Once | INTRAVENOUS | Status: DC | PRN

## 2021-07-18 MED ORDER — DOCUSATE SODIUM 100 MG PO CAPS
100.0000 mg | ORAL_CAPSULE | Freq: Every day | ORAL | Status: DC
  Administered 2021-07-19 – 2021-07-20 (×2): 100 mg via ORAL
  Filled 2021-07-18 (×2): qty 1

## 2021-07-18 MED ORDER — ACETAMINOPHEN 325 MG PO TABS
325.0000 mg | ORAL_TABLET | ORAL | Status: DC | PRN
  Administered 2021-07-18 – 2021-07-19 (×2): 650 mg via ORAL
  Filled 2021-07-18 (×2): qty 2

## 2021-07-18 MED ORDER — ROSUVASTATIN CALCIUM 5 MG PO TABS
5.0000 mg | ORAL_TABLET | Freq: Every day | ORAL | Status: DC
  Administered 2021-07-18 – 2021-07-20 (×3): 5 mg via ORAL
  Filled 2021-07-18 (×3): qty 1

## 2021-07-18 MED ORDER — ONDANSETRON HCL 4 MG/2ML IJ SOLN
INTRAMUSCULAR | Status: DC | PRN
  Administered 2021-07-18: 4 mg via INTRAVENOUS

## 2021-07-18 MED ORDER — SUGAMMADEX SODIUM 200 MG/2ML IV SOLN
INTRAVENOUS | Status: DC | PRN
  Administered 2021-07-18: 200 mg via INTRAVENOUS

## 2021-07-18 MED ORDER — FENTANYL CITRATE (PF) 100 MCG/2ML IJ SOLN
25.0000 ug | INTRAMUSCULAR | Status: DC | PRN
  Administered 2021-07-18: 25 ug via INTRAVENOUS

## 2021-07-18 MED ORDER — POTASSIUM CHLORIDE CRYS ER 20 MEQ PO TBCR: 20.0000 meq | EXTENDED_RELEASE_TABLET | Freq: Every day | ORAL | Status: DC | PRN

## 2021-07-18 MED ORDER — ROCURONIUM BROMIDE 10 MG/ML (PF) SYRINGE
PREFILLED_SYRINGE | INTRAVENOUS | Status: AC
  Filled 2021-07-18: qty 10

## 2021-07-18 MED ORDER — AMLODIPINE BESYLATE 5 MG PO TABS
5.0000 mg | ORAL_TABLET | Freq: Every day | ORAL | Status: DC
  Administered 2021-07-18 – 2021-07-20 (×3): 5 mg via ORAL
  Filled 2021-07-18 (×3): qty 1

## 2021-07-18 MED ORDER — METOPROLOL TARTRATE 5 MG/5ML IV SOLN: 2.0000 mg | INTRAVENOUS | Status: DC | PRN

## 2021-07-18 MED ORDER — FENTANYL CITRATE (PF) 100 MCG/2ML IJ SOLN
INTRAMUSCULAR | Status: AC
  Filled 2021-07-18: qty 2

## 2021-07-18 MED ORDER — LABETALOL HCL 5 MG/ML IV SOLN: 10.0000 mg | INTRAVENOUS | Status: DC | PRN

## 2021-07-18 MED ORDER — LIDOCAINE 2% (20 MG/ML) 5 ML SYRINGE
INTRAMUSCULAR | Status: AC
  Filled 2021-07-18: qty 5

## 2021-07-18 MED ORDER — PHENYLEPHRINE HCL-NACL 20-0.9 MG/250ML-% IV SOLN
INTRAVENOUS | Status: AC
  Filled 2021-07-18: qty 500

## 2021-07-18 MED ORDER — LACTATED RINGERS IV SOLN: INTRAVENOUS | Status: DC | PRN

## 2021-07-18 MED ORDER — MAGNESIUM SULFATE 2 GM/50ML IV SOLN: 2.0000 g | Freq: Every day | INTRAVENOUS | Status: DC | PRN

## 2021-07-18 MED ORDER — ACETAMINOPHEN 500 MG PO TABS: 1000.0000 mg | ORAL_TABLET | Freq: Once | ORAL | Status: AC

## 2021-07-18 MED ORDER — MIDAZOLAM HCL 2 MG/2ML IJ SOLN
INTRAMUSCULAR | Status: DC | PRN
  Administered 2021-07-18: 2 mg via INTRAVENOUS

## 2021-07-18 MED ORDER — PHENYLEPHRINE HCL-NACL 20-0.9 MG/250ML-% IV SOLN
INTRAVENOUS | Status: DC | PRN
Start: 2021-07-18 — End: 2021-07-18
  Administered 2021-07-18: 30 ug/min via INTRAVENOUS

## 2021-07-18 MED ORDER — HEPARIN 6000 UNIT IRRIGATION SOLUTION
Status: DC | PRN
  Administered 2021-07-18: 1

## 2021-07-18 MED ORDER — HYDROMORPHONE HCL 1 MG/ML IJ SOLN
0.5000 mg | INTRAMUSCULAR | Status: DC | PRN
  Administered 2021-07-18 – 2021-07-19 (×4): 1 mg via INTRAVENOUS
  Filled 2021-07-18 (×4): qty 1

## 2021-07-18 MED ORDER — HEPARIN 6000 UNIT IRRIGATION SOLUTION
Status: AC
  Filled 2021-07-18: qty 500

## 2021-07-18 MED ORDER — ACETAMINOPHEN 500 MG PO TABS
ORAL_TABLET | ORAL | Status: AC
  Administered 2021-07-18: 1000 mg via ORAL
  Filled 2021-07-18: qty 2

## 2021-07-18 MED ORDER — GUAIFENESIN-DM 100-10 MG/5ML PO SYRP: 15.0000 mL | ORAL_SOLUTION | ORAL | Status: DC | PRN

## 2021-07-18 MED ORDER — FENTANYL CITRATE (PF) 250 MCG/5ML IJ SOLN
INTRAMUSCULAR | Status: DC | PRN
  Administered 2021-07-18: 100 ug via INTRAVENOUS
  Administered 2021-07-18 (×2): 25 ug via INTRAVENOUS
  Administered 2021-07-18 (×2): 50 ug via INTRAVENOUS

## 2021-07-18 MED ORDER — PAPAVERINE HCL 30 MG/ML IJ SOLN
INTRAMUSCULAR | Status: DC | PRN
  Administered 2021-07-18: 2 mL

## 2021-07-18 MED ORDER — PHENOL 1.4 % MT LIQD: 1.0000 | OROMUCOSAL | Status: DC | PRN

## 2021-07-18 SURGICAL SUPPLY — 66 items
ADH SKN CLS APL DERMABOND .7 (GAUZE/BANDAGES/DRESSINGS) ×4
BAG COUNTER SPONGE SURGICOUNT (BAG) ×3 IMPLANT
BAG SPNG CNTER NS LX DISP (BAG) ×1
BANDAGE ESMARK 6X9 LF (GAUZE/BANDAGES/DRESSINGS) IMPLANT
BNDG CMPR 9X6 STRL LF SNTH (GAUZE/BANDAGES/DRESSINGS) ×1
BNDG ESMARK 6X9 LF (GAUZE/BANDAGES/DRESSINGS) ×2
CANISTER SUCT 3000ML PPV (MISCELLANEOUS) ×3 IMPLANT
CANNULA VESSEL 3MM 2 BLNT TIP (CANNULA) ×6 IMPLANT
CLIP TI WIDE RED SMALL 24 (CLIP) ×1 IMPLANT
CLIP VESOCCLUDE MED 24/CT (CLIP) ×3 IMPLANT
CLIP VESOCCLUDE SM WIDE 24/CT (CLIP) ×3 IMPLANT
COVER PROBE W GEL 5X96 (DRAPES) IMPLANT
CUFF TOURN SGL QUICK 24 (TOURNIQUET CUFF)
CUFF TOURN SGL QUICK 34 (TOURNIQUET CUFF) ×2
CUFF TOURN SGL QUICK 42 (TOURNIQUET CUFF) IMPLANT
CUFF TRNQT CYL 24X4X16.5-23 (TOURNIQUET CUFF) IMPLANT
CUFF TRNQT CYL 34X4.125X (TOURNIQUET CUFF) IMPLANT
CUFF TRNQT CYL 34X4X40X1 (TOURNIQUET CUFF) IMPLANT
DERMABOND ADVANCED (GAUZE/BANDAGES/DRESSINGS) ×4
DERMABOND ADVANCED .7 DNX12 (GAUZE/BANDAGES/DRESSINGS) ×2 IMPLANT
DRAIN CHANNEL 15F RND FF W/TCR (WOUND CARE) IMPLANT
DRAPE HALF SHEET 40X57 (DRAPES) IMPLANT
DRAPE X-RAY CASS 24X20 (DRAPES) IMPLANT
DRESSING PEEL AND PLC PRVNA 13 (GAUZE/BANDAGES/DRESSINGS) IMPLANT
DRSG PEEL AND PLACE PREVENA 13 (GAUZE/BANDAGES/DRESSINGS) ×2
ELECT REM PT RETURN 9FT ADLT (ELECTROSURGICAL) ×2
ELECTRODE REM PT RTRN 9FT ADLT (ELECTROSURGICAL) ×2 IMPLANT
EVACUATOR SILICONE 100CC (DRAIN) IMPLANT
GAUZE 4X4 16PLY ~~LOC~~+RFID DBL (SPONGE) ×2 IMPLANT
GLOVE BIO SURGEON STRL SZ7.5 (GLOVE) ×3 IMPLANT
GLOVE BIOGEL PI IND STRL 8 (GLOVE) ×2 IMPLANT
GLOVE BIOGEL PI INDICATOR 8 (GLOVE) ×1
GLOVE SURG POLY ORTHO LF SZ7.5 (GLOVE) IMPLANT
GLOVE SURG UNDER LTX SZ8 (GLOVE) ×3 IMPLANT
GOWN STRL REUS W/ TWL LRG LVL3 (GOWN DISPOSABLE) ×6 IMPLANT
GOWN STRL REUS W/TWL LRG LVL3 (GOWN DISPOSABLE) ×6
INSERT FOGARTY SM (MISCELLANEOUS) ×1 IMPLANT
KIT BASIN OR (CUSTOM PROCEDURE TRAY) ×3 IMPLANT
KIT DRSG PREVENA PLUS 7DAY 125 (MISCELLANEOUS) ×1 IMPLANT
KIT TURNOVER KIT B (KITS) ×3 IMPLANT
MARKER GRAFT CORONARY BYPASS (MISCELLANEOUS) IMPLANT
NS IRRIG 1000ML POUR BTL (IV SOLUTION) ×6 IMPLANT
PACK PERIPHERAL VASCULAR (CUSTOM PROCEDURE TRAY) ×3 IMPLANT
PAD ARMBOARD 7.5X6 YLW CONV (MISCELLANEOUS) ×6 IMPLANT
SET COLLECT BLD 21X3/4 12 (NEEDLE) ×1 IMPLANT
SPONGE SURGIFOAM ABS GEL 100 (HEMOSTASIS) IMPLANT
SPONGE T-LAP 18X18 ~~LOC~~+RFID (SPONGE) ×2 IMPLANT
STOPCOCK 4 WAY LG BORE MALE ST (IV SETS) ×1 IMPLANT
SUT ETHILON 3 0 PS 1 (SUTURE) IMPLANT
SUT MNCRL AB 4-0 PS2 18 (SUTURE) ×8 IMPLANT
SUT PROLENE 5 0 C 1 24 (SUTURE) ×5 IMPLANT
SUT PROLENE 6 0 BV (SUTURE) ×8 IMPLANT
SUT SILK 2 0 SH (SUTURE) ×4 IMPLANT
SUT SILK 3 0 (SUTURE) ×2
SUT SILK 3-0 18XBRD TIE 12 (SUTURE) IMPLANT
SUT VIC AB 2-0 CT1 27 (SUTURE) ×2
SUT VIC AB 2-0 CT1 TAPERPNT 27 (SUTURE) IMPLANT
SUT VIC AB 2-0 CTB1 (SUTURE) ×6 IMPLANT
SUT VIC AB 3-0 SH 27 (SUTURE) ×10
SUT VIC AB 3-0 SH 27X BRD (SUTURE) ×4 IMPLANT
SYR 30ML LL (SYRINGE) IMPLANT
TOWEL GREEN STERILE (TOWEL DISPOSABLE) ×3 IMPLANT
TRAY FOLEY MTR SLVR 16FR STAT (SET/KITS/TRAYS/PACK) ×3 IMPLANT
TUBING EXTENTION W/L.L. (IV SETS) ×1 IMPLANT
UNDERPAD 30X36 HEAVY ABSORB (UNDERPADS AND DIAPERS) ×3 IMPLANT
WATER STERILE IRR 1000ML POUR (IV SOLUTION) ×3 IMPLANT

## 2021-07-18 NOTE — Interval H&P Note (Signed)
History and Physical Interval Note: ? ?07/18/2021 ?7:33 AM ? ?Kimberly Donaldson  has presented today for surgery, with the diagnosis of PAD.  The various methods of treatment have been discussed with the patient and family. After consideration of risks, benefits and other options for treatment, the patient has consented to  Procedure(s): ?LEFT FEMORAL-POPLITEAL ARTERY BYPASS WITH VEIN (Left) as a surgical intervention.  The patient's history has been reviewed, patient examined, no change in status, stable for surgery.  I have reviewed the patient's chart and labs.  Questions were answered to the patient's satisfaction.   ? ? ?Deitra Mayo ? ? ?

## 2021-07-18 NOTE — Anesthesia Procedure Notes (Signed)
Procedure Name: Intubation ?Date/Time: 07/18/2021 7:43 AM ?Performed by: Mariea Clonts, CRNA ?Pre-anesthesia Checklist: Patient identified, Emergency Drugs available, Suction available and Patient being monitored ?Patient Re-evaluated:Patient Re-evaluated prior to induction ?Oxygen Delivery Method: Circle system utilized ?Preoxygenation: Pre-oxygenation with 100% oxygen ?Induction Type: IV induction ?Ventilation: Mask ventilation without difficulty ?Laryngoscope Size: Mac and 3 ?Grade View: Grade II ?Tube type: Oral ?Tube size: 7.0 mm ?Number of attempts: 1 ?Airway Equipment and Method: Stylet ?Placement Confirmation: ETT inserted through vocal cords under direct vision, positive ETCO2 and breath sounds checked- equal and bilateral ?Secured at: 23 cm ?Tube secured with: Tape ?Dental Injury: Teeth and Oropharynx as per pre-operative assessment  ? ? ? ? ?

## 2021-07-18 NOTE — Progress Notes (Signed)
? ?  VASCULAR SURGERY POST OP:  ? ?Doing well postop.  Excellent Doppler signals in the left foot. ? ?Begin daily dressing changes to the left posterior calf wound daily with Silvadene.  I have written these orders. ? ?SUBJECTIVE:  ? ?No complaints. ? ?PHYSICAL EXAM:  ? ?Vitals:  ? 07/18/21 1405 07/18/21 1435 07/18/21 1505 07/18/21 1600  ?BP: (!) 118/53 (!) 118/52 (!) 125/56 124/61  ?Pulse: (!) 58 (!) 57 (!) 57 68  ?Resp: '15 13 15 20  '$ ?Temp:    98 ?F (36.7 ?C)  ?TempSrc:    Oral  ?SpO2: 93% 93% 94% 98%  ?Weight:      ?Height:      ? ?Brisk Doppler signals in the left foot. ?Her incisions look fine. ? ?LABS:  ? ?CBG (last 3)  ?Recent Labs  ?  07/18/21 ?1916 07/18/21 ?1206 07/18/21 ?1634  ?GLUCAP 117* 149* 127*  ? ? ?PROBLEM LIST:   ? ?Principal Problem: ?  PAD (peripheral artery disease) ([place]) ? ? ?CURRENT MEDS:  ? ? amLODipine  5 mg Oral Daily  ? aspirin EC  81 mg Oral Daily  ? buPROPion  150 mg Oral Daily  ? [START ON 07/19/2021] docusate sodium  100 mg Oral Daily  ? fentaNYL      ? [START ON 07/19/2021] heparin  5,000 Units Subcutaneous Q8H  ? [START ON 07/19/2021] lisinopril  40 mg Oral Daily  ? pantoprazole  40 mg Oral Daily  ? rosuvastatin  5 mg Oral Daily  ? ? ?Deitra Mayo ?Office: [phone] ?07/18/2021 ? ?

## 2021-07-18 NOTE — Transfer of Care (Signed)
Immediate Anesthesia Transfer of Care Note ? ?Patient: Kimberly Donaldson ? ?Procedure(s) Performed: LEFT FEMORAL-POPLITEAL ARTERY BYPASS WITH VEIN (Left) ?INTRA OPERATIVE ARTERIOGRAM (Left) ? ?Patient Location: PACU ? ?Anesthesia Type:General ? ?Level of Consciousness: drowsy and responds to stimulation ? ?Airway & Oxygen Therapy: Patient connected to face mask oxygen ? ?Post-op Assessment: Report given to RN, Post -op Vital signs reviewed and stable and Patient moving all extremities X 4 ? ?Post vital signs: Reviewed and stable ? ?Last Vitals:  ?Vitals Value Taken Time  ?BP 110/51 07/18/21 1205  ?Temp 36.8 ?C 07/18/21 1205  ?Pulse 56 07/18/21 1206  ?Resp 9 07/18/21 1206  ?SpO2 97 % 07/18/21 1206  ?Vitals shown include unvalidated device data. ? ?Last Pain:  ?Vitals:  ? 07/18/21 0601  ?PainSc: 0-No pain  ?   ? ?Patients Stated Pain Goal: 0 (07/18/21 0601) ? ?Complications: No notable events documented. ?

## 2021-07-18 NOTE — Op Note (Signed)
? ? ?NAME: Kimberly Donaldson    MRN: [id number] ?DOB: 13-Apr-1955    DATE OF OPERATION: 07/18/2021 ? ?PREOP DIAGNOSIS:   ? ?PERIPHERAL ARTERIAL DISEASE WITH LEFT LEG ULCER ? ?POSTOP DIAGNOSIS:   ? ?SAME ? ?PROCEDURE:  ?  ?LEFT FEMORAL TO BELOW-KNEE POPLITEAL ARTERY BYPASS WITH VEIN ?INTRAOPERATIVE ARTERIOGRAM ? ?SURGEON: Judeth Cornfield. Scot Dock, MD ? ?ASSIST: Arlee Muslim, PA ? ?ANESTHESIA: General ? ?EBL: 100 cc ? ?INDICATIONS:  ? ? Kimberly Donaldson is a 66 y.o. female who presented with a nonhealing wound on the posterior aspect of her left calf.  She had evidence of significant infrainguinal arterial occlusive disease.  She underwent an arteriogram which showed a long segment occlusion of the superficial femoral artery.  I felt that her best option for limb salvage was left femoral to below-knee popliteal artery bypass. ? ?FINDINGS:  ? ?The vein was adequate size for bypass to the below-knee popliteal artery which was patent.  There was an excellent anterior tibial and posterior tibial signal at the completion of procedure.  Completion arteriogram showed no technical problems with moderate tibial arterial occlusive disease. ? ?TECHNIQUE:  ? ?The patient was taken to the operating room and received a general anesthetic.  The left leg and lower abdomen were prepped and draped in usual sterile fashion.  Of note I take the pannus superiorly.  Given her obesity oblique incision was made in the left groin and the dissection carried down to the common femoral artery.  The superficial femoral artery, deep femoral artery, external iliac artery, and multiple side branches were controlled.  Through the same incision I dissected free the saphenofemoral junction.  Using approximately 5 additional incisions along the medial aspect of the left leg the great saphenous vein was harvested down to the mid calf.  Branches were divided tween clips and 3-0 silk ties.  Through the distal incision the below-knee popliteal artery was exposed and  this was soft.  A tunnel was freed from this incision to the groin incision.  Of note the vein became bifid distally and became smaller.  I took both of these branches.  The vein was then irrigated up with heparinized saline and was felt to be adequate size and I felt that I could likely cut off the 2 smaller branches thus using only the largest segment of the vein.  The proximal aspect of the vein was brought to the field and the proximal valves left sharply excised.  I used the vein in a nonreversed fashion.  The patient was heparinized. ? ?The external iliac artery, deep femoral artery and common femoral arteries were controlled.  A longitudinal arteriotomy was made in the common femoral artery and the vein was spatulated and sewn end-to-side to the common femoral artery using continuous 5-0 Prolene suture.  Prior to completing this anastomosis the artery was flushed and there was excellent inflow.  The anastomosis was completed.  Next using a retrograde [place] valvulotome the valves were sharply excised.  I made 2 passes and there was excellent flow established to the vein.  It was then marked to prevent twisting.  It was then brought through the previously created tunnel for anastomosis to the below-knee popliteal artery.  A tourniquet was placed on the thigh.  The leg was exsanguinated with an Esmarch bandage and the tourniquet inflated to 300 mmHg.  Under tourniquet control, a longitudinal arteriotomy was made in the below-knee popliteal artery.  The vein was cut to the appropriate length.  Of note  again the smaller segments were excised using only the largest segment of vein.  This was spatulated and sewn end-to-side to the below-knee popliteal artery using continuous 6-0 Prolene suture.  Prior to completing this anastomosis the tourniquet was released.  The arteries were backbled and flushed appropriately and the anastomosis completed. ? ?Intraoperative arteriogram was obtained by cannulating the proximal  vein.  No technical problems were identified.  There is moderate tibial artery occlusive disease.  Assistant a suture was placed where the cannulation site was.  Hemostasis was obtained in the wounds.  The vein harvest incisions were each closed with a deep layer of 3-0 Vicryl and the skin closed with 4-0 Monocryl.  The below the knee incision was closed with 2 deep layers of 3-0 Vicryl and the skin closed with 4-0 Monocryl.  The groin incision was closed with a deep layer over the bypass graft to include the femoral fascia.  An additional deep layer of 2-0 Vicryl was placed and then a 3-0 Vicryl was placed.  The skin was closed with 4-0 Monocryl.  A Prevena dressing was placed on the left groin.  At the completion was an excellent posterior tibial and anterior tibial signal with the Doppler.  Dermabond was placed on the remaining incisions. ? ?Patient tolerated the procedure well was transferred to recovery room in stable condition.  All needle and sponge counts were correct. ? ? ?Given the complexity of the case,  the assistant was necessary in order to expedient the procedure and safely perform the technical aspects of the operation.  The assistant provided traction and countertraction to assist with exposure of the artery proximally and distally.  They assisted with suture ligature of multiple branches.  Their assistance was critical in the performance of both the proximal and distal anastomosis.These skills, especially following the Prolene suture for the anastomosis, could not have been adequately performed by a scrub tech assistant.  ? ? ?Deitra Mayo, MD, FACS ?[facility] ? ?DATE OF DICTATION:   07/18/2021 ? ?

## 2021-07-19 ENCOUNTER — Other Ambulatory Visit: Payer: Self-pay

## 2021-07-19 ENCOUNTER — Encounter (HOSPITAL_COMMUNITY): Payer: Self-pay | Admitting: Vascular Surgery

## 2021-07-19 LAB — BASIC METABOLIC PANEL
Anion gap: 8 (ref 5–15)
BUN: 18 mg/dL (ref 8–23)
CO2: 22 mmol/L (ref 22–32)
Calcium: 8.4 mg/dL — ABNORMAL LOW (ref 8.9–10.3)
Chloride: 105 mmol/L (ref 98–111)
Creatinine, Ser: 0.84 mg/dL (ref 0.44–1.00)
GFR, Estimated: >60 mL/min (ref >60)
Glucose, Bld: 142 mg/dL — ABNORMAL HIGH (ref 70–99)
Potassium: 5 mmol/L (ref 3.5–5.1)
Sodium: 135 mmol/L (ref 135–145)

## 2021-07-19 LAB — GLUCOSE, CAPILLARY
Glucose-Capillary: 112 mg/dL — ABNORMAL HIGH (ref 70–99)
Glucose-Capillary: 114 mg/dL — ABNORMAL HIGH (ref 70–99)
Glucose-Capillary: 126 mg/dL — ABNORMAL HIGH (ref 70–99)
Glucose-Capillary: 128 mg/dL — ABNORMAL HIGH (ref 70–99)

## 2021-07-19 LAB — CBC
HCT: 34.4 % — ABNORMAL LOW (ref 36.0–46.0)
Hemoglobin: 11.7 g/dL — ABNORMAL LOW (ref 12.0–15.0)
MCH: 29.8 pg (ref 26.0–34.0)
MCHC: 34 g/dL (ref 30.0–36.0)
MCV: 87.8 fL (ref 80.0–100.0)
Platelets: 286 10*3/uL (ref 150–400)
RBC: 3.92 MIL/uL (ref 3.87–5.11)
RDW: 13.6 % (ref 11.5–15.5)
WBC: 12.7 10*3/uL — ABNORMAL HIGH (ref 4.0–10.5)
nRBC: 0 % (ref 0.0–0.2)

## 2021-07-19 NOTE — Evaluation (Signed)
Occupational Therapy Evaluation ?Patient Details ?Name: Kimberly Donaldson ?MRN: [id number] ?DOB: 1956/01/10 ?Today's Date: 07/19/2021 ? ? ?History of Present Illness Pt is a 66 y.o. F who presents s/p left femoral to below knee popliteal bypass 07/18/2021. Significant PMH: DM, HTN.  ? ?Clinical Impression ?  ?Pt was independent prior to admission. She lives with her daughter and grandchildren. Pt primarily limited by post operative pain. Instructed in compensatory strategies for LB ADLs and multiple uses of 3 in 1. She mobilizes modified independently with RW and needs up to min assist for LB ADLs. Pt declined AE, will rely on her family to assist until pain subsides and she can return to independence. No further OT needs.  ?   ? ?Recommendations for follow up therapy are one component of a multi-disciplinary discharge planning process, led by the attending physician.  Recommendations may be updated based on patient status, additional functional criteria and insurance authorization.  ? ?Follow Up Recommendations ? No OT follow up  ?  ?Assistance Recommended at Discharge PRN  ?Patient can return home with the following A little help with bathing/dressing/bathroom;Assistance with cooking/housework;Help with stairs or ramp for entrance ? ?  ?Functional Status Assessment ? Patient has had a recent decline in their functional status and demonstrates the ability to make significant improvements in function in a reasonable and predictable amount of time.  ?Equipment Recommendations ? BSC/3in1 (pt only wants if covered by insurance)  ?  ?Recommendations for Other Services   ? ? ?  ?Precautions / Restrictions Precautions ?Precautions: Fall ?Restrictions ?Weight Bearing Restrictions: No  ? ?  ? ?Mobility Bed Mobility ?Overal bed mobility: Modified Independent ?  ?  ?  ?  ?  ?  ?General bed mobility comments: HOB up ?  ? ?Transfers ?Overall transfer level: Modified independent ?Equipment used: Rolling walker (2 wheels) ?  ?  ?  ?  ?  ?   ?  ?  ?  ? ?  ?Balance Overall balance assessment: Mild deficits observed, not formally tested ?  ?  ?  ?  ?  ?  ?  ?  ?  ?  ?  ?  ?  ?  ?  ?  ?  ?  ?   ? ?ADL either performed or assessed with clinical judgement  ? ?ADL Overall ADL's : Needs assistance/impaired ?Eating/Feeding: Independent;Sitting ?  ?Grooming: Standing;Wash/dry hands;Modified independent ?  ?Upper Body Bathing: Set up;Sitting ?  ?Lower Body Bathing: Minimal assistance;Sit to/from stand ?Lower Body Bathing Details (indicate cue type and reason): recommended long handled bath sponge ?Upper Body Dressing : Set up;Sitting ?  ?Lower Body Dressing: Set up;Sitting/lateral leans ?Lower Body Dressing Details (indicate cue type and reason): instructed to dress operated leg first, pt does not intend to wear socks and will use slip on shoes ?Toilet Transfer: Modified Independent;Ambulation;BSC/3in1;Rolling walker (2 wheels) ?Toilet Transfer Details (indicate cue type and reason): instructed in use of 3 in 1 over toilet ?Toileting- Clothing Manipulation and Hygiene: Modified independent;Sit to/from stand ?  ?  ?  ?Functional mobility during ADLs: Modified independent;Rolling walker (2 wheels) ?General ADL Comments: Educated in multiple uses of 3 in 1.  ? ? ? ?Vision Ability to See in Adequate Light: 0 Adequate ?Patient Visual Report: No change from baseline ?   ?   ?Perception   ?  ?Praxis   ?  ? ?Pertinent Vitals/Pain Pain Assessment ?Pain Assessment: Faces ?Faces Pain Scale: Hurts little more ?Pain Location: LLE ?Pain Descriptors / Indicators:  Operative site guarding, Grimacing ?Pain Intervention(s): Monitored during session, Repositioned, Premedicated before session  ? ? ? ?Hand Dominance Right ?  ?Extremity/Trunk Assessment Upper Extremity Assessment ?Upper Extremity Assessment: Overall WFL for tasks assessed ?  ?Lower Extremity Assessment ?Lower Extremity Assessment: Defer to PT evaluation ?  ?Cervical / Trunk Assessment ?Cervical / Trunk Assessment:  Normal ?  ?Communication Communication ?Communication: No difficulties ?  ?Cognition Arousal/Alertness: Awake/alert ?Behavior During Therapy: [facility] for tasks assessed/performed ?Overall Cognitive Status: Within Functional Limits for tasks assessed ?  ?  ?  ?  ?  ?  ?  ?  ?  ?  ?  ?  ?  ?  ?  ?  ?  ?  ?  ?General Comments    ? ?  ?Exercises   ?  ?Shoulder Instructions    ? ? ?Home Living Family/patient expects to be discharged to:: Private residence ?Living Arrangements: Children (daughter and 2 grandchildren) ?Available Help at Discharge: Family;Available PRN/intermittently ?Type of Home: House ?Home Access: Stairs to enter ?Entrance Stairs-Number of Steps: 4 ?  ?Home Layout: One level ?  ?  ?Bathroom Shower/Tub: Walk-in shower ?  ?Bathroom Toilet: Standard ?  ?  ?Home Equipment: Grab bars - tub/shower ?  ?  ?  ? ?  ?Prior Functioning/Environment Prior Level of Function : Independent/Modified Independent ?  ?  ?  ?  ?  ?  ?  ?  ?  ? ?  ?  ?OT Problem List: Pain ?  ?   ?OT Treatment/Interventions:    ?  ?OT Goals(Current goals can be found in the care plan section)    ?OT Frequency:   ?  ? ?Co-evaluation   ?  ?  ?  ?  ? ?  ?AM-PAC OT "6 Clicks" Daily Activity     ?Outcome Measure Help from another person eating meals?: None ?Help from another person taking care of personal grooming?: None ?Help from another person toileting, which includes using toliet, bedpan, or urinal?: None ?Help from another person bathing (including washing, rinsing, drying)?: A Little ?Help from another person to put on and taking off regular upper body clothing?: None ?Help from another person to put on and taking off regular lower body clothing?: None ?6 Click Score: 23 ?  ?End of Session Equipment Utilized During Treatment: Rolling walker (2 wheels) ? ?Activity Tolerance: Patient tolerated treatment well ?Patient left: in bed;with call bell/phone within reach ? ?OT Visit Diagnosis: Pain  ?              ?Time: 1250-1307 ?OT Time Calculation  (min): 17 min ?Charges:  OT General Charges ?$OT Visit: 1 Visit ?OT Evaluation ?$OT Eval Low Complexity: 1 Low ? ?Nestor Lewandowsky, OTR/L ?Acute Rehabilitation Services ?Pager: [phone] ?Office: [phone]  ? ?Malka So ?07/19/2021, 1:37 PM ?

## 2021-07-19 NOTE — Evaluation (Signed)
Physical Therapy Evaluation and Discharge ?Patient Details ?Name: Kimberly Donaldson ?MRN: [id number] ?DOB: [birth year] ?Today's Date: 07/19/2021 ? ?History of Present Illness ? Pt is a 65 y.o. F who presents s/p left femoral to below knee popliteal bypass 07/18/2021. Significant PMH: DM, HTN.  ?Clinical Impression ? Patient evaluated by Physical Therapy with no further acute PT needs identified. Pt reporting fair pain control. Overall is mobilizing well; ambulating 200 ft with a Rollator and negotiated 2 steps to simulate home entrance. Pt with preference for RW over Rollator for increased external support. Education provided regarding activity recommendations and progression. All education has been completed and the patient has no further questions. See below for any follow-up Physical Therapy or equipment needs. PT is signing off. Thank you for this referral. ? ?   ? ?Recommendations for follow up therapy are one component of a multi-disciplinary discharge planning process, led by the attending physician.  Recommendations may be updated based on patient status, additional functional criteria and insurance authorization. ? ?Follow Up Recommendations No PT follow up ? ?  ?Assistance Recommended at Discharge PRN  ?Patient can return home with the following ? Assist for transportation;Help with stairs or ramp for entrance ? ?  ?Equipment Recommendations Rolling walker (2 wheels) (per pt request)  ?Recommendations for Other Services ?    ?  ?Functional Status Assessment Patient has had a recent decline in their functional status and demonstrates the ability to make significant improvements in function in a reasonable and predictable amount of time.  ? ?  ?Precautions / Restrictions Precautions ?Precautions: Fall ?Restrictions ?Weight Bearing Restrictions: No  ? ?  ? ?Mobility ? Bed Mobility ?Overal bed mobility: Modified Independent ?  ?  ?  ?  ?  ?  ?  ?  ? ?Transfers ?Overall transfer level: Modified independent ?Equipment  used: None ?  ?  ?  ?  ?  ?  ?  ?  ?  ? ?Ambulation/Gait ?Ambulation/Gait assistance: Modified independent (Device/Increase time) ?Gait Distance (Feet): 200 Feet ?Assistive device: Rollator (4 wheels), None ?Gait Pattern/deviations: Step-through pattern, Decreased stride length, Decreased stance time - left, Decreased weight shift to left, Antalgic ?Gait velocity: decreased ?  ?  ?General Gait Details: Improved step length with distance, pt initially ambulating with no AD, reaching out intermittently for single UE support. Improved balance and stride with AD vs without ? ?Stairs ?Stairs: Yes ?Stairs assistance: Min guard ?Stair Management: One rail Left ?Number of Stairs: 2 ?General stair comments: cues for sequencing/technique ? ?Wheelchair Mobility ?  ? ?Modified Rankin (Stroke Patients Only) ?  ? ?  ? ?Balance Overall balance assessment: Mild deficits observed, not formally tested ?  ?  ?  ?  ?  ?  ?  ?  ?  ?  ?  ?  ?  ?  ?  ?  ?  ?  ?   ? ? ? ?Pertinent Vitals/Pain Pain Assessment ?Pain Assessment: Faces ?Faces Pain Scale: Hurts little more ?Pain Location: LLE ?Pain Descriptors / Indicators: Operative site guarding, Grimacing ?Pain Intervention(s): Monitored during session  ? ? ?Home Living Family/patient expects to be discharged to:: Private residence ?Living Arrangements: Children (daughter and 2 grandchildren (36 months, 34 years old)) ?Available Help at Discharge: Family ?Type of Home: House ?Home Access: Stairs to enter ?  ?Entrance Stairs-Number of Steps: 4 (2 steps onto porch, then 2 steps into house) ?  ?Home Layout: One level ?Home Equipment: Grab bars - tub/shower ?   ?  ?  Prior Function Prior Level of Function : Independent/Modified Independent ?  ?  ?  ?  ?  ?  ?  ?  ?  ? ? ?Hand Dominance  ?   ? ?  ?Extremity/Trunk Assessment  ? Upper Extremity Assessment ?Upper Extremity Assessment: Defer to OT evaluation ?  ? ?Lower Extremity Assessment ?Lower Extremity Assessment: RLE deficits/detail;LLE  deficits/detail ?RLE Deficits / Details: Strength 5/5 ?LLE Deficits / Details: At least 3+/5 strength ?  ? ?Cervical / Trunk Assessment ?Cervical / Trunk Assessment: Normal  ?Communication  ? Communication: No difficulties  ?Cognition Arousal/Alertness: Awake/alert ?Behavior During Therapy: [facility] for tasks assessed/performed ?Overall Cognitive Status: Within Functional Limits for tasks assessed ?  ?  ?  ?  ?  ?  ?  ?  ?  ?  ?  ?  ?  ?  ?  ?  ?  ?  ?  ? ?  ?General Comments   ? ?  ?Exercises General Exercises - Lower Extremity ?Long Arc Quad: Left, 10 reps, Seated  ? ?Assessment/Plan  ?  ?PT Assessment Patient does not need any further PT services  ?PT Problem List   ? ?   ?  ?PT Treatment Interventions     ? ?PT Goals (Current goals can be found in the Care Plan section)  ?Acute Rehab PT Goals ?Patient Stated Goal: less pain ?PT Goal Formulation: All assessment and education complete, DC therapy ? ?  ?Frequency   ?  ? ? ?Co-evaluation   ?  ?  ?  ?  ? ? ?  ?AM-PAC PT "6 Clicks" Mobility  ?Outcome Measure Help needed turning from your back to your side while in a flat bed without using bedrails?: None ?Help needed moving from lying on your back to sitting on the side of a flat bed without using bedrails?: None ?Help needed moving to and from a bed to a chair (including a wheelchair)?: None ?Help needed standing up from a chair using your arms (e.g., wheelchair or bedside chair)?: None ?Help needed to walk in hospital room?: None ?Help needed climbing 3-5 steps with a railing? : A Little ?6 Click Score: 23 ? ?  ?End of Session   ?Activity Tolerance: Patient tolerated treatment well ?Patient left: in chair;with call bell/phone within reach ?Nurse Communication: Mobility status ?PT Visit Diagnosis: Other abnormalities of gait and mobility (R26.89);Pain ?Pain - Right/Left: Left ?Pain - part of body: Leg ?  ? ?Time: 5573-2202 ?PT Time Calculation (min) (ACUTE ONLY): 30 min ? ? ?Charges:   PT Evaluation ?$PT Eval Low  Complexity: 1 Low ?PT Treatments ?$Gait Training: 8-22 mins ?  ?   ? ? ?Wyona Almas, PT, DPT ?Acute Rehabilitation Services ?Pager [phone] ?Office [phone] ? ? ?Deno Etienne ?07/19/2021, 8:49 AM ? ?

## 2021-07-19 NOTE — Progress Notes (Signed)
? ?  VASCULAR SURGERY ASSESSMENT & PLAN:  ? ?POD 1 S/P  LEFT FEMORAL TO BELOW-KNEE POPLITEAL ARTERY BYPASS (VEIN): Her bypass graft is patent.  She has excellent Doppler signals in the left foot.  Continue daily dressing changes to her left calf wound.  Her Arletha Pili has a good seal.  She will go home with that. ? ?VASCULAR QUALITY INITIATIVE: She is on aspirin and a statin. ? ?DVT PROPHYLAXIS: She is on subcu heparin. ? ?Anticipate discharge tomorrow. ? ?SUBJECTIVE:  ? ?No complaints this morning.  ? ?PHYSICAL EXAM:  ? ?Vitals:  ? 07/18/21 1600 07/18/21 1710 07/18/21 2000 07/19/21 0000  ?BP: 124/61 126/64 (!) 134/59 (!) 128/52  ?Pulse: 68  63 (!) 54  ?Resp: '20 13 18 16  '$ ?Temp: 98 ?F (36.7 ?C)  97.7 ?F (36.5 ?C) 98.6 ?F (37 ?C)  ?TempSrc: Oral  Oral Oral  ?SpO2: 98%  94% 92%  ?Weight:      ?Height:      ? ?Good Doppler signals in the left foot in the dorsalis pedis and posterior tibial positions.   ?Her incisions look fine.   ?Her Arletha Pili has a good seal. ? ? ?LABS:  ? ?Lab Results  ?Component Value Date  ? WBC 12.7 (H) 07/19/2021  ? HGB 11.7 (L) 07/19/2021  ? HCT 34.4 (L) 07/19/2021  ? MCV 87.8 07/19/2021  ? PLT 286 07/19/2021  ? ?Lab Results  ?Component Value Date  ? CREATININE 0.84 07/19/2021  ? ?Lab Results  ?Component Value Date  ? INR 1.0 07/12/2021  ? ?CBG (last 3)  ?Recent Labs  ?  07/18/21 ?8295 07/18/21 ?2121 07/19/21 ?0613  ?GLUCAP 127* 162* 112*  ? ? ?PROBLEM LIST:   ? ?Principal Problem: ?  PAD (peripheral artery disease) ([place]) ? ? ?CURRENT MEDS:  ? ? amLODipine  5 mg Oral Daily  ? aspirin EC  81 mg Oral Daily  ? buPROPion  150 mg Oral Daily  ? docusate sodium  100 mg Oral Daily  ? heparin  5,000 Units Subcutaneous Q8H  ? lisinopril  40 mg Oral Daily  ? pantoprazole  40 mg Oral Daily  ? rosuvastatin  5 mg Oral Daily  ? ? ?Kimberly Donaldson ?Office: [phone] ?07/19/2021 ? ?

## 2021-07-19 NOTE — Anesthesia Postprocedure Evaluation (Signed)
Anesthesia Post Note ? ?Patient: MILLICENT BLAZEJEWSKI ? ?Procedure(s) Performed: LEFT FEMORAL-POPLITEAL ARTERY BYPASS WITH VEIN (Left) ?INTRA OPERATIVE ARTERIOGRAM (Left) ? ?  ? ?Patient location during evaluation: PACU ?Anesthesia Type: General ?Level of consciousness: awake and alert ?Pain management: pain level controlled ?Vital Signs Assessment: post-procedure vital signs reviewed and stable ?Respiratory status: spontaneous breathing, nonlabored ventilation, respiratory function stable and patient connected to nasal cannula oxygen ?Cardiovascular status: blood pressure returned to baseline and stable ?Postop Assessment: no apparent nausea or vomiting ?Anesthetic complications: no ? ? ?No notable events documented. ? ?Last Vitals:  ?Vitals:  ? 07/18/21 2000 07/19/21 0000  ?BP: (!) 134/59 (!) 128/52  ?Pulse: 63 (!) 54  ?Resp: 18 16  ?Temp: 36.5 ?C 37 ?C  ?SpO2: 94% 92%  ?  ?Last Pain:  ?Vitals:  ? 07/19/21 0110  ?TempSrc:   ?PainSc: 1   ? ? ?  ?  ?  ?  ?  ?  ? ?Santa Lighter ? ? ? ? ?

## 2021-07-19 NOTE — Progress Notes (Signed)
Mobility Specialist Progress Note ? ? 07/19/21 1245  ?Mobility  ?Activity Dangled on edge of bed  ?Level of Assistance Independent after set-up  ?Assistive Device None  ?Activity Response Tolerated well  ?$Mobility charge 1 Mobility  ? ?Pt declining to sit in chair claiming they sat up all morning but agreeable to sitting EOB for lunch today. No physical assistance needed but min required for technique and positioning. No complaints during transition. Left w/ food tray in front and call bell by side.  ? ?Holland Falling ?Mobility Specialist ?Phone Number [phone] ? ?

## 2021-07-20 DIAGNOSIS — R531 Weakness: Secondary | ICD-10-CM | POA: Diagnosis not present

## 2021-07-20 LAB — GLUCOSE, CAPILLARY: Glucose-Capillary: 108 mg/dL — ABNORMAL HIGH (ref 70–99)

## 2021-07-20 MED ORDER — SILVER SULFADIAZINE 1 % EX CREA: TOPICAL_CREAM | CUTANEOUS | 1 refills | Status: DC

## 2021-07-20 MED ORDER — OXYCODONE-ACETAMINOPHEN 5-325 MG PO TABS
1.0000 | ORAL_TABLET | ORAL | 0 refills | Status: DC | PRN
Start: 2021-07-20 — End: 2021-11-02

## 2021-07-20 NOTE — Progress Notes (Signed)
? ?  VASCULAR SURGERY ASSESSMENT & PLAN:  ? ?POD 2 S/P  LEFT FEMORAL TO BELOW-KNEE POPLITEAL ARTERY BYPASS (VEIN): Her bypass graft is patent.  She has excellent Doppler signals in the left foot.  Continue daily dressing changes to her left calf wound.  Her Arletha Pili has a good seal.  She will go home with that. ?  ?VASCULAR QUALITY INITIATIVE: She is on aspirin and a statin. ?  ?DVT PROPHYLAXIS: She is on subcu heparin. ?  ?Anticipate discharge later today. ? ?SUBJECTIVE:  ? ?No specific complaints.  She is a little hesitant to go home as she is alone.  She would like to see how she does this morning before making a decision. ? ?PHYSICAL EXAM:  ? ?Vitals:  ? 07/19/21 1619 07/19/21 2011 07/19/21 2331 07/20/21 0405  ?BP:  (!) 116/35 (!) 120/42 (!) 148/70  ?Pulse: 69 (!) 55 60 63  ?Resp:  '20 16 20  '$ ?Temp: 98.2 ?F (36.8 ?C) 98.5 ?F (36.9 ?C) 98.3 ?F (36.8 ?C) 98.3 ?F (36.8 ?C)  ?TempSrc: Oral Oral Oral Oral  ?SpO2:  95% 100% 95%  ?Weight:      ?Height:      ? ?Brisk Doppler signals in the left foot. ?All of her incisions look fine. ?The Arletha Pili has a good seal. ? ?LABS:  ? ?Lab Results  ?Component Value Date  ? WBC 12.7 (H) 07/19/2021  ? HGB 11.7 (L) 07/19/2021  ? HCT 34.4 (L) 07/19/2021  ? MCV 87.8 07/19/2021  ? PLT 286 07/19/2021  ? ?Lab Results  ?Component Value Date  ? CREATININE 0.84 07/19/2021  ? ?Lab Results  ?Component Value Date  ? INR 1.0 07/12/2021  ? ?CBG (last 3)  ?Recent Labs  ?  07/19/21 ?1133 07/19/21 ?1619 07/19/21 ?2102  ?GLUCAP 114* 126* 128*  ? ? ?PROBLEM LIST:   ? ?Principal Problem: ?  PAD (peripheral artery disease) ([place]) ? ? ?CURRENT MEDS:  ? ? amLODipine  5 mg Oral Daily  ? aspirin EC  81 mg Oral Daily  ? buPROPion  150 mg Oral Daily  ? docusate sodium  100 mg Oral Daily  ? heparin  5,000 Units Subcutaneous Q8H  ? lisinopril  40 mg Oral Daily  ? pantoprazole  40 mg Oral Daily  ? rosuvastatin  5 mg Oral Daily  ? ? ?Kimberly Donaldson ?Office: [phone] ?07/20/2021 ? ?

## 2021-07-20 NOTE — Progress Notes (Signed)
Mobility Specialist Progress Note ? ? 07/20/21 1200  ?Mobility  ?Activity Ambulated with assistance in hallway  ?Level of Assistance Standby assist, set-up cues, supervision of patient - no hands on  ?Assistive Device Front wheel walker  ?Distance Ambulated (ft) 220 ft  ?Activity Response Tolerated well  ?$Mobility charge 1 Mobility  ? ?Pre Mobility: 63 HR, 137/50 BP ?During Mobility: 67 HR ?Post Mobility: 53 HR ? ?Received in bed having slight pain(3/10) at incisional site but agreeable. Stated pain increases to a 10/10 when ambulating but feels better when meds are scheduled before. x1 standing break d/t dizziness but pt declining to sit down. Returned back to bed w/o fault and pt feeling better. Left call bell in reach. ? ?Holland Falling ?Mobility Specialist ?Phone Number [phone] ? ?

## 2021-07-20 NOTE — TOC Transition Note (Signed)
Transition of Care (TOC) - CM/SW Discharge Note ?Marvetta Gibbons Therapist, sports, BSN ?Transitions of Care ?Unit 4E- RN Case Manager ?See Treatment Team for direct phone #  ? ? ?Patient Details  ?Name: Kimberly Donaldson ?MRN: [id number] ?Date of Birth: 03-17-1956 ? ?Transition of Care (TOC) CM/SW Contact:  ?Dahlia Client, Romeo Rabon, RN ?Phone Number: ?07/20/2021, 1:51 PM ? ? ?Clinical Narrative:    ?Pt stable for transition home today w/ daughter. PT/OT recommendations for RW/3n1- CM spoke with pt and her daughter at the bedside. Explained Medicare coverage and potential noncoverage for 3n1 as well as current out of pocket cost for 3n1 with in house provider. Pt and daughter agreeable to use in house provider for DME needs.  ?Pt would like to go ahead with orders for DME and see if insurance covers- daughter says they will think about 3n1 and paying for it out of pocket- will have [employer] call them at bedside for final decision.  ?Orders have been placed for DME- RW and 3n1 ? ?Call made to [employer] for DME referral- once processed they will call pt/daughter at bedside regarding coverage/cost for 3n1- DME to be delivered to room prior to discharge.  ? ?Daughter to transport home.  ? ? ?Final next level of care: Home/Self Care ?Barriers to Discharge: No Barriers Identified ? ? ?Patient Goals and CMS Choice ?Patient states their goals for this hospitalization and ongoing recovery are:: return home ?CMS Medicare.gov Compare Post Acute Care list provided to:: Patient ?Choice offered to / list presented to : Patient, Adult Children ? ?Discharge Placement ?  ?           ? Home ?  ?  ? ?Discharge Plan and Services ?  ?Discharge Planning Services: CM Consult ?Post Acute Care Choice: Durable Medical Equipment          ?DME Arranged: 3-N-1, Walker rolling ?DME Agency: [employer] ?Date DME Agency Contacted: 07/20/21 ?Time DME Agency Contacted: 1330 ?Representative spoke with at DME Agency: Frazier Butt ?HH Arranged: NA ?[place] Agency: NA ?  ?  ?  ? ?Social  Determinants of Health (SDOH) Interventions ?  ? ? ?Readmission Risk Interventions ? ?  07/20/2021  ?  1:51 PM  ?Readmission Risk Prevention Plan  ?Post Dischage Appt Complete  ?Medication Screening Complete  ?Transportation Screening Complete  ? ? ? ? ? ?

## 2021-07-20 NOTE — Discharge Instructions (Addendum)
 [facility]  Discharge instructions  Lower Extremity Bypass Surgery  Please refer to the following instruction for your post-procedure care. Your surgeon or physician assistant will discuss any changes with you.  Activity  You are encouraged to walk as much as you can. You can slowly return to normal activities during the month after your surgery. Avoid strenuous activity and heavy lifting until your doctor tells you it's OK. Avoid activities such as vacuuming or swinging a golf club. Do not drive until your doctor give the OK and you are no longer taking prescription pain medications. It is also normal to have difficulty with sleep habits, eating and bowel movement after surgery. These will go away with time.  Bathing/Showering  Shower daily after you go home. Do not soak in a bathtub, hot tub, or swim until the incision heals completely.  Incision Care  Clean your incision with mild soap and water. Shower every day. Pat the area dry with a clean towel. You do not need a bandage unless otherwise instructed. Do not apply any ointments or creams to your incision. If you have open wounds you will be instructed how to care for them or a visiting nurse may be arranged for you. If you have staples or sutures along your incision they will be removed at your post-op appointment. You may have skin glue on your incision. Do not peel it off. It will come off on its own in about one week.  Keep Pravena wound vac on your groin incision until it loses it seal in about 7-10 days.  Once that happens, you can remove and then wash the groin wound with soap and water daily and pat dry. (No tub bath-only shower)  Then put a dry gauze or washcloth in the groin to keep this area dry to help prevent wound infection.  Do this daily and as needed.  Do not use Vaseline or neosporin on your incisions.  Only use soap and water on your incisions and then protect and keep  dry.   Diet  Resume your normal diet. There are no special food restrictions following this procedure. A low fat/ low cholesterol diet is recommended for all patients with vascular disease. In order to heal from your surgery, it is CRITICAL to get adequate nutrition. Your body requires vitamins, minerals, and protein. Vegetables are the best source of vitamins and minerals. Vegetables also provide the perfect balance of protein. Processed food has little nutritional value, so try to avoid this.  Medications  Resume taking all your medications unless your doctor or physician assistant tells you not to. If your incision is causing pain, you may take over-the-counter pain relievers such as acetaminophen (Tylenol). If you were prescribed a stronger pain medication, please aware these medication can cause nausea and constipation. Prevent nausea by taking the medication with a snack or meal. Avoid constipation by drinking plenty of fluids and eating foods with high amount of fiber, such as fruits, vegetables, and grains. Take Colace 100 mg (an over-the-counter stool softener) twice a day as needed for constipation.  Do not take Tylenol if you are taking prescription pain medications.  Follow Up  Our office will schedule a follow up appointment 2-3 weeks following discharge.  Please call us immediately for any of the following conditions  Severe or worsening pain in your legs or feet while at rest or while walking Increase pain, redness, warmth, or drainage (pus) from your incision site(s) Fever of 101   degree or higher The swelling in your leg with the bypass suddenly worsens and becomes more painful than when you were in the hospital If you have been instructed to feel your graft pulse then you should do so every day. If you can no longer feel this pulse, call the office immediately. Not all patients are given this instruction.  Leg swelling is common after leg bypass surgery.  The swelling should  improve over a few months following surgery. To improve the swelling, you may elevate your legs above the level of your heart while you are sitting or resting. Your surgeon or physician assistant may ask you to apply an ACE wrap or wear compression (TED) stockings to help to reduce swelling.  Reduce your risk of vascular disease  Stop smoking. If you would like help call [facility] at 1-800-QUIT-NOW (1-800-784-8669) or [place] at 336-586-4000.  Manage your cholesterol Maintain a desired weight Control your diabetes weight Control your diabetes Keep your blood pressure down  If you have any questions, please call the office at [phone]  

## 2021-07-20 NOTE — Progress Notes (Signed)
Patient given discharge instructions and stated understanding. 

## 2021-07-20 NOTE — Plan of Care (Signed)

## 2021-07-21 NOTE — Discharge Summary (Signed)
?Bypass Discharge Summary ?Patient ID: ?Kimberly Donaldson ?[id number] ?66 y.o. ?1955-11-25 ? ?Admit date: 07/18/2021 ? ?Discharge date and time: 07/20/2021  3:06 PM  ? ?Admitting Physician: Angelia Mould, MD  ? ?Discharge Physician: Angelia Mould, MD ? ?Admission Diagnoses: PAD (peripheral artery disease) ([place]) [I73.9] ? ?Discharge Diagnoses: PAD ? ?Admission Condition: fair ? ?Discharged Condition: good ? ?Indication for Admission: Kimberly Donaldson is a 66 y.o. female who presented with a nonhealing wound on the posterior aspect of her left calf.  She had evidence of significant infrainguinal arterial occlusive disease.  She underwent an arteriogram which showed a long segment occlusion of the superficial femoral artery.  I felt that her best option for limb salvage was left femoral to below-knee popliteal artery bypass. ? ?Hospital Course: [name] was admitted on 07/18/21 and underwent left femoral to below knee popliteal artery bypass with vein, intraoperative arteriogram by Dr. Scot Dock. She tolerated the procedure well and was taken to the recovery room in stable condition.  ? ?By POD #1, her left lower extremity remained well perfused and warm with doppler Dp and PT signals. Incisions intact and well appearing. Pravena wound VAC to left groin with good suction. She remained hemodynamically stable. Wound care to left calf. Continued on Aspirin and statin. Cleared by PT and OT.  ? ?By POD#2, she remained stable for discharge. Her left lower extremity bypass patent with doppler signals. Incisions intact and well appearing. Prevena vac with good suction. Provided instructions on incision and Prevena care. She will go home on Aspirin and statin. Instructed to resume all other home medications as prescribed. PDMP was reviewed and pain medication sent to patients pharmacy. Tolerated mobilizing with mobility specialist. Requested 3 n1 and rolling walker. She has follow up arranged in 2 weeks for incision check.   ? ?Consults: None ? ?Treatments: analgesia: acetaminophen, Dilaudid, and Oxycodone-Acetaminophen, therapies: PT and OT, and surgery: left femoral to below knee popliteal artery bypass with vein, intraoperative arteriogram ? ? ?Disposition: Discharge disposition: 01-Home or Self Care ? ? ? ? ? ? ?- For [facility] Registry use --- ? ?Post-op:  ?Wound infection: No  ?Graft infection: No  ?Transfusion: No  If yes, 0 units given ?New Arrhythmia: No ?Patency judged by: [ X] Dopper only, '[ ]'$  Palpable graft pulse, '[ ]'$  Palpable distal pulse, '[ ]'$  ABI inc. > 0.15, '[ ]'$  Duplex ?D/C Ambulatory Status: Ambulatory ? ?Complications: ?MI: [ X] No, '[ ]'$  Troponin only, '[ ]'$  EKG or Clinical ?CHF: No ?Resp failure: [ X] none, '[ ]'$  Pneumonia, '[ ]'$  Ventilator ?Chg in renal function: [ X] none, '[ ]'$  Inc. Cr > 0.5, '[ ]'$  Temp. Dialysis, '[ ]'$  Permanent dialysis ?Stroke: [facility] ] None, '[ ]'$  Minor, '[ ]'$  Major ?Return to OR: No  ?Reason for return to OR: '[ ]'$  Bleeding, '[ ]'$  Infection, '[ ]'$  Thrombosis, '[ ]'$  Revision ? ?Discharge medications: ?Statin use:  Yes ?ASA use:  Yes ?Plavix use:  No  for medical reason not indicated ?Beta blocker use: No  for medical reason not indicated ?Coumadin use: No  for medical reason not indicated  ? ? ? ?Patient Instructions:  ?Allergies as of 07/20/2021   ? ?   Reactions  ? Codeine   ? Ringing in ears  ? Morphine Hives  ? Other Hives  ? Sporanox - for nail fungus  ? ?  ? ?  ?Medication List  ?  ? ?TAKE these medications   ? ?AIRBORNE PO ?Take 1 tablet  by mouth daily. ?  ?amLODipine 5 MG tablet ?Commonly known as: NORVASC ?Take 1 tablet (5 mg total) by mouth daily. ?  ?aspirin EC 81 MG tablet ?Take 81 mg by mouth daily. Swallow whole. ?  ?baclofen 10 MG tablet ?Commonly known as: LIORESAL ?Take 1 tablet (10 mg total) by mouth 3 (three) times daily. ?What changed:  ?when to take this ?reasons to take this ?  ?buPROPion 150 MG 24 hr tablet ?Commonly known as: Wellbutrin XL ?Take 1 tablet (150 mg total) by mouth daily. ?  ?Fish Oil 1000 MG  Caps ?Take 1,000 mg by mouth daily. ?  ?ibuprofen 200 MG tablet ?Commonly known as: ADVIL ?Take 800 mg by mouth every 6 (six) hours as needed for moderate pain. ?  ?lisinopril 20 MG tablet ?Commonly known as: ZESTRIL ?Take 2 tablets (40 mg total) by mouth daily. ?  ?loperamide 2 MG capsule ?Commonly known as: IMODIUM ?Take 2 mg by mouth as needed for diarrhea or loose stools. ?  ?oxyCODONE-acetaminophen 5-325 MG tablet ?Commonly known as: PERCOCET/ROXICET ?Take 1-2 tablets by mouth every 4 (four) hours as needed for moderate pain. ?  ?Ozempic (0.25 or 0.5 MG/DOSE) 2 MG/1.5ML Sopn ?Generic drug: Semaglutide(0.25 or 0.'5MG'$ /DOS) ?Inject 0.5 mg into the skin once a week. ?What changed: when to take this ?  ?rosuvastatin 5 MG tablet ?Commonly known as: Crestor ?Take 1 tablet (5 mg total) by mouth daily. ?  ?silver sulfADIAZINE 1 % cream ?Commonly known as: SSD ?Apply 1 application. topically daily. ?What changed: Another medication with the same name was added. Make sure you understand how and when to take each. ?  ?silver sulfADIAZINE 1 % cream ?Commonly known as: SILVADENE ?Apply to affected area daily ?What changed: You were already taking a medication with the same name, and this prescription was added. Make sure you understand how and when to take each. ?  ?sulfamethoxazole-trimethoprim 800-160 MG tablet ?Commonly known as: BACTRIM DS ?Take 1 tablet by mouth 2 (two) times daily. ?Notes to patient: Complete the remaining medication course (if any remaining) then stop taking.  ?  ?tobramycin 0.3 % ophthalmic solution ?Commonly known as: TOBREX ?Place 1 drop into both eyes See admin instructions. Used before eye injections every 3 months ?  ? ?  ? ?Activity: activity as tolerated, no driving while on analgesics, and no heavy lifting for 6 weeks ?Diet: regular diet and low fat, low cholesterol diet ?Wound Care: keep wound clean and dry and wash with mild soap and water, pat dry. Do not soak in bathtub.  ? ?Keep Pravena  wound vac on your groin incision until it loses it seal in about 7-10 days.  Once that happens, you can remove and then wash the groin wound with soap and water daily and pat dry. (No tub bath-only shower)  Then put a dry gauze or washcloth in the groin to keep this area dry to help prevent wound infection.  Do this daily and as needed.  Do not use Vaseline or neosporin on your incisions.  Only use soap and water on your incisions and then protect and keep dry. ? ?Follow-up with Dr. Scot Dock in 2 weeks. ? ?Signed: ?Wende Longstreth ?07/21/2021 ?12:44 PM ?

## 2021-07-28 ENCOUNTER — Ambulatory Visit (INDEPENDENT_AMBULATORY_CARE_PROVIDER_SITE_OTHER): Payer: Medicare HMO | Admitting: Family Medicine

## 2021-07-28 ENCOUNTER — Encounter: Payer: Self-pay | Admitting: Family Medicine

## 2021-07-28 VITALS — BP 107/57 | HR 61 | Temp 98.0°F | Resp 20 | Ht 64.0 in | Wt 189.0 lb

## 2021-07-28 DIAGNOSIS — L98499 Non-pressure chronic ulcer of skin of other sites with unspecified severity: Secondary | ICD-10-CM | POA: Diagnosis not present

## 2021-07-28 DIAGNOSIS — Z716 Tobacco abuse counseling: Secondary | ICD-10-CM

## 2021-07-28 DIAGNOSIS — I771 Stricture of artery: Secondary | ICD-10-CM

## 2021-07-28 DIAGNOSIS — Z23 Encounter for immunization: Secondary | ICD-10-CM

## 2021-07-28 MED ORDER — CHANTIX STARTING MONTH PAK 0.5 MG X 11 & 1 MG X 42 PO TBPK: ORAL_TABLET | ORAL | 0 refills | Status: AC

## 2021-07-28 MED ORDER — VARENICLINE TARTRATE 1 MG PO TABS: 1.0000 mg | ORAL_TABLET | Freq: Two times a day (BID) | ORAL | 2 refills | Status: DC

## 2021-07-28 NOTE — Addendum Note (Signed)
Addended by: Rolena Infante on: 07/28/2021 03:48 PM ? ? Modules accepted: Orders ? ?

## 2021-07-28 NOTE — Progress Notes (Signed)
? ?  BP (!) 107/57   Pulse 61   Temp 98 ?F (36.7 ?C) (Temporal)   Resp 20   Ht '5\' 4"'$  (1.626 m)   Wt 189 lb (85.7 kg)   SpO2 97%   BMI 32.44 kg/m?   ? ?Subjective:  ? ?Patient ID: Kimberly Donaldson, female    DOB: [birth year], 66 y.o.   MRN: [id number] ? ?HPI: ?Kimberly Donaldson is a 66 y.o. female presenting on 07/28/2021 for Left lower leg wound recheck ? ? ?HPI ?Left lower leg wound ?Patient was seen just over a month ago for left lower leg wound and since then has had bypass in that leg to restore blood flow.  She was treated with initially with antibiotics but redness and warmth is improved.  She has been using Silvadene cream and wrapping it and feels like it is improving but just not completely getting better. ? ?She is also wanting smoking cessation and recently had a bypass in that leg to restore blood flow. ? ?Relevant past medical, surgical, family and social history reviewed and updated as indicated. Interim medical history since our last visit reviewed. ?Allergies and medications reviewed and updated. ? ?Review of Systems  ?Constitutional:  Negative for chills and fever.  ?Eyes:  Negative for visual disturbance.  ?Respiratory:  Negative for chest tightness and shortness of breath.   ?Cardiovascular:  Positive for leg swelling (1+ edema in left lower extremity). Negative for chest pain.  ?Musculoskeletal:  Negative for back pain and gait problem.  ?Skin:  Positive for wound. Negative for color change and rash.  ?Neurological:  Negative for light-headedness and headaches.  ?Psychiatric/Behavioral:  Negative for agitation and behavioral problems.   ?All other systems reviewed and are negative. ? ?Per HPI unless specifically indicated above ? ? ? ? ? ? ?Objective:  ? ?BP (!) 107/57   Pulse 61   Temp 98 ?F (36.7 ?C) (Temporal)   Resp 20   Ht '5\' 4"'$  (1.626 m)   Wt 189 lb (85.7 kg)   SpO2 97%   BMI 32.44 kg/m?   ?Wt Readings from Last 3 Encounters:  ?07/28/21 189 lb (85.7 kg)  ?07/18/21 190 lb (86.2 kg)   ?07/12/21 189 lb 11.2 oz (86 kg)  ?  ?Physical Exam ?Vitals and nursing note reviewed.  ?Skin: ?   General: Skin is warm and dry.  ?   Findings: Wound present.  ? ?    ? ? ? ?Continue with Silvadene cream changes daily and cover with gauze use Ace bandages to create compression on the left lower extremity. ?Assessment & Plan:  ? ?Problem List Items Addressed This Visit   ?None ?Visit Diagnoses   ? ? Arterial insufficiency with ischemic ulcer ([place])    -  Primary  ? Encounter for smoking cessation counseling      ? Relevant Medications  ? Varenicline Tartrate, Starter, (CHANTIX STARTING MONTH PAK) 0.5 MG X 11 & 1 MG X 42 TBPK  ? varenicline (CHANTIX CONTINUING MONTH PAK) 1 MG tablet  ? ?  ?  ? ?Follow up plan: ?Return if symptoms worsen or fail to improve, for 2 to 3-week wound recheck. ? ?Counseling provided for all of the vaccine components ?No orders of the defined types were placed in this encounter. ? ? ?Caryl Pina, MD ?[place] ?07/28/2021, 2:44 PM ? ? ?  ?

## 2021-08-02 DIAGNOSIS — H43813 Vitreous degeneration, bilateral: Secondary | ICD-10-CM | POA: Diagnosis not present

## 2021-08-02 DIAGNOSIS — E113312 Type 2 diabetes mellitus with moderate nonproliferative diabetic retinopathy with macular edema, left eye: Secondary | ICD-10-CM | POA: Diagnosis not present

## 2021-08-02 DIAGNOSIS — E113411 Type 2 diabetes mellitus with severe nonproliferative diabetic retinopathy with macular edema, right eye: Secondary | ICD-10-CM | POA: Diagnosis not present

## 2021-08-03 ENCOUNTER — Ambulatory Visit (INDEPENDENT_AMBULATORY_CARE_PROVIDER_SITE_OTHER): Payer: Medicare HMO | Admitting: Physician Assistant

## 2021-08-03 VITALS — BP 152/67 | HR 64 | Temp 98.1°F | Resp 20 | Ht 64.0 in | Wt 186.7 lb

## 2021-08-03 DIAGNOSIS — I739 Peripheral vascular disease, unspecified: Secondary | ICD-10-CM

## 2021-08-03 NOTE — Progress Notes (Signed)
POST OPERATIVE OFFICE NOTE    CC:  F/u for surgery  HPI:  This is a 66 y.o. female who is s/p left femoral to below the knee popliteal artery bypass with vein on 07/18/2021 by Dr. Scot Dock due to distal left posterior calf ulceration.  Patient states the ulceration is improving since surgery.  She believes all incisions are well-healed.  She denies claudication, rest pain, any further wounds of bilateral lower extremities.  She is on aspirin and a statin daily.  Allergies  Allergen Reactions   Codeine     Ringing in ears   Morphine Hives   Other Hives    Sporanox - for nail fungus    Current Outpatient Medications  Medication Sig Dispense Refill   amLODipine (NORVASC) 5 MG tablet Take 1 tablet (5 mg total) by mouth daily. 90 tablet 3   aspirin EC 81 MG tablet Take 81 mg by mouth daily. Swallow whole.     baclofen (LIORESAL) 10 MG tablet Take 1 tablet (10 mg total) by mouth 3 (three) times daily. (Patient taking differently: Take 10 mg by mouth 3 (three) times daily as needed for muscle spasms.) 30 each 0   buPROPion (WELLBUTRIN XL) 150 MG 24 hr tablet Take 1 tablet (150 mg total) by mouth daily. 30 tablet 2   ibuprofen (ADVIL,MOTRIN) 200 MG tablet Take 800 mg by mouth every 6 (six) hours as needed for moderate pain.     lisinopril (ZESTRIL) 20 MG tablet Take 2 tablets (40 mg total) by mouth daily. 180 tablet 3   loperamide (IMODIUM) 2 MG capsule Take 2 mg by mouth as needed for diarrhea or loose stools.     Multiple Vitamins-Minerals (AIRBORNE PO) Take 1 tablet by mouth daily.     Omega-3 Fatty Acids (FISH OIL) 1000 MG CAPS Take 1,000 mg by mouth daily.     oxyCODONE-acetaminophen (PERCOCET/ROXICET) 5-325 MG tablet Take 1-2 tablets by mouth every 4 (four) hours as needed for moderate pain. 30 tablet 0   rosuvastatin (CRESTOR) 5 MG tablet Take 1 tablet (5 mg total) by mouth daily. 90 tablet 3   Semaglutide,0.25 or 0.'5MG'$ /DOS, (OZEMPIC, 0.25 OR 0.5 MG/DOSE,) 2 MG/1.5ML SOPN Inject 0.5 mg  into the skin once a week. (Patient taking differently: Inject 0.5 mg into the skin every Monday.) 4.5 mL 3   silver sulfADIAZINE (SSD) 1 % cream Apply 1 application. topically daily. 50 g 0   tobramycin (TOBREX) 0.3 % ophthalmic solution Place 1 drop into both eyes See admin instructions. Used before eye injections every 3 months     varenicline (CHANTIX CONTINUING MONTH PAK) 1 MG tablet Take 1 tablet (1 mg total) by mouth 2 (two) times daily. 60 tablet 2   Varenicline Tartrate, Starter, (CHANTIX STARTING MONTH PAK) 0.5 MG X 11 & 1 MG X 42 TBPK Take 0.5 mg by mouth daily for 3 days, THEN 0.5 mg 2 (two) times daily for 4 days, THEN 1 mg 2 (two) times daily for 23 days. 42 each 0   No current facility-administered medications for this visit.     ROS:  See HPI  Physical Exam:  Vitals:   08/03/21 0858  BP: (!) 152/67  Pulse: 64  Resp: 20  Temp: 98.1 F (36.7 C)  TempSrc: Temporal  SpO2: 100%  Weight: 186 lb 11.2 oz (84.7 kg)  Height: '5\' 4"'$  (1.626 m)    Incision: Left groin incision is well-healed; vein harvest incisions well-healed; below the knee incision with some surrounding erythema  however no purulent drainage or dehiscence Extremities: Palpable left DP and AT pulse  Assessment/Plan:  This is a 66 y.o. female who is s/p: Left femoral to popliteal bypass with vein due to posterior ankle ulceration  -Left foot is well-perfused with a palpable PT and AT pulse -Subjectively left leg ulcer is improving since time of surgery -All incisions appear to be healing well, I recommended a warm compress for the below the knee incision given some local swelling and surrounding erythema.  Index of suspicion is low for infection given the use of vein as a conduit for bypass -Continue aspirin and statin daily -Patient will return to office in 3 months for bypass surveillance.  She knows to call/return office sooner with any questions or concerns   Dagoberto Ligas, PA-C [facility] [phone]  Clinic MD:  Scot Dock

## 2021-08-08 ENCOUNTER — Other Ambulatory Visit: Payer: Self-pay | Admitting: *Deleted

## 2021-08-08 DIAGNOSIS — I739 Peripheral vascular disease, unspecified: Secondary | ICD-10-CM

## 2021-08-18 ENCOUNTER — Ambulatory Visit (INDEPENDENT_AMBULATORY_CARE_PROVIDER_SITE_OTHER): Payer: Medicare HMO | Admitting: Family Medicine

## 2021-08-18 ENCOUNTER — Encounter: Payer: Self-pay | Admitting: Family Medicine

## 2021-08-18 VITALS — BP 132/54 | HR 56 | Temp 98.0°F | Ht 64.0 in | Wt 187.0 lb

## 2021-08-18 DIAGNOSIS — S81801D Unspecified open wound, right lower leg, subsequent encounter: Secondary | ICD-10-CM | POA: Diagnosis not present

## 2021-08-18 NOTE — Progress Notes (Signed)
BP (!) 132/54   Pulse (!) 56   Temp 98 F (36.7 C)   Ht '5\' 4"'$  (1.626 m)   Wt 187 lb (84.8 kg)   SpO2 100%   BMI 32.10 kg/m    Subjective:   Patient ID: Kimberly Donaldson, female    DOB: Jan 24, 1956, 66 y.o.   MRN: [id number]  HPI: Kimberly Donaldson is a 66 y.o. female presenting on 08/18/2021 for Wound Check (LLE)   HPI Wound care recheck Patient is coming in for wound care recheck.  She continues to do daily dressing changes with Santyl and covering with a bandage.  She feels like things are going really well.  She denies any fevers or chills or drainage or purulence.  Relevant past medical, surgical, family and social history reviewed and updated as indicated. Interim medical history since our last visit reviewed. Allergies and medications reviewed and updated.  Review of Systems  Skin:  Positive for wound. Negative for color change and rash.   Per HPI unless specifically indicated above   Allergies as of 08/18/2021       Reactions   Codeine    Ringing in ears   Morphine Hives   Other Hives   Sporanox - for nail fungus        Medication List        Accurate as of August 18, 2021  9:23 AM. If you have any questions, ask your nurse or doctor.          AIRBORNE PO Take 1 tablet by mouth daily.   amLODipine 5 MG tablet Commonly known as: NORVASC Take 1 tablet (5 mg total) by mouth daily.   aspirin EC 81 MG tablet Take 81 mg by mouth daily. Swallow whole.   baclofen 10 MG tablet Commonly known as: LIORESAL Take 1 tablet (10 mg total) by mouth 3 (three) times daily. What changed:  when to take this reasons to take this   buPROPion 150 MG 24 hr tablet Commonly known as: Wellbutrin XL Take 1 tablet (150 mg total) by mouth daily.   Chantix Starting Month Pak 0.5 MG X 11 & 1 MG X 42 Tbpk Generic drug: Varenicline Tartrate (Starter) Take 0.5 mg by mouth daily for 3 days, THEN 0.5 mg 2 (two) times daily for 4 days, THEN 1 mg 2 (two) times daily for 23  days. Start taking on: Jul 28, 2021   varenicline 1 MG tablet Commonly known as: Chantix Continuing Month Pak Take 1 tablet (1 mg total) by mouth 2 (two) times daily.   Fish Oil 1000 MG Caps Take 1,000 mg by mouth daily.   ibuprofen 200 MG tablet Commonly known as: ADVIL Take 800 mg by mouth every 6 (six) hours as needed for moderate pain.   lisinopril 20 MG tablet Commonly known as: ZESTRIL Take 2 tablets (40 mg total) by mouth daily.   loperamide 2 MG capsule Commonly known as: IMODIUM Take 2 mg by mouth as needed for diarrhea or loose stools.   oxyCODONE-acetaminophen 5-325 MG tablet Commonly known as: PERCOCET/ROXICET Take 1-2 tablets by mouth every 4 (four) hours as needed for moderate pain.   Ozempic (0.25 or 0.5 MG/DOSE) 2 MG/1.5ML Sopn Generic drug: Semaglutide(0.25 or 0.'5MG'$ /DOS) Inject 0.5 mg into the skin once a week. What changed: when to take this   rosuvastatin 5 MG tablet Commonly known as: Crestor Take 1 tablet (5 mg total) by mouth daily.   silver sulfADIAZINE 1 % cream Commonly known  as: SSD Apply 1 application. topically daily.   tobramycin 0.3 % ophthalmic solution Commonly known as: TOBREX Place 1 drop into both eyes See admin instructions. Used before eye injections every 3 months         Objective:   BP (!) 132/54   Pulse (!) 56   Temp 98 F (36.7 C)   Ht '5\' 4"'$  (1.626 m)   Wt 187 lb (84.8 kg)   SpO2 100%   BMI 32.10 kg/m   Wt Readings from Last 3 Encounters:  08/18/21 187 lb (84.8 kg)  08/03/21 186 lb 11.2 oz (84.7 kg)  07/28/21 189 lb (85.7 kg)    Physical Exam Skin:    General: Skin is warm and dry.     Findings: Wound present.         Continue to dress daily, using Santyl and covering bandage.  Looking a lot better.  No changes.  Assessment & Plan:   Problem List Items Addressed This Visit   None Visit Diagnoses     Wound of right lower extremity, subsequent encounter    -  Primary        Follow up  plan: Return if symptoms worsen or fail to improve, for 3 to 4-week wound care recheck.  Counseling provided for all of the vaccine components No orders of the defined types were placed in this encounter.   Caryl Pina, MD [facility] 08/18/2021, 9:23 AM

## 2021-08-22 ENCOUNTER — Encounter: Payer: Self-pay | Admitting: Family

## 2021-08-22 ENCOUNTER — Ambulatory Visit (INDEPENDENT_AMBULATORY_CARE_PROVIDER_SITE_OTHER): Payer: Medicare HMO | Admitting: Family

## 2021-08-22 ENCOUNTER — Telehealth: Payer: Self-pay | Admitting: Family Medicine

## 2021-08-22 VITALS — BP 131/57 | HR 56 | Temp 97.3°F | Ht 64.0 in | Wt 191.0 lb

## 2021-08-22 DIAGNOSIS — S81801D Unspecified open wound, right lower leg, subsequent encounter: Secondary | ICD-10-CM

## 2021-08-22 DIAGNOSIS — I739 Peripheral vascular disease, unspecified: Secondary | ICD-10-CM | POA: Diagnosis not present

## 2021-08-22 DIAGNOSIS — I771 Stricture of artery: Secondary | ICD-10-CM

## 2021-08-22 MED ORDER — MUPIROCIN 2 % EX OINT: 1.0000 "application " | TOPICAL_OINTMENT | Freq: Two times a day (BID) | CUTANEOUS | 0 refills | Status: DC

## 2021-08-22 NOTE — Telephone Encounter (Signed)
Pt called stating that she had a visit with PCP this past Friday for LLE wound. Says she has been putting triple A ointment on it and says its red and puffy and puss is coming out of it.   Needs advise on what to do. Can PCPs covering provider Karsten Fells) advise on this?

## 2021-08-22 NOTE — Patient Instructions (Signed)

## 2021-08-22 NOTE — Progress Notes (Signed)
Subjective:    Patient ID: Kimberly Donaldson, female    DOB: 16-Sep-1955, 66 y.o.   MRN: [id number]  Chief Complaint  Patient presents with   Wound Check   Pt presents to the office today to recheck wound of left upper thigh. She had a left femoral-popliteal bypass on 07/18/21. States the area was improving, but noticed an enlargement in a section of the wound on 08/18/21.  She was doing daily dressing changes with Santyl and covering with a bandage. She reports the area has improved.   She reports Saturday morning the area was erythemas and had a purulent discharge. States the discharge has greatly lessen everyday since Saturday. Denies any fever or increased pain.  Wound Check She was originally treated 5 to 10 days ago. Previous treatment included wound cleansing or irrigation. Her temperature was unmeasured prior to arrival. There has been colored discharge from the wound. The redness has improved. The swelling has improved. The pain has improved.     Review of Systems  All other systems reviewed and are negative.     Objective:   Physical Exam Vitals reviewed.  Constitutional:      General: She is not in acute distress.    Appearance: She is well-developed.  HENT:     Head: Normocephalic and atraumatic.     Right Ear: External ear normal.  Eyes:     Pupils: Pupils are equal, round, and reactive to light.  Neck:     Thyroid: No thyromegaly.  Cardiovascular:     Rate and Rhythm: Normal rate and regular rhythm.     Heart sounds: Normal heart sounds. No murmur heard. Pulmonary:     Effort: Pulmonary effort is normal. No respiratory distress.     Breath sounds: Normal breath sounds. No wheezing.  Abdominal:     General: Bowel sounds are normal. There is no distension.     Palpations: Abdomen is soft.     Tenderness: There is no abdominal tenderness.  Musculoskeletal:        General: No tenderness. Normal range of motion.     Cervical back: Normal range of motion and neck  supple.  Skin:    General: Skin is warm and dry.          Comments: Small wound with serous discharge, mildly erythemas   Neurological:     Mental Status: She is alert and oriented to person, place, and time.     Cranial Nerves: No cranial nerve deficit.     Deep Tendon Reflexes: Reflexes are normal and symmetric.  Psychiatric:        Behavior: Behavior normal.        Thought Content: Thought content normal.        Judgment: Judgment normal.       BP (!) 131/57   Pulse (!) 56   Temp (!) 97.3 F (36.3 C)   Ht '5\' 4"'$  (1.626 m)   Wt 191 lb (86.6 kg)   SpO2 99%   BMI 32.79 kg/m      Assessment & Plan:  Kimberly Donaldson comes in today with chief complaint of Wound Check   Diagnosis and orders addressed:  1. Wound of right lower extremity, subsequent encounter  - mupirocin ointment (BACTROBAN) 2 %; Apply 1 application. topically 2 (two) times daily.  Dispense: 22 g; Refill: 0  2. PAD (peripheral artery disease) (HCC) - mupirocin ointment (BACTROBAN) 2 %; Apply 1 application. topically 2 (two) times daily.  Dispense:  22 g; Refill: 0  3. Arterial insufficiency (HCC) - mupirocin ointment (BACTROBAN) 2 %; Apply 1 application. topically 2 (two) times daily.  Dispense: 22 g; Refill: 0  Continue to do dressing changes Keep clean and dry Report any increased redness, discharge, or fevers If worsening or no improvement call office and I will send in oral antibiotics.   Kimberly Dun, FNP

## 2021-08-22 NOTE — Telephone Encounter (Signed)
Appt made for today

## 2021-09-15 ENCOUNTER — Ambulatory Visit: Payer: Medicare HMO | Admitting: Family Medicine

## 2021-10-10 ENCOUNTER — Other Ambulatory Visit: Payer: Self-pay | Admitting: Family Medicine

## 2021-10-10 DIAGNOSIS — Z716 Tobacco abuse counseling: Secondary | ICD-10-CM

## 2021-10-25 ENCOUNTER — Telehealth: Payer: Self-pay | Admitting: Family Medicine

## 2021-10-25 NOTE — Telephone Encounter (Signed)
  Left message for patient to call back and schedule Medicare Annual Wellness Visit (AWV) to be completed by video or phone.  No hx of AWV eligible for AWVI per [facility] as of  06/17/2021  Please schedule at anytime with Jeff Davis   45  Minutes appointment   Any questions, please call me at [phone]

## 2021-10-30 ENCOUNTER — Ambulatory Visit (INDEPENDENT_AMBULATORY_CARE_PROVIDER_SITE_OTHER): Payer: Medicare HMO | Admitting: Family

## 2021-10-30 ENCOUNTER — Encounter: Payer: Self-pay | Admitting: Family

## 2021-10-30 VITALS — BP 140/58 | HR 58 | Temp 97.9°F | Ht 64.0 in | Wt 199.2 lb

## 2021-10-30 DIAGNOSIS — E1169 Type 2 diabetes mellitus with other specified complication: Secondary | ICD-10-CM

## 2021-10-30 DIAGNOSIS — T63304A Toxic effect of unspecified spider venom, undetermined, initial encounter: Secondary | ICD-10-CM

## 2021-10-30 MED ORDER — CEFTRIAXONE SODIUM 1 G IJ SOLR
1.0000 g | Freq: Once | INTRAMUSCULAR | Status: AC
  Administered 2021-10-30: 1 g via INTRAMUSCULAR

## 2021-10-30 MED ORDER — DOXYCYCLINE HYCLATE 100 MG PO TABS: 100.0000 mg | ORAL_TABLET | Freq: Two times a day (BID) | ORAL | 0 refills | Status: DC

## 2021-10-30 NOTE — Progress Notes (Signed)
Subjective:    Patient ID: Kimberly Donaldson, female    DOB: 09-19-1955, 66 y.o.   MRN: [id number]  Chief Complaint  Patient presents with   Wound Check    Upper left thigh    PT presents to the office today with a blister she noticed on her left lower abdomen she noticed a week ago. States she noticed a blister and it "popped" during her sleep and now has noticed it erythemas. She is a diabetic and her last A1C was 5.7. Wound Check She was originally treated 5 to 10 days ago. Her temperature was unmeasured prior to arrival.      Review of Systems  All other systems reviewed and are negative.      Objective:   Physical Exam Vitals reviewed.  Constitutional:      General: She is not in acute distress.    Appearance: She is well-developed. She is obese.  HENT:     Head: Normocephalic and atraumatic.  Eyes:     Pupils: Pupils are equal, round, and reactive to light.  Neck:     Thyroid: No thyromegaly.  Cardiovascular:     Rate and Rhythm: Normal rate and regular rhythm.     Heart sounds: Normal heart sounds. No murmur heard. Pulmonary:     Effort: Pulmonary effort is normal. No respiratory distress.     Breath sounds: Normal breath sounds. No wheezing.  Abdominal:     General: Bowel sounds are normal. There is no distension.     Palpations: Abdomen is soft.     Tenderness: There is no abdominal tenderness.  Musculoskeletal:        General: No tenderness. Normal range of motion.     Cervical back: Normal range of motion and neck supple.  Skin:    General: Skin is warm and dry.     Findings: Erythema present.     Comments: Circular lesion that is 2X2 cm, erythemas border, No discharge.  Neurological:     Mental Status: She is alert and oriented to person, place, and time.     Cranial Nerves: No cranial nerve deficit.     Deep Tendon Reflexes: Reflexes are normal and symmetric.  Psychiatric:        Behavior: Behavior normal.        Thought Content: Thought content  normal.        Judgment: Judgment normal.     BP (!) 140/58   Pulse (!) 58   Temp 97.9 F (36.6 C) (Temporal)   Ht '5\' 4"'$  (1.626 m)   Wt 199 lb 3.2 oz (90.4 kg)   SpO2 98%   BMI 34.19 kg/m         Assessment & Plan:   Kimberly Donaldson comes in today with chief complaint of Wound Check (Upper left thigh )   Diagnosis and orders addressed:  1. Spider bite wound, undetermined intent, initial encounter - doxycycline (VIBRA-TABS) 100 MG tablet; Take 1 tablet (100 mg total) by mouth 2 (two) times daily.  Dispense: 20 tablet; Refill: 0 - cefTRIAXone (ROCEPHIN) injection 1 g  2. Type 2 diabetes mellitus with other specified complication, without long-term current use of insulin (HCC)  Given wound and symptoms, I believe some sort of insect/spider bite.  Start doxycyline and given Rocephin  Needs good control of blood sugars. Follow up with PCP or me on Friday.  Call if symptoms worsen or do not improve     Evelina Dun, FNP

## 2021-10-30 NOTE — Patient Instructions (Signed)
Spider Bite Spider bites are not common. When spider bites do happen, most do not cause serious health problems. There are only a few types of spider bites that can cause serious health problems. What are the causes? A spider bite is often caused by a person accidentally making contact with a spider in a way that traps the spider against the skin. What increases the risk? You are more likely to be bitten by a spider if: You live in an area where spiders live, and you disturb their habitat. You work outdoors, such as a Engineer, production. You participate in certain outdoor activities, such as playing in leaves or hiking. What are the signs or symptoms? Symptoms may vary depending on the type of spider. Some spider bites may cause symptoms within 1 hour after the bite. For other spider bites, it may take 1-2 days for symptoms to develop. Common symptoms of this condition include: A raised area that is red. Redness and swelling around the area of the bite or bites. Discomfort or pain in the area of the bite. A few types of spiders, such as the black widow or the brown recluse, can inject poison (venom) into a bite wound. This venom causes more serious symptoms. Symptoms of a venomous spider bite vary and may include: Muscle cramps. Nausea, vomiting, or abdominal pain. A fever. A skin sore (lesion) that spreads. This can break into an open wound (skin ulcer). Light-headedness or dizziness. How is this diagnosed? This condition may be diagnosed based on your symptoms and a physical exam. Your health care provider will ask about the history of your injury and any details you may have about the spider. This may help to determine what type of spider bit you. How is this treated? Many spider bites do not require treatment. If needed, this condition may be treated by: Icing and keeping the bite area raised (elevated). Taking or applying over-the-counter or prescription medicines to help control  symptoms such as pain and itching. Having a tetanus shot, if needed. Taking antibiotic medicine. Follow these instructions at home: Medicines Take or apply over-the-counter and prescription medicines only as told by your health care provider. If you were prescribed an antibiotic medicine, take or apply it as told by your health care provider. Do not stop using the antibiotic even if you start to feel better or if your condition improves. Managing pain and swelling  If directed, put ice on the bite area. To do this: Put ice in a plastic bag. Place a towel between your skin and the bag. Leave the ice on for 20 minutes, 2-3 times a day. Remove the ice if your skin turns bright red. This is very important. If you cannot feel pain, heat, or cold, you have a greater risk of damage to the area. Elevate the bite area above the level of your heart while you are sitting or lying down. General instructions  Do not scratch the bite area. Keep the bite area clean and dry. Wash the bite area daily with soap and water as told by your health care provider. Keep all follow-up visits. This is important. Contact a health care provider if: Your bite does not get better after 3 days of treatment. Your bite turns black or purple. You have increased redness, swelling, or pain at the site of the bite. Get help right away if: You develop shortness of breath or chest pain. You have fluid, blood, or pus coming from the bite area. You have  muscle cramps or painful muscle spasms. You develop abdominal pain, nausea, or vomiting. You feel unusually tired (fatigued) or sleepy. These symptoms may represent a serious problem that is an emergency. Do not wait to see if the symptoms will go away. Get medical help right away. Call your local emergency services (911 in the [place] Do not drive yourself to the hospital. Summary Spider bites are not common. When spider bites do happen, most do not cause serious health  problems. Take or apply over-the-counter and prescription medicines only as told by your health care provider. Keep the bite area clean and dry. Wash the bite area daily with soap and water as told by your health care provider. Contact a health care provider if you have increased redness, swelling, or pain at the site of the bite. Get help right away if you develop shortness of breath or chest pain. This information is not intended to replace advice given to you by your health care provider. Make sure you discuss any questions you have with your health care provider. Document Revised: 12/23/2019 Document Reviewed: 12/23/2019 Elsevier Patient Education  [place].

## 2021-10-31 ENCOUNTER — Other Ambulatory Visit: Payer: Self-pay | Admitting: Family Medicine

## 2021-10-31 ENCOUNTER — Ambulatory Visit (INDEPENDENT_AMBULATORY_CARE_PROVIDER_SITE_OTHER): Payer: Medicare HMO

## 2021-10-31 VITALS — Wt 199.0 lb

## 2021-10-31 DIAGNOSIS — Z0000 Encounter for general adult medical examination without abnormal findings: Secondary | ICD-10-CM | POA: Diagnosis not present

## 2021-10-31 DIAGNOSIS — Z1231 Encounter for screening mammogram for malignant neoplasm of breast: Secondary | ICD-10-CM

## 2021-10-31 NOTE — Progress Notes (Signed)
Subjective:   Kimberly Donaldson is a 66 y.o. female who presents for an Initial Medicare Annual Wellness Visit.  Virtual Visit via Telephone Note  I connected with  Kimberly Donaldson on 10/31/21 at 12:00 PM EDT by telephone and verified that I am speaking with the correct person using two identifiers.  Location: Patient: Home Provider: [facility] Persons participating in the virtual visit: patient/Nurse Health Advisor   I discussed the limitations, risks, security and privacy concerns of performing an evaluation and management service by telephone and the availability of in person appointments. The patient expressed understanding and agreed to proceed.  Interactive audio and video telecommunications were attempted between this nurse and patient, however failed, due to patient having technical difficulties OR patient did not have access to video capability.  We continued and completed visit with audio only.  Some vital signs may be absent or patient reported.   Allyson Tineo E Livvy Spilman, LPN   Review of Systems     Cardiac Risk Factors include: advanced age (>51mn, >>29women);diabetes mellitus;hypertension;obesity (BMI >30kg/m2);sedentary lifestyle;smoking/ tobacco exposure;Other (see comment), Risk factor comments: PAD     Objective:    Today's Vitals   10/31/21 1207  Weight: 199 lb (90.3 kg)   Body mass index is 34.16 kg/m.     10/31/2021   12:16 PM 07/18/2021    6:04 AM 07/12/2021    8:03 AM 07/07/2021    6:15 AM 05/23/2021    5:43 PM 05/19/2014    8:44 AM  Advanced Directives  Does Patient Have a Medical Advance Directive? No No No No No No  Would patient like information on creating a medical advance directive? No - Patient declined No - Patient declined No - Patient declined No - Patient declined  No - patient declined information    Current Medications (verified) Outpatient Encounter Medications as of 10/31/2021  Medication Sig   amLODipine (NORVASC) 5 MG tablet Take 1 tablet (5 mg  total) by mouth daily.   aspirin EC 81 MG tablet Take 81 mg by mouth daily. Swallow whole.   baclofen (LIORESAL) 10 MG tablet Take 1 tablet (10 mg total) by mouth 3 (three) times daily. (Patient taking differently: Take 10 mg by mouth 3 (three) times daily as needed for muscle spasms.)   buPROPion (WELLBUTRIN XL) 150 MG 24 hr tablet TAKE ONE TABLET ONCE DAILY   doxycycline (VIBRA-TABS) 100 MG tablet Take 1 tablet (100 mg total) by mouth 2 (two) times daily.   ibuprofen (ADVIL,MOTRIN) 200 MG tablet Take 800 mg by mouth every 6 (six) hours as needed for moderate pain.   lisinopril (ZESTRIL) 20 MG tablet Take 2 tablets (40 mg total) by mouth daily.   loperamide (IMODIUM) 2 MG capsule Take 2 mg by mouth as needed for diarrhea or loose stools.   Multiple Vitamins-Minerals (AIRBORNE PO) Take 1 tablet by mouth daily.   mupirocin ointment (BACTROBAN) 2 % Apply 1 application. topically 2 (two) times daily.   Omega-3 Fatty Acids (FISH OIL) 1000 MG CAPS Take 1,000 mg by mouth daily.   rosuvastatin (CRESTOR) 5 MG tablet Take 1 tablet (5 mg total) by mouth daily.   Semaglutide,0.25 or 0.'5MG'$ /DOS, (OZEMPIC, 0.25 OR 0.5 MG/DOSE,) 2 MG/1.5ML SOPN Inject 0.5 mg into the skin once a week. (Patient taking differently: Inject 0.5 mg into the skin every Monday.)   tobramycin (TOBREX) 0.3 % ophthalmic solution Place 1 drop into both eyes See admin instructions. Used before eye injections every 3 months   oxyCODONE-acetaminophen (  PERCOCET/ROXICET) 5-325 MG tablet Take 1-2 tablets by mouth every 4 (four) hours as needed for moderate pain. (Patient not taking: Reported on 10/31/2021)   [DISCONTINUED] silver sulfADIAZINE (SSD) 1 % cream Apply 1 application. topically daily. (Patient not taking: Reported on 10/31/2021)   [DISCONTINUED] varenicline (CHANTIX CONTINUING MONTH PAK) 1 MG tablet Take 1 tablet (1 mg total) by mouth 2 (two) times daily. (Patient not taking: Reported on 10/31/2021)   No facility-administered encounter  medications on file as of 10/31/2021.    Allergies (verified) Codeine, Morphine, and Other   History: Past Medical History:  Diagnosis Date   Arthritis    Diabetes mellitus without complication ([place])    Hypertension    Retina disorder, bilateral    Past Surgical History:  Procedure Laterality Date   ABDOMINAL AORTOGRAM W/LOWER EXTREMITY N/A 07/07/2021   Procedure: ABDOMINAL AORTOGRAM W/LOWER EXTREMITY;  Surgeon: Angelia Mould, MD;  Location: [facility];  Service: Cardiovascular;  Laterality: N/A;   CESAREAN SECTION     CHOLECYSTECTOMY     COLONOSCOPY  11/11/2007   Dr.Jacobs   EYE SURGERY     bilateral cataracts   FEMORAL-POPLITEAL BYPASS GRAFT Left 07/18/2021   Procedure: LEFT FEMORAL-POPLITEAL ARTERY BYPASS WITH VEIN;  Surgeon: Angelia Mould, MD;  Location: [facility];  Service: Vascular;  Laterality: Left;   FOOT SURGERY     FRACTURE SURGERY     INTRAOPERATIVE ARTERIOGRAM Left 07/18/2021   Procedure: INTRA OPERATIVE ARTERIOGRAM;  Surgeon: Angelia Mould, MD;  Location: [facility];  Service: Vascular;  Laterality: Left;   TUBAL LIGATION     WRIST SURGERY     Family History  Problem Relation Age of Onset   Cancer Mother    Liver cancer Mother    Colon polyps Father    Cancer Father    Diabetes Father    Bladder Cancer Father    Colon cancer Neg Hx    Esophageal cancer Neg Hx    Pancreatic cancer Neg Hx    Stomach cancer Neg Hx    Breast cancer Neg Hx    Social History   Socioeconomic History   Marital status: Widowed    Spouse name: Not on file   Number of children: 4   Years of education: Not on file   Highest education level: Not on file  Occupational History   Occupation: retired  Tobacco Use   Smoking status: Every Day    Packs/day: 1.00    Types: Cigarettes    Passive exposure: Never   Smokeless tobacco: Never  Vaping Use   Vaping Use: Never used  Substance and Sexual Activity   Alcohol use: Yes    Comment: occ   Drug  use: Yes    Types: Marijuana    Comment: gummies every 2-3 months   Sexual activity: Not Currently    Birth control/protection: Post-menopausal  Other Topics Concern   Not on file  Social History Narrative   Daughter and 2 grandchildren live with her   5 grandchildren   Social Determinants of Health   Financial Resource Strain: Low Risk  (10/31/2021)   Overall Financial Resource Strain (CARDIA)    Difficulty of Paying Living Expenses: Not hard at all  Food Insecurity: No Food Insecurity (10/31/2021)   Hunger Vital Sign    Worried About Running Out of Food in the Last Year: Never true    [place] in the Last Year: Never true  Transportation Needs: No Transportation Needs (10/31/2021)  PRAPARE - Hydrologist (Medical): No    Lack of Transportation (Non-Medical): No  Physical Activity: Insufficiently Active (10/31/2021)   Exercise Vital Sign    Days of Exercise per Week: 7 days    Minutes of Exercise per Session: 10 min  Stress: No Stress Concern Present (10/31/2021)   [place]    Feeling of Stress : Only a little  Social Connections: Moderately Integrated (10/31/2021)   Social Connection and Isolation Panel [NHANES]    Frequency of Communication with Friends and Family: More than three times a week    Frequency of Social Gatherings with Friends and Family: More than three times a week    Attends Religious Services: More than 4 times per year    Active Member of [employer] or Organizations: Yes    Attends Archivist Meetings: More than 4 times per year    Marital Status: Widowed    Tobacco Counseling Ready to quit: Not Answered Counseling given: Not Answered   Clinical Intake:  Pre-visit preparation completed: Yes  Pain : No/denies pain     BMI - recorded: 34.16 Nutritional Status: BMI > 30  Obese Nutritional Risks: None Diabetes: Yes CBG done?: No Did pt.  bring in CBG monitor from home?: No  How often do you need to have someone help you when you read instructions, pamphlets, or other written materials from your doctor or pharmacy?: 1 - Never  Diabetic? Nutrition Risk Assessment:  Has the patient had any N/V/D within the last 2 months?  No  Does the patient have any non-healing wounds?  Yes  - taking antibiotics - thinks it may be spider bite Has the patient had any unintentional weight loss or weight gain?  No   Diabetes:  Is the patient diabetic?  Yes  If diabetic, was a CBG obtained today?  No  Did the patient bring in their glucometer from home?  No  How often do you monitor your CBG's? never.   Financial Strains and Diabetes Management:  Are you having any financial strains with the device, your supplies or your medication? No .  Does the patient want to be seen by Chronic Care Management for management of their diabetes?  No  Would the patient like to be referred to a Nutritionist or for Diabetic Management?  No   Diabetic Exams:  Diabetic Eye Exam: Completed 11/2020.   Diabetic Foot Exam: Completed 06/24/2020. Pt has been advised about the importance in completing this exam. Pt is scheduled for diabetic foot exam on 11/03/2021.    Interpreter Needed?: No  Information entered by :: Odai Wimmer, LPN   Activities of Daily Living    10/31/2021   12:16 PM 07/19/2021   10:00 PM  In your present state of health, do you have any difficulty performing the following activities:  Hearing? 0 0  Vision? 0 0  Difficulty concentrating or making decisions? 0 0  Walking or climbing stairs? 0 0  Dressing or bathing? 0 0  Doing errands, shopping? 0 0  Preparing Food and eating ? N   Using the Toilet? N   In the past six months, have you accidently leaked urine? Y   Do you have problems with loss of bowel control? N   Managing your Medications? N   Managing your Finances? N   Housekeeping or managing your Housekeeping? N     Patient  Care Team: Dettinger, Fransisca Kaufmann,  MD as PCP - General (Family Medicine)  Indicate any recent Medical Services you may have received from other than [facility] providers in the past year (date may be approximate).     Assessment:   This is a routine wellness examination for Kimberly Donaldson.  Hearing/Vision screen Hearing Screening - Comments:: Denies hearing difficulties   Vision Screening - Comments:: Wears rx glasses - up to date with routine eye exams with [facility] and goes to [facility] issues and exercise activities discussed: Current Exercise Habits: Home exercise routine, Type of exercise: walking;stretching, Time (Minutes): 10, Frequency (Times/Week): 7, Weekly Exercise (Minutes/Week): 70, Intensity: Mild, Exercise limited by: orthopedic condition(s)   Goals Addressed             This Visit's Progress    Patient Stated       Stay active and healthy - try to quit smoking       Depression Screen    10/31/2021   12:16 PM 10/30/2021   10:52 AM 08/18/2021    9:09 AM 07/28/2021    2:23 PM 07/07/2021    1:08 PM 06/20/2021   11:04 AM 05/05/2021   11:37 AM  PHQ 2/9 Scores  PHQ - 2 Score 0 0 0 0 0 0 0  PHQ- 9 Score   0 0 0      Fall Risk    10/31/2021   12:10 PM 10/30/2021   10:52 AM 08/22/2021    2:30 PM 08/18/2021    9:09 AM 07/28/2021    2:23 PM  Fall Risk   Falls in the past year? 0 0 '1 1 1  '$ Number falls in past yr: 0  0 0 0  Injury with Fall? 0  0 0 0  Risk for fall due to : Orthopedic patient;History of fall(s)   History of fall(s) History of fall(s)  Risk for fall due to: Comment tripped over something      Follow up Falls prevention discussed  Falls evaluation completed Falls evaluation completed Education provided    [place]:  Any stairs in or around the home? Yes  If so, are there any without handrails? No  Home free of loose throw rugs in walkways, pet beds, electrical cords, etc? Yes  Adequate lighting in your home  to reduce risk of falls? Yes   ASSISTIVE DEVICES UTILIZED TO PREVENT FALLS:  Life alert? No  Use of a cane, walker or w/c? No  Grab bars in the bathroom? Yes  Shower chair or bench in shower? Yes  Elevated toilet seat or a handicapped toilet? Yes   TIMED UP AND GO:  Was the test performed? No . Telephonic visit  Cognitive Function:        10/31/2021   12:17 PM  6CIT Screen  What Year? 0 points  What month? 0 points  What time? 0 points  Count back from 20 2 points  Months in reverse 0 points  Repeat phrase 0 points  Total Score 2 points    Immunizations Immunization History  Administered Date(s) Administered   Tdap 06/24/2020   Zoster Recombinat (Shingrix) 05/05/2021, 07/28/2021    TDAP status: Up to date  Flu Vaccine status: Declined, Education has been provided regarding the importance of this vaccine but patient still declined. Advised may receive this vaccine at local pharmacy or Health Dept. Aware to provide a copy of the vaccination record if obtained from local pharmacy or Health Dept. Verbalized acceptance and understanding.  Pneumococcal vaccine status: Due, Education has been provided regarding the importance of this vaccine. Advised may receive this vaccine at local pharmacy or Health Dept. Aware to provide a copy of the vaccination record if obtained from local pharmacy or Health Dept. Verbalized acceptance and understanding.  Covid-19 vaccine status: Declined, Education has been provided regarding the importance of this vaccine but patient still declined. Advised may receive this vaccine at local pharmacy or Health Dept.or vaccine clinic. Aware to provide a copy of the vaccination record if obtained from local pharmacy or Health Dept. Verbalized acceptance and understanding.  Qualifies for Shingles Vaccine? Yes   Zostavax completed No   Shingrix Completed?: Yes  Screening Tests Health Maintenance  Topic Date Due   COVID-19 Vaccine (1) Never done   FOOT  EXAM  06/24/2021   INFLUENZA VACCINE  10/17/2021   DEXA SCAN  12/26/2021 (Originally 06/25/2020)   Pneumonia Vaccine 75+ Years old (1 - PCV) 05/05/2022 (Originally 06/25/1961)   OPHTHALMOLOGY EXAM  12/05/2021   HEMOGLOBIN A1C  01/11/2022   MAMMOGRAM  11/17/2022   COLONOSCOPY (Pts 45-35yr Insurance coverage will need to be confirmed)  11/02/2027   TETANUS/TDAP  06/25/2030   Hepatitis C Screening  Completed   Zoster Vaccines- Shingrix  Completed   HPV VACCINES  Aged Out    Health Maintenance  Health Maintenance Due  Topic Date Due   COVID-19 Vaccine (1) Never done   FOOT EXAM  06/24/2021   INFLUENZA VACCINE  10/17/2021    Colorectal cancer screening: Type of screening: Colonoscopy. Completed 11/01/2020. Repeat every 7 years  Mammogram status: Ordered 10/31/2021. Pt provided with contact info and advised to call to schedule appt.   Bone Density status: Ordered 10/31/2021. Pt provided with contact info and advised to call to schedule appt.  Lung Cancer Screening: (Low Dose CT Chest recommended if Age 66-80years, 30 pack-year currently smoking OR have quit w/in 15years.) does qualify.   Lung Cancer Screening Referral: discuss with Dr Dettinger  Additional Screening:  Hepatitis C Screening: does qualify; Completed 06/24/2020  Vision Screening: Recommended annual ophthalmology exams for early detection of glaucoma and other disorders of the eye. Is the patient up to date with their annual eye exam?  Yes  Who is the provider or what is the name of the office in which the patient attends annual eye exams[facility] is not established with a provider, would they like to be referred to a provider to establish care? No .   Dental Screening: Recommended annual dental exams for proper oral hygiene  Community Resource Referral / Chronic Care Management: CRR required this visit?  No   CCM required this visit?  No      Plan:     I have personally reviewed and noted the  following in the patient's chart:   Medical and social history Use of alcohol, tobacco or illicit drugs  Current medications and supplements including opioid prescriptions. Patient is not currently taking opioid prescriptions. Functional ability and status Nutritional status Physical activity Advanced directives List of other physicians Hospitalizations, surgeries, and ER visits in previous 12 months Vitals Screenings to include cognitive, depression, and falls Referrals and appointments  In addition, I have reviewed and discussed with patient certain preventive protocols, quality metrics, and best practice recommendations. A written personalized care plan for preventive services as well as general preventive health recommendations were provided to patient.     ASandrea Hammond LPN   [id number]  Nurse Notes: Due  for low dose chest CT - recommended to discuss with PCP at visit Friday

## 2021-10-31 NOTE — Patient Instructions (Addendum)
Kimberly Donaldson , Thank you for taking time to come for your Medicare Wellness Visit. I appreciate your ongoing commitment to your health goals. Please review the following plan we discussed and let me know if I can assist you in the future.   Screening recommendations/referrals: Colonoscopy: Done 11/01/2020 - repeat in 7 years Mammogram: Done 11/16/2020 - Repeat annually  Bone Density: Due - recommended every 2 years for women over age 66 - We can do after your visit 11/03/21 Low Dose Chest CT/Lung Cancer Screening: recommended annually for smokers of over 20 years* discuss with Dr Dettinger Recommended yearly ophthalmology/optometry visit for glaucoma screening and checkup Recommended yearly dental visit for hygiene and checkup  Vaccinations: Influenza vaccine: declined - recommended annually each fall Pneumococcal vaccine: Recommend [id number] - once per lifetime! Tdap vaccine: Done 06/24/2020 -Repeat in 10 years  Shingles vaccine: Done 05/05/2021 & 07/28/2021 Covid-19: Declined  Advanced directives: Advance directive discussed with you today. Even though you declined this today, please call our office should you change your mind, and we can give you the proper paperwork for you to fill out.   Conditions/risks identified: Aim for 30 minutes of exercise or brisk walking, 6-8 glasses of water, and 5 servings of fruits and vegetables each day.  If you wish to quit smoking, help is available. For free tobacco cessation program offerings call the [facility] at 250 337 3703 or Live Well Line at 272-007-9035. You may also visit [email or web address].com or email [email or web address] for more information on other programs.   You may also call 1-800-QUIT-NOW (681) 867-1972) or visit www.VirusCrisis.dk or www.BecomeAnEx.org for additional resources on smoking cessation.    Next appointment: Follow up in one year for your annual wellness visit    Preventive Care 65 Years and Older,  Female Preventive care refers to lifestyle choices and visits with your health care provider that can promote health and wellness. What does preventive care include? A yearly physical exam. This is also called an annual well check. Dental exams once or twice a year. Routine eye exams. Ask your health care provider how often you should have your eyes checked. Personal lifestyle choices, including: Daily care of your teeth and gums. Regular physical activity. Eating a healthy diet. Avoiding tobacco and drug use. Limiting alcohol use. Practicing safe sex. Taking low-dose aspirin every day. Taking vitamin and mineral supplements as recommended by your health care provider. What happens during an annual well check? The services and screenings done by your health care provider during your annual well check will depend on your age, overall health, lifestyle risk factors, and family history of disease. Counseling  Your health care provider may ask you questions about your: Alcohol use. Tobacco use. Drug use. Emotional well-being. Home and relationship well-being. Sexual activity. Eating habits. History of falls. Memory and ability to understand (cognition). Work and work Statistician. Reproductive health. Screening  You may have the following tests or measurements: Height, weight, and BMI. Blood pressure. Lipid and cholesterol levels. These may be checked every 5 years, or more frequently if you are over 22 years old. Skin check. Lung cancer screening. You may have this screening every year starting at age 27 if you have a 30-pack-year history of smoking and currently smoke or have quit within the past 15 years. Fecal occult blood test (FOBT) of the stool. You may have this test every year starting at age 27. Flexible sigmoidoscopy or colonoscopy. You may have a sigmoidoscopy every 5 years or a colonoscopy every 10  years starting at age 66. Hepatitis C blood test. Hepatitis B blood  test. Sexually transmitted disease (STD) testing. Diabetes screening. This is done by checking your blood sugar (glucose) after you have not eaten for a while (fasting). You may have this done every 1-3 years. Bone density scan. This is done to screen for osteoporosis. You may have this done starting at age 31. Mammogram. This may be done every 1-2 years. Talk to your health care provider about how often you should have regular mammograms. Talk with your health care provider about your test results, treatment options, and if necessary, the need for more tests. Vaccines  Your health care provider may recommend certain vaccines, such as: Influenza vaccine. This is recommended every year. Tetanus, diphtheria, and acellular pertussis (Tdap, Td) vaccine. You may need a Td booster every 10 years. Zoster vaccine. You may need this after age 74. Pneumococcal 13-valent conjugate (PCV13) vaccine. One dose is recommended after age 35. Pneumococcal polysaccharide (PPSV23) vaccine. One dose is recommended after age 24. Talk to your health care provider about which screenings and vaccines you need and how often you need them. This information is not intended to replace advice given to you by your health care provider. Make sure you discuss any questions you have with your health care provider. Document Released: 04/01/2015 Document Revised: 11/23/2015 Document Reviewed: 01/04/2015 Elsevier Interactive Patient Education  2017 [place] Prevention in the Home Falls can cause injuries. They can happen to people of all ages. There are many things you can do to make your home safe and to help prevent falls. What can I do on the outside of my home? Regularly fix the edges of walkways and driveways and fix any cracks. Remove anything that might make you trip as you walk through a door, such as a raised step or threshold. Trim any bushes or trees on the path to your home. Use bright outdoor  lighting. Clear any walking paths of anything that might make someone trip, such as rocks or tools. Regularly check to see if handrails are loose or broken. Make sure that both sides of any steps have handrails. Any raised decks and porches should have guardrails on the edges. Have any leaves, snow, or ice cleared regularly. Use sand or salt on walking paths during winter. Clean up any spills in your garage right away. This includes oil or grease spills. What can I do in the bathroom? Use night lights. Install grab bars by the toilet and in the tub and shower. Do not use towel bars as grab bars. Use non-skid mats or decals in the tub or shower. If you need to sit down in the shower, use a plastic, non-slip stool. Keep the floor dry. Clean up any water that spills on the floor as soon as it happens. Remove soap buildup in the tub or shower regularly. Attach bath mats securely with double-sided non-slip rug tape. Do not have throw rugs and other things on the floor that can make you trip. What can I do in the bedroom? Use night lights. Make sure that you have a light by your bed that is easy to reach. Do not use any sheets or blankets that are too big for your bed. They should not hang down onto the floor. Have a firm chair that has side arms. You can use this for support while you get dressed. Do not have throw rugs and other things on the floor that can make you trip.  What can I do in the kitchen? Clean up any spills right away. Avoid walking on wet floors. Keep items that you use a lot in easy-to-reach places. If you need to reach something above you, use a strong step stool that has a grab bar. Keep electrical cords out of the way. Do not use floor polish or wax that makes floors slippery. If you must use wax, use non-skid floor wax. Do not have throw rugs and other things on the floor that can make you trip. What can I do with my stairs? Do not leave any items on the stairs. Make  sure that there are handrails on both sides of the stairs and use them. Fix handrails that are broken or loose. Make sure that handrails are as long as the stairways. Check any carpeting to make sure that it is firmly attached to the stairs. Fix any carpet that is loose or worn. Avoid having throw rugs at the top or bottom of the stairs. If you do have throw rugs, attach them to the floor with carpet tape. Make sure that you have a light switch at the top of the stairs and the bottom of the stairs. If you do not have them, ask someone to add them for you. What else can I do to help prevent falls? Wear shoes that: Do not have high heels. Have rubber bottoms. Are comfortable and fit you well. Are closed at the toe. Do not wear sandals. If you use a stepladder: Make sure that it is fully opened. Do not climb a closed stepladder. Make sure that both sides of the stepladder are locked into place. Ask someone to hold it for you, if possible. Clearly mark and make sure that you can see: Any grab bars or handrails. First and last steps. Where the edge of each step is. Use tools that help you move around (mobility aids) if they are needed. These include: Canes. Walkers. Scooters. Crutches. Turn on the lights when you go into a dark area. Replace any light bulbs as soon as they burn out. Set up your furniture so you have a clear path. Avoid moving your furniture around. If any of your floors are uneven, fix them. If there are any pets around you, be aware of where they are. Review your medicines with your doctor. Some medicines can make you feel dizzy. This can increase your chance of falling. Ask your doctor what other things that you can do to help prevent falls. This information is not intended to replace advice given to you by your health care provider. Make sure you discuss any questions you have with your health care provider. Document Released: 12/30/2008 Document Revised: 08/11/2015  Document Reviewed: 04/09/2014 Elsevier Interactive Patient Education  2017 [employer].

## 2021-10-31 NOTE — Progress Notes (Unsigned)
[facility] HISTORY AND PHYSICAL   History of Present Illness:  Patient is a 66 y.o. year old female who presents for evaluation of PAD.  She is s/p left femoral to below the knee popliteal artery bypass with vein on 07/18/2021 by Dr. Scot Dock due to distal left posterior calf ulceration.   Past Medical History:  Diagnosis Date   Diabetes mellitus without complication ([place])    Hypertension    Retina disorder, bilateral     Past Surgical History:  Procedure Laterality Date   ABDOMINAL AORTOGRAM W/LOWER EXTREMITY N/A 07/07/2021   Procedure: ABDOMINAL AORTOGRAM W/LOWER EXTREMITY;  Surgeon: Angelia Mould, MD;  Location: [facility];  Service: Cardiovascular;  Laterality: N/A;   CESAREAN SECTION     CHOLECYSTECTOMY     COLONOSCOPY  11/11/2007   Dr.Jacobs   EYE SURGERY     bilateral cataracts   FEMORAL-POPLITEAL BYPASS GRAFT Left 07/18/2021   Procedure: LEFT FEMORAL-POPLITEAL ARTERY BYPASS WITH VEIN;  Surgeon: Angelia Mould, MD;  Location: [facility];  Service: Vascular;  Laterality: Left;   FOOT SURGERY     INTRAOPERATIVE ARTERIOGRAM Left 07/18/2021   Procedure: INTRA OPERATIVE ARTERIOGRAM;  Surgeon: Angelia Mould, MD;  Location: [facility];  Service: Vascular;  Laterality: Left;   WRIST SURGERY      ROS:   General:  No weight loss, Fever, chills  HEENT: No recent headaches, no nasal bleeding, no visual changes, no sore throat  Neurologic: No dizziness, blackouts, seizures. No recent symptoms of stroke or mini- stroke. No recent episodes of slurred speech, or temporary blindness.  Cardiac: No recent episodes of chest pain/pressure, no shortness of breath at rest.  No shortness of breath with exertion.  Denies history of atrial fibrillation or irregular heartbeat  Vascular: No history of rest pain in feet.  No history of claudication.  No history of non-healing ulcer, No history of DVT   Pulmonary: No home oxygen, no productive cough,  no hemoptysis,  No asthma or wheezing  Musculoskeletal:  '[ ]'$  Arthritis, '[ ]'$  Low back pain,  '[ ]'$  Joint pain  Hematologic:No history of hypercoagulable state.  No history of easy bleeding.  No history of anemia  Gastrointestinal: No hematochezia or melena,  No gastroesophageal reflux, no trouble swallowing  Urinary: '[ ]'$  chronic Kidney disease, '[ ]'$  on HD - '[ ]'$  MWF or '[ ]'$  TTHS, '[ ]'$  Burning with urination, '[ ]'$  Frequent urination, '[ ]'$  Difficulty urinating;   Skin: No rashes  Psychological: No history of anxiety,  No history of depression  Social History Social History   Tobacco Use   Smoking status: Every Day    Packs/day: 1.00    Types: Cigarettes    Passive exposure: Never   Smokeless tobacco: Never  Vaping Use   Vaping Use: Never used  Substance Use Topics   Alcohol use: Yes    Comment: occ   Drug use: Not Currently    Types: Marijuana    Comment: gummies every 2-3 months    Family History Family History  Problem Relation Age of Onset   Cancer Mother    Liver cancer Mother    Colon polyps Father    Cancer Father    Diabetes Father    Bladder Cancer Father    Colon cancer Neg Hx    Esophageal cancer Neg Hx    Pancreatic cancer Neg Hx    Stomach cancer Neg Hx    Breast cancer Neg Hx  Allergies  Allergies  Allergen Reactions   Codeine     Ringing in ears   Morphine Hives   Other Hives    Sporanox - for nail fungus     Current Outpatient Medications  Medication Sig Dispense Refill   amLODipine (NORVASC) 5 MG tablet Take 1 tablet (5 mg total) by mouth daily. 90 tablet 3   aspirin EC 81 MG tablet Take 81 mg by mouth daily. Swallow whole.     baclofen (LIORESAL) 10 MG tablet Take 1 tablet (10 mg total) by mouth 3 (three) times daily. (Patient taking differently: Take 10 mg by mouth 3 (three) times daily as needed for muscle spasms.) 30 each 0   buPROPion (WELLBUTRIN XL) 150 MG 24 hr tablet TAKE ONE TABLET ONCE DAILY 30 tablet 0   doxycycline (VIBRA-TABS)  100 MG tablet Take 1 tablet (100 mg total) by mouth 2 (two) times daily. 20 tablet 0   ibuprofen (ADVIL,MOTRIN) 200 MG tablet Take 800 mg by mouth every 6 (six) hours as needed for moderate pain.     lisinopril (ZESTRIL) 20 MG tablet Take 2 tablets (40 mg total) by mouth daily. 180 tablet 3   loperamide (IMODIUM) 2 MG capsule Take 2 mg by mouth as needed for diarrhea or loose stools.     Multiple Vitamins-Minerals (AIRBORNE PO) Take 1 tablet by mouth daily.     mupirocin ointment (BACTROBAN) 2 % Apply 1 application. topically 2 (two) times daily. 22 g 0   Omega-3 Fatty Acids (FISH OIL) 1000 MG CAPS Take 1,000 mg by mouth daily.     oxyCODONE-acetaminophen (PERCOCET/ROXICET) 5-325 MG tablet Take 1-2 tablets by mouth every 4 (four) hours as needed for moderate pain. 30 tablet 0   rosuvastatin (CRESTOR) 5 MG tablet Take 1 tablet (5 mg total) by mouth daily. 90 tablet 3   Semaglutide,0.25 or 0.'5MG'$ /DOS, (OZEMPIC, 0.25 OR 0.5 MG/DOSE,) 2 MG/1.5ML SOPN Inject 0.5 mg into the skin once a week. (Patient taking differently: Inject 0.5 mg into the skin every Monday.) 4.5 mL 3   silver sulfADIAZINE (SSD) 1 % cream Apply 1 application. topically daily. (Patient not taking: Reported on 10/30/2021) 50 g 0   tobramycin (TOBREX) 0.3 % ophthalmic solution Place 1 drop into both eyes See admin instructions. Used before eye injections every 3 months     varenicline (CHANTIX CONTINUING MONTH PAK) 1 MG tablet Take 1 tablet (1 mg total) by mouth 2 (two) times daily. (Patient not taking: Reported on 10/30/2021) 60 tablet 2   No current facility-administered medications for this visit.    Physical Examination  There were no vitals filed for this visit.  There is no height or weight on file to calculate BMI.  General:  Alert and oriented, no acute distress HEENT: Normal Neck: No bruit or JVD Pulmonary: Clear to auscultation bilaterally Cardiac: Regular Rate and Rhythm without murmur Abdomen: Soft, non-tender,  non-distended, no mass, no scars Skin: No rash Extremity Pulses:  2+ radial, brachial, femoral, dorsalis pedis, posterior tibial pulses bilaterally Musculoskeletal: No deformity or edema  Neurologic: Upper and lower extremity motor 5/5 and symmetric  DATA: ***   ASSESSMENT: ***   PLAN: ***   Roxy Horseman PA-C [facility] Office: [phone]  MD in clinic [place]

## 2021-11-02 ENCOUNTER — Encounter: Payer: Self-pay | Admitting: Physician Assistant

## 2021-11-02 ENCOUNTER — Ambulatory Visit (INDEPENDENT_AMBULATORY_CARE_PROVIDER_SITE_OTHER)
Admission: RE | Admit: 2021-11-02 | Discharge: 2021-11-02 | Disposition: A | Discharge status: Home / Self Care | Payer: Medicare HMO | Source: Ambulatory Visit | Attending: Vascular Surgery | Admitting: Vascular Surgery

## 2021-11-02 ENCOUNTER — Ambulatory Visit: Payer: Medicare HMO | Admitting: Physician Assistant

## 2021-11-02 ENCOUNTER — Ambulatory Visit (HOSPITAL_COMMUNITY)
Admission: RE | Admit: 2021-11-02 | Discharge: 2021-11-02 | Disposition: A | Discharge status: Home / Self Care | Payer: Medicare HMO | Source: Ambulatory Visit | Attending: Vascular Surgery | Admitting: Vascular Surgery

## 2021-11-02 VITALS — BP 157/74 | HR 57 | Temp 98.2°F | Resp 20 | Ht 64.0 in | Wt 197.4 lb

## 2021-11-02 DIAGNOSIS — I739 Peripheral vascular disease, unspecified: Secondary | ICD-10-CM

## 2021-11-03 ENCOUNTER — Encounter: Payer: Self-pay | Admitting: Family Medicine

## 2021-11-03 ENCOUNTER — Ambulatory Visit (INDEPENDENT_AMBULATORY_CARE_PROVIDER_SITE_OTHER): Payer: Medicare HMO | Admitting: Family Medicine

## 2021-11-03 ENCOUNTER — Ambulatory Visit (INDEPENDENT_AMBULATORY_CARE_PROVIDER_SITE_OTHER): Payer: Medicare HMO

## 2021-11-03 VITALS — BP 145/72 | HR 57 | Temp 98.0°F | Ht 64.0 in | Wt 196.0 lb

## 2021-11-03 DIAGNOSIS — Z780 Asymptomatic menopausal state: Secondary | ICD-10-CM

## 2021-11-03 DIAGNOSIS — L0291 Cutaneous abscess, unspecified: Secondary | ICD-10-CM | POA: Diagnosis not present

## 2021-11-03 DIAGNOSIS — M85851 Other specified disorders of bone density and structure, right thigh: Secondary | ICD-10-CM | POA: Diagnosis not present

## 2021-11-03 DIAGNOSIS — M810 Age-related osteoporosis without current pathological fracture: Secondary | ICD-10-CM | POA: Diagnosis not present

## 2021-11-03 NOTE — Addendum Note (Signed)
Addended by: Lanier Prude D on: 11/03/2021 09:39 AM   Modules accepted: Orders

## 2021-11-03 NOTE — Progress Notes (Signed)
BP (!) 145/72   Pulse (!) 57   Temp 98 F (36.7 C)   Ht '5\' 4"'$  (1.626 m)   Wt 196 lb (88.9 kg)   SpO2 99%   BMI 33.64 kg/m    Subjective:   Patient ID: Kimberly Donaldson, female    DOB: 05-28-1955, 66 y.o.   MRN: [id number]  HPI: Kimberly Donaldson is a 66 y.o. female presenting on 11/03/2021 for Rash left inguinal fold (H/O psoriasis- not treated )   HPI Left lower abdominal wound Patient is coming today for left lower abdominal wound.  She says it has improved some but still not resolved.  She has been on the antibiotic for the past 4 days and still has a week left.  She denies any fevers or chills.  It is on her left lower abdominal area.  Relevant past medical, surgical, family and social history reviewed and updated as indicated. Interim medical history since our last visit reviewed. Allergies and medications reviewed and updated.  Review of Systems  Constitutional:  Negative for chills and fever.  Eyes:  Negative for visual disturbance.  Respiratory:  Negative for chest tightness and shortness of breath.   Cardiovascular:  Negative for chest pain and leg swelling.  Musculoskeletal:  Negative for back pain and gait problem.  Skin:  Positive for color change, rash and wound.  Psychiatric/Behavioral:  Negative for agitation and behavioral problems.   All other systems reviewed and are negative.   Per HPI unless specifically indicated above   Allergies as of 11/03/2021       Reactions   Codeine    Ringing in ears   Morphine Hives   Other Hives   Sporanox - for nail fungus        Medication List        Accurate as of November 03, 2021  9:34 AM. If you have any questions, ask your nurse or doctor.          STOP taking these medications    mupirocin ointment 2 % Commonly known as: BACTROBAN Stopped by: Fransisca Kaufmann Abraham Entwistle, MD       TAKE these medications    AIRBORNE PO Take 1 tablet by mouth daily.   amLODipine 5 MG tablet Commonly known as:  NORVASC Take 1 tablet (5 mg total) by mouth daily.   aspirin EC 81 MG tablet Take 81 mg by mouth daily. Swallow whole.   buPROPion 150 MG 24 hr tablet Commonly known as: WELLBUTRIN XL TAKE ONE TABLET ONCE DAILY   doxycycline 100 MG tablet Commonly known as: VIBRA-TABS Take 1 tablet (100 mg total) by mouth 2 (two) times daily.   Fish Oil 1000 MG Caps Take 1,000 mg by mouth daily.   ibuprofen 200 MG tablet Commonly known as: ADVIL Take 800 mg by mouth every 6 (six) hours as needed for moderate pain.   lisinopril 20 MG tablet Commonly known as: ZESTRIL Take 2 tablets (40 mg total) by mouth daily.   loperamide 2 MG capsule Commonly known as: IMODIUM Take 2 mg by mouth as needed for diarrhea or loose stools.   Ozempic (0.25 or 0.5 MG/DOSE) 2 MG/1.5ML Sopn Generic drug: Semaglutide(0.25 or 0.'5MG'$ /DOS) Inject 0.5 mg into the skin once a week. What changed: when to take this   rosuvastatin 5 MG tablet Commonly known as: Crestor Take 1 tablet (5 mg total) by mouth daily.   tobramycin 0.3 % ophthalmic solution Commonly known as: TOBREX Place 1 drop into  both eyes See admin instructions. Used before eye injections every 3 months         Objective:   BP (!) 145/72   Pulse (!) 57   Temp 98 F (36.7 C)   Ht '5\' 4"'$  (1.626 m)   Wt 196 lb (88.9 kg)   SpO2 99%   BMI 33.64 kg/m   Wt Readings from Last 3 Encounters:  11/03/21 196 lb (88.9 kg)  11/02/21 197 lb 6.4 oz (89.5 kg)  10/31/21 199 lb (90.3 kg)    Physical Exam Vitals and nursing note reviewed.  Constitutional:      Appearance: Normal appearance.  Skin:    Findings: Lesion (2 and half centimeter eschar with induration and fluctuation and erythema surrounding and underneath it.) and rash (Psoriatic plaques on left lower abdomen) present.  Neurological:     Mental Status: She is alert.     I&D: Region was anesthetized with topical anesthesia spray. Incision was made on anterior medial aspect of the wound.   Small amount of serosanguineous and purulent drainage was exuded. Culture was taken. Forceps was used to probe the area and break apart any loculations.  Simple bandage with 4 x 4's and tape was placed over top. Bleeding was minimal and patient tolerated procedure well.   Assessment & Plan:   Problem List Items Addressed This Visit   None Visit Diagnoses     Abscess    -  Primary       Continue doxycycline, continue to change dressing daily and while in the shower express drainage. Follow up plan: Return if symptoms worsen or fail to improve, for 1 to 2 weeks.  Counseling provided for all of the vaccine components No orders of the defined types were placed in this encounter.   Caryl Pina, MD [facility] 11/03/2021, 9:34 AM

## 2021-11-10 ENCOUNTER — Inpatient Hospital Stay: Admission: RE | Admit: 2021-11-10 | Payer: Medicare HMO | Source: Ambulatory Visit

## 2021-11-14 ENCOUNTER — Other Ambulatory Visit: Payer: Self-pay | Admitting: Family Medicine

## 2021-11-14 DIAGNOSIS — Z716 Tobacco abuse counseling: Secondary | ICD-10-CM

## 2021-11-17 ENCOUNTER — Ambulatory Visit (INDEPENDENT_AMBULATORY_CARE_PROVIDER_SITE_OTHER): Payer: Medicare HMO | Admitting: Family Medicine

## 2021-11-17 ENCOUNTER — Encounter: Payer: Self-pay | Admitting: Family Medicine

## 2021-11-17 VITALS — BP 130/57 | HR 64 | Wt 190.0 lb

## 2021-11-17 DIAGNOSIS — H43813 Vitreous degeneration, bilateral: Secondary | ICD-10-CM | POA: Diagnosis not present

## 2021-11-17 DIAGNOSIS — L0291 Cutaneous abscess, unspecified: Secondary | ICD-10-CM | POA: Diagnosis not present

## 2021-11-17 DIAGNOSIS — E113312 Type 2 diabetes mellitus with moderate nonproliferative diabetic retinopathy with macular edema, left eye: Secondary | ICD-10-CM | POA: Diagnosis not present

## 2021-11-17 DIAGNOSIS — M810 Age-related osteoporosis without current pathological fracture: Secondary | ICD-10-CM

## 2021-11-17 DIAGNOSIS — T63304A Toxic effect of unspecified spider venom, undetermined, initial encounter: Secondary | ICD-10-CM

## 2021-11-17 DIAGNOSIS — E113411 Type 2 diabetes mellitus with severe nonproliferative diabetic retinopathy with macular edema, right eye: Secondary | ICD-10-CM | POA: Diagnosis not present

## 2021-11-17 MED ORDER — DOXYCYCLINE HYCLATE 100 MG PO TABS: 100.0000 mg | ORAL_TABLET | Freq: Two times a day (BID) | ORAL | 0 refills | Status: DC

## 2021-11-17 MED ORDER — ALENDRONATE SODIUM 70 MG PO TABS: 70.0000 mg | ORAL_TABLET | ORAL | 11 refills | Status: DC

## 2021-11-17 NOTE — Progress Notes (Signed)
BP (!) 130/57   Pulse 64   Wt 190 lb (86.2 kg)   SpO2 95%   BMI 32.61 kg/m    Subjective:   Patient ID: Kimberly Donaldson, female    DOB: 01-30-1956, 66 y.o.   MRN: [id number]  HPI: Kimberly Donaldson is a 66 y.o. female presenting on 11/17/2021 for Wound Check (/)   HPI Left lower abdominal wound recheck Patient is coming in for left lower abdominal wound recheck.  She says it has not been draining and is getting smaller and is not tender and she feels like is doing better  She did forget the antibiotic yesterday for  Relevant past medical, surgical, family and social history reviewed and updated as indicated. Interim medical history since our last visit reviewed. Allergies and medications reviewed and updated.  Review of Systems  Constitutional:  Negative for chills and fever.  Eyes:  Negative for visual disturbance.  Respiratory:  Negative for chest tightness and shortness of breath.   Cardiovascular:  Negative for chest pain and leg swelling.  Musculoskeletal:  Negative for back pain and gait problem.  Skin:  Positive for color change and wound. Negative for rash.  Neurological:  Negative for light-headedness and headaches.  Psychiatric/Behavioral:  Negative for agitation and behavioral problems.   All other systems reviewed and are negative.   Per HPI unless specifically indicated above   Allergies as of 11/17/2021       Reactions   Codeine    Ringing in ears   Morphine Hives   Other Hives   Sporanox - for nail fungus        Medication List        Accurate as of November 17, 2021  4:14 PM. If you have any questions, ask your nurse or doctor.          AIRBORNE PO Take 1 tablet by mouth daily.   amLODipine 5 MG tablet Commonly known as: NORVASC Take 1 tablet (5 mg total) by mouth daily.   aspirin EC 81 MG tablet Take 81 mg by mouth daily. Swallow whole.   buPROPion 150 MG 24 hr tablet Commonly known as: WELLBUTRIN XL TAKE ONE TABLET ONCE DAILY    doxycycline 100 MG tablet Commonly known as: VIBRA-TABS Take 1 tablet (100 mg total) by mouth 2 (two) times daily.   Fish Oil 1000 MG Caps Take 1,000 mg by mouth daily.   ibuprofen 200 MG tablet Commonly known as: ADVIL Take 800 mg by mouth every 6 (six) hours as needed for moderate pain.   lisinopril 20 MG tablet Commonly known as: ZESTRIL Take 2 tablets (40 mg total) by mouth daily.   loperamide 2 MG capsule Commonly known as: IMODIUM Take 2 mg by mouth as needed for diarrhea or loose stools.   Ozempic (0.25 or 0.5 MG/DOSE) 2 MG/1.5ML Sopn Generic drug: Semaglutide(0.25 or 0.'5MG'$ /DOS) Inject 0.5 mg into the skin once a week. What changed: when to take this   rosuvastatin 5 MG tablet Commonly known as: Crestor Take 1 tablet (5 mg total) by mouth daily.   tobramycin 0.3 % ophthalmic solution Commonly known as: TOBREX Place 1 drop into both eyes See admin instructions. Used before eye injections every 3 months         Objective:   BP (!) 130/57   Pulse 64   Wt 190 lb (86.2 kg)   SpO2 95%   BMI 32.61 kg/m   Wt Readings from Last 3 Encounters:  11/17/21  190 lb (86.2 kg)  11/03/21 196 lb (88.9 kg)  11/02/21 197 lb 6.4 oz (89.5 kg)    Physical Exam Vitals and nursing note reviewed.  Constitutional:      Appearance: Normal appearance.  Skin:    Findings: Erythema and lesion (Smaller eschar, about half in size compared to what it was previously on left lower abdomen.  No induration and only a small amount of erythema surrounding it.) present.  Neurological:     Mental Status: She is alert.       Assessment & Plan:   Problem List Items Addressed This Visit   None Visit Diagnoses     Abscess    -  Primary   Relevant Medications   doxycycline (VIBRA-TABS) 100 MG tablet   Spider bite wound, undetermined intent, initial encounter       Relevant Medications   doxycycline (VIBRA-TABS) 100 MG tablet   Age-related osteoporosis without current pathological  fracture       Relevant Medications   alendronate (FOSAMAX) 70 MG tablet       Do 1 more round of the doxycycline  Will start fosamax after seeing dentist  Follow up plan: Return in about 2 weeks (around 12/01/2021), or if symptoms worsen or fail to improve, for 2 to 3-week wound recheck.  Counseling provided for all of the vaccine components No orders of the defined types were placed in this encounter.   Caryl Pina, MD [facility] 11/17/2021, 4:14 PM

## 2021-11-21 ENCOUNTER — Encounter: Payer: Self-pay | Admitting: Family Medicine

## 2021-11-21 ENCOUNTER — Ambulatory Visit (INDEPENDENT_AMBULATORY_CARE_PROVIDER_SITE_OTHER): Payer: Medicare HMO | Admitting: Family Medicine

## 2021-11-21 VITALS — BP 177/69 | HR 59 | Temp 97.3°F | Resp 20 | Ht 64.0 in | Wt 192.0 lb

## 2021-11-21 DIAGNOSIS — M79671 Pain in right foot: Secondary | ICD-10-CM | POA: Diagnosis not present

## 2021-11-21 DIAGNOSIS — I10 Essential (primary) hypertension: Secondary | ICD-10-CM

## 2021-11-21 MED ORDER — PREDNISONE 20 MG PO TABS: 40.0000 mg | ORAL_TABLET | Freq: Every day | ORAL | 0 refills | Status: AC

## 2021-11-21 NOTE — Progress Notes (Signed)
Assessment & Plan:  1. Pain of right heel Suspect likely a bone spur, but patient declines imaging at this time.  She will reconsider if the pain continues.  Treating with steroids to reduce inflammation and control pain.  Education provided on heel spurs. - predniSONE (DELTASONE) 20 MG tablet; Take 2 tablets (40 mg total) by mouth daily with breakfast for 5 days.  Dispense: 10 tablet; Refill: 0  2. Primary hypertension Patient reports she just took her medication before coming into the office.  She was encouraged to monitor her blood pressure at home and let us know if she is staying >140/90.   Follow up plan: Return if symptoms worsen or fail to improve.  Hendricks Limes, MSN, APRN, FNP-C [facility]  Subjective:   Patient ID: Kimberly Donaldson, female    DOB: 12/21/1955, 66 y.o.   MRN: [id number]  HPI: Kimberly Donaldson is a 66 y.o. female presenting on 11/21/2021 for right heel pain ("Tendon pain " started Saturday PM - no injury)  Patient reports right heel/tendon pain that started three days ago. Denies any injury. Describes pain as "hurts like hell"; rates pain as 9/10. Walking and weight bearing makes pain worse. Rest makes pain better. No home treatments.  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   ROS: Negative unless specifically indicated above in HPI.   Relevant past medical history reviewed and updated as indicated.   Allergies and medications reviewed and updated.   Current Outpatient Medications:    amLODipine (NORVASC) 5 MG tablet, Take 1 tablet (5 mg total) by mouth daily., Disp: 90 tablet, Rfl: 3   aspirin EC 81 MG tablet, Take 81 mg by mouth daily. Swallow whole., Disp: , Rfl:    buPROPion (WELLBUTRIN XL) 150 MG 24 hr tablet, TAKE ONE TABLET ONCE DAILY, Disp: 30 tablet, Rfl: 5   doxycycline (VIBRA-TABS) 100 MG tablet, Take 1 tablet (100 mg total) by mouth 2 (two) times daily., Disp: 20 tablet, Rfl: 0    ibuprofen (ADVIL,MOTRIN) 200 MG tablet, Take 800 mg by mouth every 6 (six) hours as needed for moderate pain., Disp: , Rfl:    lisinopril (ZESTRIL) 20 MG tablet, Take 2 tablets (40 mg total) by mouth daily., Disp: 180 tablet, Rfl: 3   loperamide (IMODIUM) 2 MG capsule, Take 2 mg by mouth as needed for diarrhea or loose stools., Disp: , Rfl:    Multiple Vitamins-Minerals (AIRBORNE PO), Take 1 tablet by mouth daily., Disp: , Rfl:    Omega-3 Fatty Acids (FISH OIL) 1000 MG CAPS, Take 1,000 mg by mouth daily., Disp: , Rfl:    rosuvastatin (CRESTOR) 5 MG tablet, Take 1 tablet (5 mg total) by mouth daily., Disp: 90 tablet, Rfl: 3   Semaglutide,0.25 or 0.'5MG'$ /DOS, (OZEMPIC, 0.25 OR 0.5 MG/DOSE,) 2 MG/1.5ML SOPN, Inject 0.5 mg into the skin once a week. (Patient taking differently: Inject 0.5 mg into the skin every Monday.), Disp: 4.5 mL, Rfl: 3   tobramycin (TOBREX) 0.3 % ophthalmic solution, Place 1 drop into both eyes See admin instructions. Used before eye injections every 3 months, Disp: , Rfl:    alendronate (FOSAMAX) 70 MG tablet, Take 1 tablet (70 mg total) by mouth every 7 (seven) days. Take with a full glass of water on an empty stomach. (Patient not taking: Reported on 11/21/2021), Disp: 4 tablet, Rfl: 11  Allergies  Allergen Reactions   Codeine     Ringing  in ears   Morphine Hives   Other Hives    Sporanox - for nail fungus    Objective:   BP (!) 177/69   Pulse (!) 59   Temp (!) 97.3 F (36.3 C)   Resp 20   Ht '5\' 4"'$  (1.626 m)   Wt 192 lb (87.1 kg)   SpO2 97%   BMI 32.96 kg/m    Physical Exam Vitals reviewed.  Constitutional:      General: She is not in acute distress.    Appearance: Normal appearance. She is not ill-appearing, toxic-appearing or diaphoretic.  HENT:     Head: Normocephalic and atraumatic.  Eyes:     General: No scleral icterus.       Right eye: No discharge.        Left eye: No discharge.     Conjunctiva/sclera: Conjunctivae normal.  Cardiovascular:      Rate and Rhythm: Normal rate.  Pulmonary:     Effort: Pulmonary effort is normal. No respiratory distress.  Musculoskeletal:        General: Normal range of motion.     Cervical back: Normal range of motion.     Right foot: Tenderness (throughout heel) present.  Skin:    General: Skin is warm and dry.     Capillary Refill: Capillary refill takes less than 2 seconds.  Neurological:     General: No focal deficit present.     Mental Status: She is alert and oriented to person, place, and time. Mental status is at baseline.  Psychiatric:        Mood and Affect: Mood normal.        Behavior: Behavior normal.        Thought Content: Thought content normal.        Judgment: Judgment normal.

## 2021-12-04 ENCOUNTER — Encounter: Payer: Self-pay | Admitting: Family Medicine

## 2021-12-04 ENCOUNTER — Ambulatory Visit (INDEPENDENT_AMBULATORY_CARE_PROVIDER_SITE_OTHER): Payer: Medicare HMO | Admitting: Family Medicine

## 2021-12-04 ENCOUNTER — Other Ambulatory Visit: Payer: Self-pay | Admitting: Family Medicine

## 2021-12-04 VITALS — BP 158/64 | HR 61 | Temp 97.0°F | Ht 64.0 in | Wt 191.0 lb

## 2021-12-04 DIAGNOSIS — Z716 Tobacco abuse counseling: Secondary | ICD-10-CM

## 2021-12-04 DIAGNOSIS — T63304D Toxic effect of unspecified spider venom, undetermined, subsequent encounter: Secondary | ICD-10-CM | POA: Diagnosis not present

## 2021-12-04 DIAGNOSIS — T148XXA Other injury of unspecified body region, initial encounter: Secondary | ICD-10-CM

## 2021-12-04 MED ORDER — SANTYL 250 UNIT/GM EX OINT: 1.0000 | TOPICAL_OINTMENT | Freq: Every day | CUTANEOUS | 0 refills | Status: DC

## 2021-12-04 MED ORDER — ROSUVASTATIN CALCIUM 5 MG PO TABS: 5.0000 mg | ORAL_TABLET | Freq: Every day | ORAL | 3 refills | Status: DC

## 2021-12-04 MED ORDER — OZEMPIC (0.25 OR 0.5 MG/DOSE) 2 MG/1.5ML ~~LOC~~ SOPN: 0.5000 mg | PEN_INJECTOR | SUBCUTANEOUS | 3 refills | Status: DC

## 2021-12-04 MED ORDER — LISINOPRIL 20 MG PO TABS
40.0000 mg | ORAL_TABLET | Freq: Every day | ORAL | 3 refills | Status: DC
Start: 2021-12-04 — End: 2022-07-13

## 2021-12-04 MED ORDER — AMLODIPINE BESYLATE 5 MG PO TABS: 5.0000 mg | ORAL_TABLET | Freq: Every day | ORAL | 3 refills | Status: DC

## 2021-12-04 NOTE — Progress Notes (Signed)
BP (!) 158/64   Pulse 61   Temp (!) 97 F (36.1 C)   Ht '5\' 4"'$  (1.626 m)   Wt 191 lb (86.6 kg)   SpO2 100%   BMI 32.79 kg/m    Subjective:   Patient ID: Kimberly Donaldson, female    DOB: 1955-11-28, 66 y.o.   MRN: [id number]  HPI: Kimberly Donaldson is a 66 y.o. female presenting on 12/04/2021 for Wound Check (Abscess- 3 week follow up. Not improving per pt)   HPI Abscess follow-up Left lower abdominal abscess/wound.  Patient is coming in for recheck of this today.  She did take the doxycycline.  She still having a lot of pruritus with the wound and feels like it is better but not fully there.  It does feel like it is healing, she finished the antibiotic and she has not had any purulent drainage.  She does get some scabbing over it.  She does get some pruritus with it.  She does feel like it is getting better and smaller but slowly.  Relevant past medical, surgical, family and social history reviewed and updated as indicated. Interim medical history since our last visit reviewed. Allergies and medications reviewed and updated.  Review of Systems  Constitutional:  Negative for chills and fever.  Skin:  Positive for wound. Negative for color change and rash.    Per HPI unless specifically indicated above   Allergies as of 12/04/2021       Reactions   Codeine    Ringing in ears   Morphine Hives   Other Hives   Sporanox - for nail fungus        Medication List        Accurate as of December 04, 2021  9:21 AM. If you have any questions, ask your nurse or doctor.          STOP taking these medications    doxycycline 100 MG tablet Commonly known as: VIBRA-TABS Stopped by: Fransisca Kaufmann Swati Granberry, MD       TAKE these medications    AIRBORNE PO Take 1 tablet by mouth daily.   alendronate 70 MG tablet Commonly known as: FOSAMAX Take 1 tablet (70 mg total) by mouth every 7 (seven) days. Take with a full glass of water on an empty stomach.   amLODipine 5 MG  tablet Commonly known as: NORVASC Take 1 tablet (5 mg total) by mouth daily.   aspirin EC 81 MG tablet Take 81 mg by mouth daily. Swallow whole.   buPROPion 150 MG 24 hr tablet Commonly known as: WELLBUTRIN XL TAKE ONE TABLET ONCE DAILY   Fish Oil 1000 MG Caps Take 1,000 mg by mouth daily.   ibuprofen 200 MG tablet Commonly known as: ADVIL Take 800 mg by mouth every 6 (six) hours as needed for moderate pain.   lisinopril 20 MG tablet Commonly known as: ZESTRIL Take 2 tablets (40 mg total) by mouth daily.   loperamide 2 MG capsule Commonly known as: IMODIUM Take 2 mg by mouth as needed for diarrhea or loose stools.   Ozempic (0.25 or 0.5 MG/DOSE) 2 MG/1.5ML Sopn Generic drug: Semaglutide(0.25 or 0.'5MG'$ /DOS) Inject 0.5 mg into the skin once a week. What changed: when to take this   rosuvastatin 5 MG tablet Commonly known as: Crestor Take 1 tablet (5 mg total) by mouth daily.   Santyl 250 UNIT/GM ointment Generic drug: collagenase Apply 1 Application topically daily. Started by: [name], MD  tobramycin 0.3 % ophthalmic solution Commonly known as: TOBREX Place 1 drop into both eyes See admin instructions. Used before eye injections every 3 months         Objective:   BP (!) 158/64   Pulse 61   Temp (!) 97 F (36.1 C)   Ht '5\' 4"'$  (1.626 m)   Wt 191 lb (86.6 kg)   SpO2 100%   BMI 32.79 kg/m   Wt Readings from Last 3 Encounters:  12/04/21 191 lb (86.6 kg)  11/21/21 192 lb (87.1 kg)  11/17/21 190 lb (86.2 kg)    Physical Exam Vitals and nursing note reviewed.  Constitutional:      Appearance: Normal appearance. She is obese.  Skin:    Findings: Wound (Left lower abdominal wound, opening is now three quarters of a centimeter round, no induration or fluctuation noted underneath.  Nontender, nonerythematous) present.  Neurological:     Mental Status: She is alert.     Assessment & Plan:   Problem List Items Addressed This Visit    None Visit Diagnoses     Wound of skin    -  Primary   Relevant Medications   collagenase (SANTYL) 250 UNIT/GM ointment       We will give patient Santyl to help accelerate the wound care, keep an eye for any signs of infection. Follow up plan: Return in about 3 months (around 03/05/2022), or if symptoms worsen or fail to improve, for 2 to 3-week wound check.  Counseling provided for all of the vaccine components No orders of the defined types were placed in this encounter.   Caryl Pina, MD [facility] 12/04/2021, 9:21 AM

## 2021-12-05 ENCOUNTER — Other Ambulatory Visit: Payer: Self-pay

## 2021-12-05 ENCOUNTER — Telehealth: Payer: Self-pay

## 2021-12-05 DIAGNOSIS — T148XXA Other injury of unspecified body region, initial encounter: Secondary | ICD-10-CM

## 2021-12-05 MED ORDER — SANTYL 250 UNIT/GM EX OINT: 1.0000 | TOPICAL_OINTMENT | Freq: Every day | CUTANEOUS | 0 refills | Status: AC

## 2021-12-05 NOTE — Telephone Encounter (Signed)
(  Key: [id number])  Your information has been sent to [facility].  Tried Mupirocin and OTC ointments.  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

## 2021-12-05 NOTE — Telephone Encounter (Signed)
Kimberly Donaldson KeyDarrol Poke - PA Case ID: [id number] - Rx #: [id number] Need help? Call us at [phone] Outcome Approvedtoday PA Case: [id number], Status: Approved, Coverage Starts on: 12/05/2021 1:49:47 PM, Coverage Ends on: 12/05/2021 1:49:47 PM. Questions? Contact [phone]. Drug Santyl 250UNIT/GM ointment Form Administrator, sports PA Form  Pharmacy informed

## 2021-12-05 NOTE — Progress Notes (Signed)
Stew called from [facility] stating that the Santyl ointment was approved for the 30g not 15g since they do not make a 15. Chart updated

## 2021-12-06 ENCOUNTER — Ambulatory Visit
Admission: RE | Admit: 2021-12-06 | Discharge: 2021-12-06 | Disposition: A | Discharge status: Home / Self Care | Payer: Medicare HMO | Source: Ambulatory Visit | Attending: Family Medicine | Admitting: Family Medicine

## 2021-12-06 DIAGNOSIS — Z1231 Encounter for screening mammogram for malignant neoplasm of breast: Secondary | ICD-10-CM

## 2021-12-21 ENCOUNTER — Ambulatory Visit (INDEPENDENT_AMBULATORY_CARE_PROVIDER_SITE_OTHER): Payer: Medicare HMO | Admitting: Family Medicine

## 2021-12-21 ENCOUNTER — Encounter: Payer: Self-pay | Admitting: Family Medicine

## 2021-12-21 VITALS — BP 139/51 | HR 66 | Temp 98.4°F | Ht 64.0 in | Wt 191.0 lb

## 2021-12-21 DIAGNOSIS — L02214 Cutaneous abscess of groin: Secondary | ICD-10-CM

## 2021-12-21 DIAGNOSIS — L739 Follicular disorder, unspecified: Secondary | ICD-10-CM | POA: Diagnosis not present

## 2021-12-21 DIAGNOSIS — T148XXA Other injury of unspecified body region, initial encounter: Secondary | ICD-10-CM

## 2021-12-21 MED ORDER — SULFAMETHOXAZOLE-TRIMETHOPRIM 800-160 MG PO TABS: 1.0000 | ORAL_TABLET | Freq: Two times a day (BID) | ORAL | 0 refills | Status: DC

## 2021-12-21 NOTE — Progress Notes (Signed)
BP (!) 139/51   Pulse 66   Temp 98.4 F (36.9 C)   Ht '5\' 4"'$  (1.626 m)   Wt 191 lb (86.6 kg)   SpO2 99%   BMI 32.79 kg/m    Subjective:   Patient ID: Kimberly Donaldson, female    DOB: 04-Dec-1955, 66 y.o.   MRN: [id number]  HPI: Kimberly Donaldson is a 66 y.o. female presenting on 12/21/2021 for Wound Check (Skin fold-abd, improving per pt. Using Santyl) and Cyst (Left axilla, left vaginal area- new since last visit)   HPI Left vaginal cyst and left axillary cyst Patient complains of the left vaginal cyst that is irritated and painful and left axillary cyst that are both new and irritated and painful to arisen recently.  Patient is also coming in for wound recheck and she continues to the [place].  She says is essentially healed up and has only a small crack left in the skin.  She feels like that is doing well.  Relevant past medical, surgical, family and social history reviewed and updated as indicated. Interim medical history since our last visit reviewed. Allergies and medications reviewed and updated.  Review of Systems  Constitutional:  Negative for chills and fever.  Eyes:  Negative for visual disturbance.  Respiratory:  Negative for chest tightness and shortness of breath.   Cardiovascular:  Negative for chest pain and leg swelling.  Genitourinary:  Positive for genital sores.  Musculoskeletal:  Negative for back pain and gait problem.  Skin:  Positive for color change and wound. Negative for rash.  Neurological:  Negative for light-headedness and headaches.  Psychiatric/Behavioral:  Negative for agitation and behavioral problems.   All other systems reviewed and are negative.   Per HPI unless specifically indicated above   Allergies as of 12/21/2021       Reactions   Codeine    Ringing in ears   Morphine Hives   Other Hives   Sporanox - for nail fungus        Medication List        Accurate as of December 21, 2021  9:29 AM. If you have any questions, ask your  nurse or doctor.          AIRBORNE PO Take 1 tablet by mouth daily.   alendronate 70 MG tablet Commonly known as: FOSAMAX Take 1 tablet (70 mg total) by mouth every 7 (seven) days. Take with a full glass of water on an empty stomach.   amLODipine 5 MG tablet Commonly known as: NORVASC Take 1 tablet (5 mg total) by mouth daily.   aspirin EC 81 MG tablet Take 81 mg by mouth daily. Swallow whole.   buPROPion 150 MG 24 hr tablet Commonly known as: WELLBUTRIN XL TAKE ONE TABLET ONCE DAILY   Fish Oil 1000 MG Caps Take 1,000 mg by mouth daily.   ibuprofen 200 MG tablet Commonly known as: ADVIL Take 800 mg by mouth every 6 (six) hours as needed for moderate pain.   lisinopril 20 MG tablet Commonly known as: ZESTRIL Take 2 tablets (40 mg total) by mouth daily.   loperamide 2 MG capsule Commonly known as: IMODIUM Take 2 mg by mouth as needed for diarrhea or loose stools.   Ozempic (0.25 or 0.5 MG/DOSE) 2 MG/1.5ML Sopn Generic drug: Semaglutide(0.25 or 0.'5MG'$ /DOS) Inject 0.5 mg into the skin once a week.   rosuvastatin 5 MG tablet Commonly known as: Crestor Take 1 tablet (5 mg total) by mouth daily.  Santyl 250 UNIT/GM ointment Generic drug: collagenase Apply 1 Application topically daily.   sulfamethoxazole-trimethoprim 800-160 MG tablet Commonly known as: BACTRIM DS Take 1 tablet by mouth 2 (two) times daily. Started by: Kimberly Donaldson Kimberly Cumpton, MD   tobramycin 0.3 % ophthalmic solution Commonly known as: TOBREX Place 1 drop into both eyes See admin instructions. Used before eye injections every 3 months         Objective:   BP (!) 139/51   Pulse 66   Temp 98.4 F (36.9 C)   Ht '5\' 4"'$  (1.626 m)   Wt 191 lb (86.6 kg)   SpO2 99%   BMI 32.79 kg/m   Wt Readings from Last 3 Encounters:  12/21/21 191 lb (86.6 kg)  12/04/21 191 lb (86.6 kg)  11/21/21 192 lb (87.1 kg)    Physical Exam Vitals and nursing note reviewed.  Constitutional:      General: She is  not in acute distress.    Appearance: She is well-developed. She is not diaphoretic.  Eyes:     Conjunctiva/sclera: Conjunctivae normal.  Skin:    General: Skin is warm and dry.     Findings: Erythema (Erythema and abscess in left groin, purulent drainage) and lesion (Left axillary abscess, small pinpoint and erythematous) present. No rash.  Neurological:     Mental Status: She is alert and oriented to person, place, and time.     Coordination: Coordination normal.  Psychiatric:        Behavior: Behavior normal.       Assessment & Plan:   Problem List Items Addressed This Visit   None Visit Diagnoses     Abscess of left groin    -  Primary   Relevant Medications   sulfamethoxazole-trimethoprim (BACTRIM DS) 242-683 MG tablet   Folliculitis of left axilla       Relevant Medications   sulfamethoxazole-trimethoprim (BACTRIM DS) 800-160 MG tablet   Wound of skin           Left lower abdominal wound that we have been working on is essentially healed, she still has some psoriasis in the area but the wound is healed Follow up plan: Return if symptoms worsen or fail to improve.  Counseling provided for all of the vaccine components No orders of the defined types were placed in this encounter.   Kimberly Pina, MD [facility] 12/21/2021, 9:29 AM

## 2021-12-22 IMAGING — MG MM DIGITAL SCREENING BILAT W/ TOMO AND CAD
6 of 12 series · 6 of 36 positions shown · non-contrast
Comparison: Previous exam(s).

CLINICAL DATA: Screening.

EXAM:
DIGITAL SCREENING BILATERAL MAMMOGRAM WITH TOMOSYNTHESIS AND CAD
TECHNIQUE: Bilateral screening digital craniocaudal and mediolateral oblique
mammograms were obtained. Bilateral screening digital breast
tomosynthesis was performed. The images were evaluated with
computer-aided detection.

[R CC synth-2D (1 of 2)]
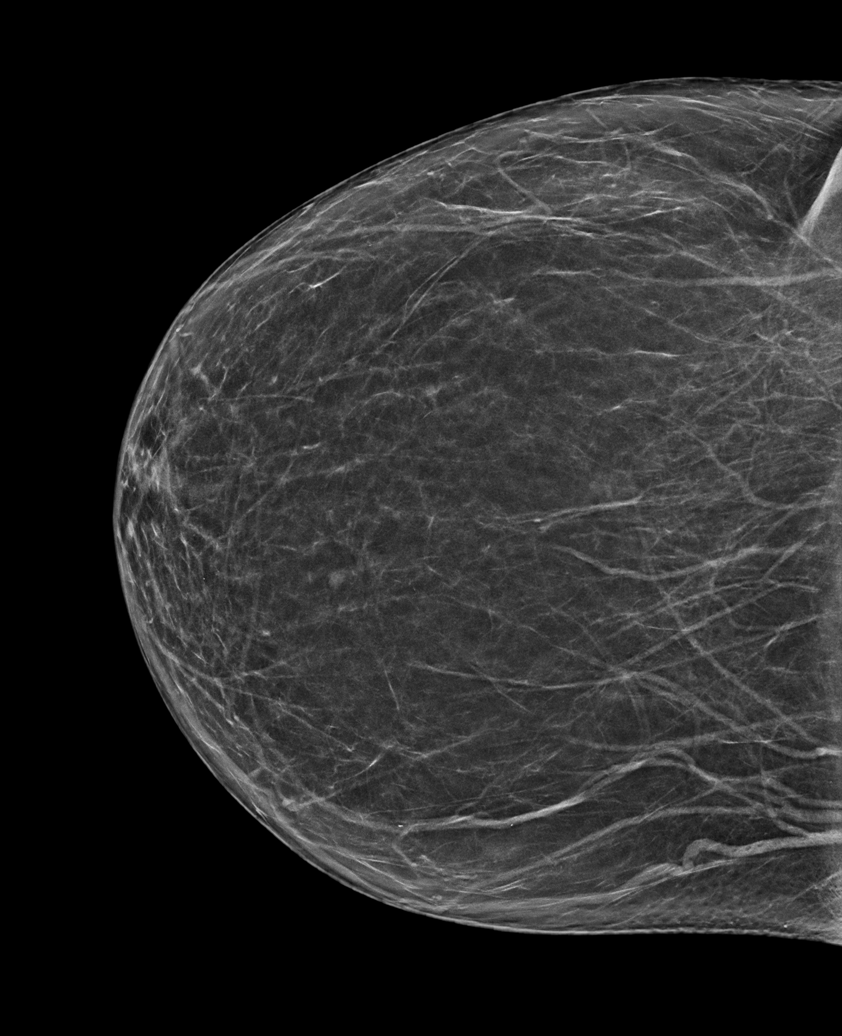

[R MLO synth-2D]
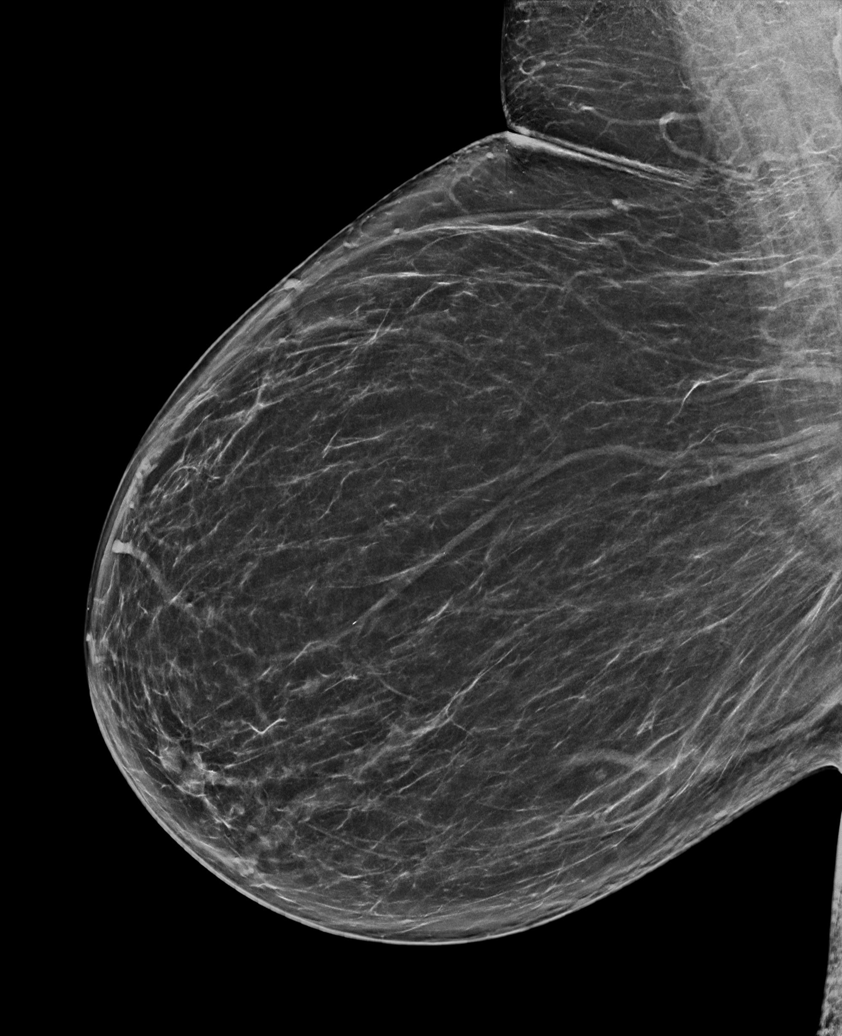

[L CC synth-2D (1 of 2)]
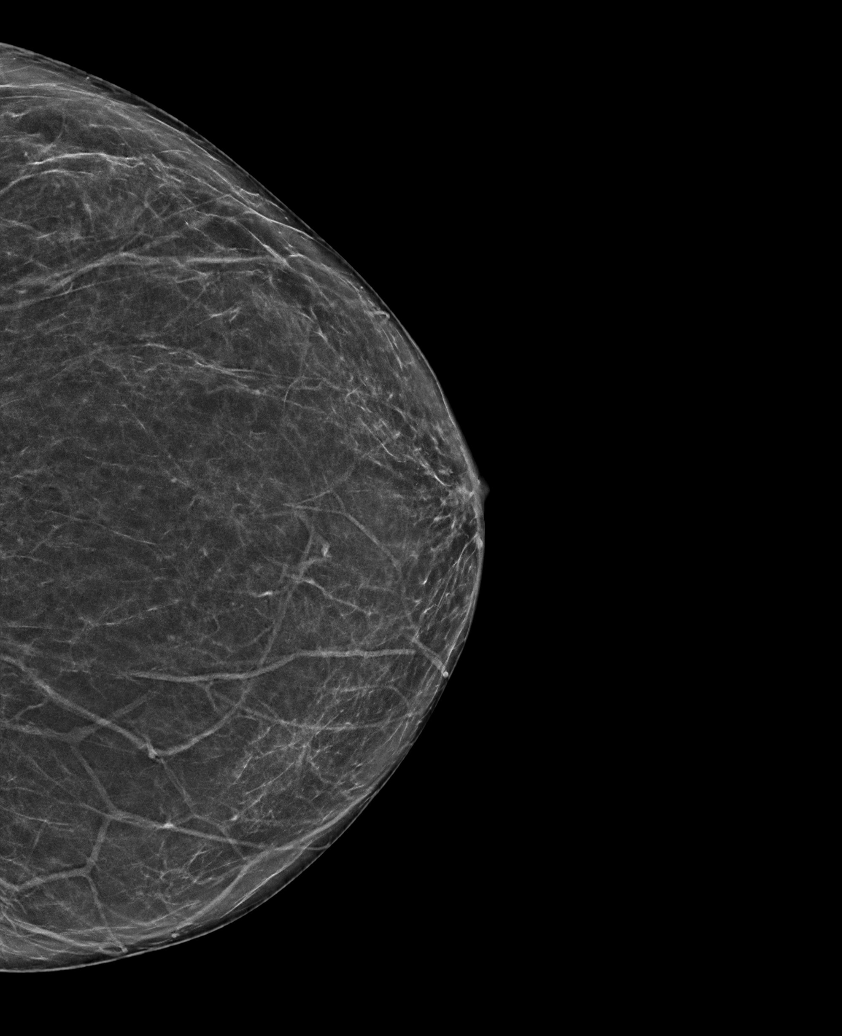

[R CC synth-2D (2 of 2)]
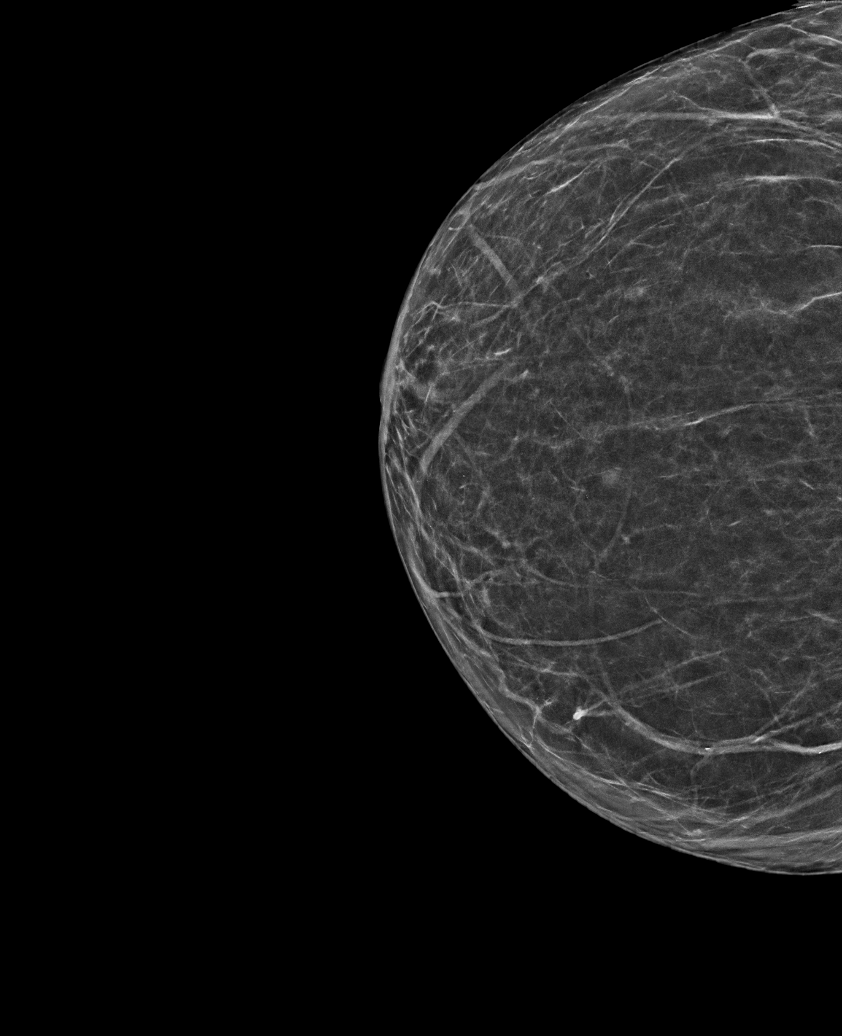

[L MLO synth-2D]
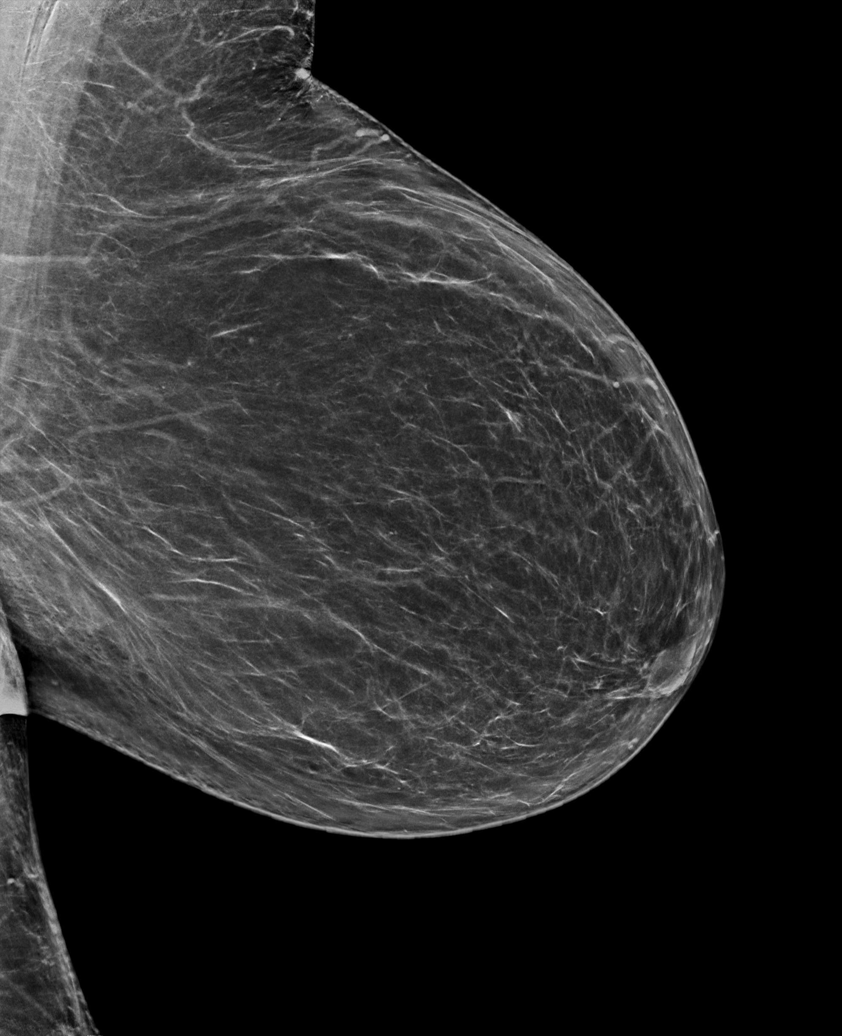

[L CC synth-2D (2 of 2)]
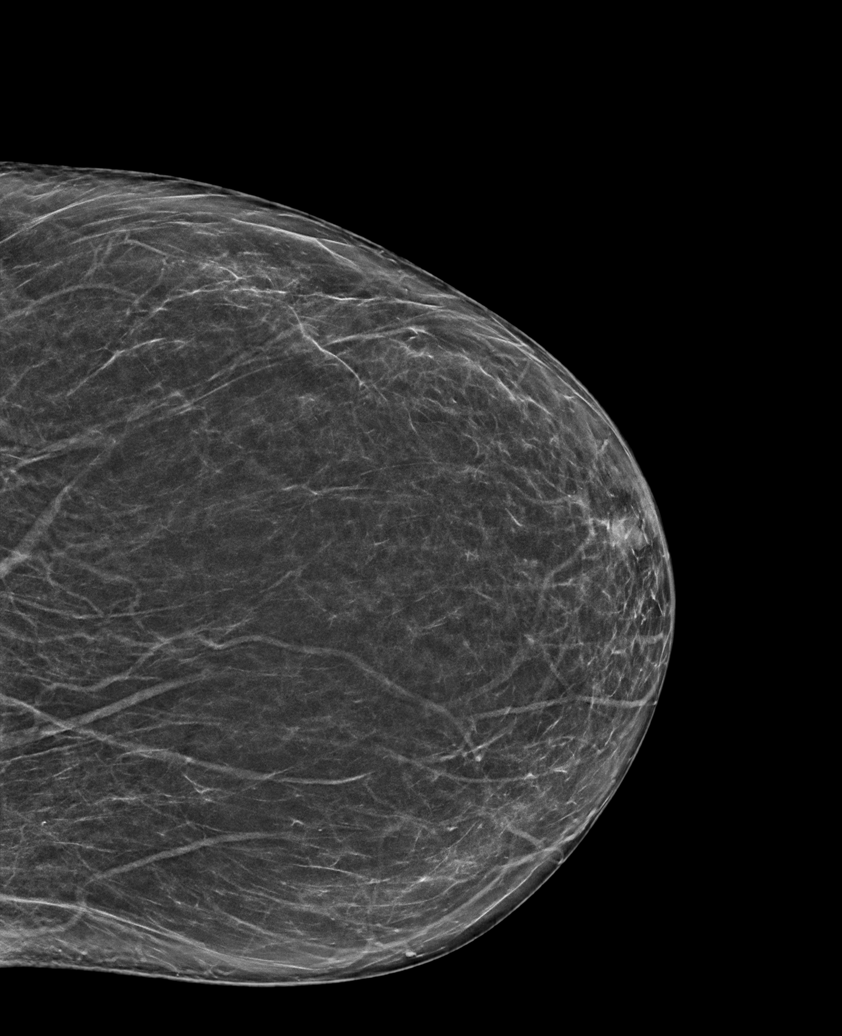

[6 of 36 positions shown; findings below may reference images not displayed]

ACR Breast Density Category b: There are scattered areas of
fibroglandular density.
FINDINGS: There are no findings suspicious for malignancy.
IMPRESSION: No mammographic evidence of malignancy. A result letter of this
screening mammogram will be mailed directly to the patient.

RECOMMENDATION:
Screening mammogram in one year. (Code:51-O-LD2)

BI-RADS CATEGORY  1: Negative.

## 2022-01-10 ENCOUNTER — Telehealth: Payer: Self-pay | Admitting: *Deleted

## 2022-01-10 NOTE — Patient Outreach (Signed)
  Care Coordination   01/10/2022 Name: Kimberly Donaldson MRN: [id number] DOB: 25-Jun-1955   Care Coordination Outreach Attempts:  An unsuccessful telephone outreach was attempted today to offer the patient information about available care coordination services as a benefit of their health plan.   Follow Up Plan:  Additional outreach attempts will be made to offer the patient care coordination information and services.   Encounter Outcome:  No Answer  Care Coordination Interventions Activated:  No   Care Coordination Interventions:  No, not indicated    Chong Sicilian, BSN, RN-BC RN Care Coordinator [place]: [phone] Main #: [phone]

## 2022-01-15 ENCOUNTER — Other Ambulatory Visit: Payer: Self-pay | Admitting: *Deleted

## 2022-01-15 NOTE — Patient Outreach (Signed)
  Care Coordination   01/15/2022  Name: Kimberly Donaldson MRN: [id number] DOB: 09/11/1955   Care Coordination Outreach Attempts:  A second unsuccessful outreach was attempted today to offer the patient with information about available care coordination services as a benefit of their health plan.   HIPAA compliant message left on voicemail, providing contact information for CSW, encouraging patient to return CSW's call at her earliest convenience.  Follow Up Plan:  Additional outreach attempts will be made to offer the patient care coordination information and services.   Encounter Outcome:  No Answer.   Care Coordination Interventions Activated:  No.    Care Coordination Interventions:  No, not indicated.    [name], BSW, MSW, LCSW  Licensed Education officer, environmental [facility]  Mailing [place] [address] [place], [place] [address] Physical Address-[address] [place], [place] [address] Toll Free Main # [phone] Fax # [phone] Cell # [phone] [email or web address]

## 2022-01-19 ENCOUNTER — Other Ambulatory Visit: Payer: Self-pay | Admitting: *Deleted

## 2022-01-19 NOTE — Patient Outreach (Signed)
  Care Coordination   01/19/2022  Name: Kimberly Donaldson MRN: [id number] DOB: 1955-07-23   Care Coordination Outreach Attempts:  A third unsuccessful outreach was attempted today to offer the patient with information about available care coordination services as a benefit of their health plan. HIPAA compliant message left on voicemail, providing contact information for CSW, encouraging patient to return CSW's call at her earliest convenience.  Follow Up Plan:  No further outreach attempts will be made at this time. We have been unable to contact the patient to offer or enroll patient in care coordination services.  Encounter Outcome:  No Answer.   Care Coordination Interventions Activated:  No.    Care Coordination Interventions:  No, not indicated.    [name], BSW, MSW, LCSW  Licensed Education officer, environmental [facility]  Mailing [place] [address], [place], [place] [address] Physical Address-[address] [place], [place] [address] Toll Free Main # [phone] Fax # [phone] Cell # [phone] [name]

## 2022-01-22 DIAGNOSIS — H43813 Vitreous degeneration, bilateral: Secondary | ICD-10-CM | POA: Diagnosis not present

## 2022-01-22 DIAGNOSIS — E113312 Type 2 diabetes mellitus with moderate nonproliferative diabetic retinopathy with macular edema, left eye: Secondary | ICD-10-CM | POA: Diagnosis not present

## 2022-01-22 DIAGNOSIS — E113411 Type 2 diabetes mellitus with severe nonproliferative diabetic retinopathy with macular edema, right eye: Secondary | ICD-10-CM | POA: Diagnosis not present

## 2022-03-20 ENCOUNTER — Encounter: Payer: Self-pay | Admitting: Family

## 2022-03-20 ENCOUNTER — Telehealth (INDEPENDENT_AMBULATORY_CARE_PROVIDER_SITE_OTHER): Payer: Medicare HMO | Admitting: Family

## 2022-03-20 DIAGNOSIS — R6889 Other general symptoms and signs: Secondary | ICD-10-CM

## 2022-03-20 MED ORDER — OSELTAMIVIR PHOSPHATE 75 MG PO CAPS: 75.0000 mg | ORAL_CAPSULE | Freq: Two times a day (BID) | ORAL | 0 refills | Status: DC

## 2022-03-20 NOTE — Progress Notes (Signed)
Virtual Visit Consent   Kimberly Donaldson, you are scheduled for a virtual visit with a [facility] provider today. Just as with appointments in the office, your consent must be obtained to participate. Your consent will be active for this visit and any virtual visit you may have with one of our providers in the next 365 days. If you have a MyChart account, a copy of this consent can be sent to you electronically.  As this is a virtual visit, video technology does not allow for your provider to perform a traditional examination. This may limit your provider's ability to fully assess your condition. If your provider identifies any concerns that need to be evaluated in person or the need to arrange testing (such as labs, EKG, etc.), we will make arrangements to do so. Although advances in technology are sophisticated, we cannot ensure that it will always work on either your end or our end. If the connection with a video visit is poor, the visit may have to be switched to a telephone visit. With either a video or telephone visit, we are not always able to ensure that we have a secure connection.  By engaging in this virtual visit, you consent to the provision of healthcare and authorize for your insurance to be billed (if applicable) for the services provided during this visit. Depending on your insurance coverage, you may receive a charge related to this service.  I need to obtain your verbal consent now. Are you willing to proceed with your visit today? Kimberly Donaldson has provided verbal consent on 03/20/2022 for a virtual visit (video or telephone). Evelina Dun, FNP  Date: 03/20/2022 9:00 AM  Virtual Visit via Video Note   I, Evelina Dun, connected with  Kimberly Donaldson  ([id number], 12/10/1955) on 03/20/22 at  8:25 AM EST by a video-enabled telemedicine application and verified that I am speaking with the correct person using two identifiers.  Location: Patient: Virtual Visit Location Patient:  Home Provider: Virtual Visit Location Provider: Home Office   I discussed the limitations of evaluation and management by telemedicine and the availability of in person appointments. The patient expressed understanding and agreed to proceed.    History of Present Illness: Kimberly Donaldson is a 67 y.o. who identifies as a female who was assigned female at birth, and is being seen today for flu like symptoms that started yesterday. Did a home COVID test that was negative.   HPI: Influenza This is a new problem. The current episode started yesterday. The problem occurs constantly. The problem has been gradually worsening. Associated symptoms include chills, coughing, fatigue, a fever, headaches, joint swelling, myalgias and a sore throat. Pertinent negatives include no congestion, nausea, visual change or vomiting. She has tried acetaminophen for the symptoms. The treatment provided mild relief.    Problems:  Patient Active Problem List   Diagnosis Date Noted   PAD (peripheral artery disease) ([place]) 07/18/2021   Type 2 diabetes mellitus with hypertriglyceridemia ([place]) 07/11/2020   Stress incontinence 06/23/2015   HTN (hypertension) 05/20/2015   Back pain with sciatica 02/03/2015   DM2 (diabetes mellitus, type 2) ([place]) 02/03/2015    Allergies:  Allergies  Allergen Reactions   Codeine     Ringing in ears   Morphine Hives   Other Hives    Sporanox - for nail fungus   Medications:  Current Outpatient Medications:    oseltamivir (TAMIFLU) 75 MG capsule, Take 1 capsule (75 mg total) by mouth 2 (two)  times daily., Disp: 10 capsule, Rfl: 0   alendronate (FOSAMAX) 70 MG tablet, Take 1 tablet (70 mg total) by mouth every 7 (seven) days. Take with a full glass of water on an empty stomach., Disp: 4 tablet, Rfl: 11   amLODipine (NORVASC) 5 MG tablet, Take 1 tablet (5 mg total) by mouth daily., Disp: 90 tablet, Rfl: 3   aspirin EC 81 MG tablet, Take 81 mg by mouth daily. Swallow whole., Disp: , Rfl:     buPROPion (WELLBUTRIN XL) 150 MG 24 hr tablet, TAKE ONE TABLET ONCE DAILY, Disp: 30 tablet, Rfl: 5   collagenase (SANTYL) 250 UNIT/GM ointment, Apply 1 Application topically daily., Disp: 30 g, Rfl: 0   ibuprofen (ADVIL,MOTRIN) 200 MG tablet, Take 800 mg by mouth every 6 (six) hours as needed for moderate pain., Disp: , Rfl:    lisinopril (ZESTRIL) 20 MG tablet, Take 2 tablets (40 mg total) by mouth daily., Disp: 180 tablet, Rfl: 3   loperamide (IMODIUM) 2 MG capsule, Take 2 mg by mouth as needed for diarrhea or loose stools., Disp: , Rfl:    Multiple Vitamins-Minerals (AIRBORNE PO), Take 1 tablet by mouth daily., Disp: , Rfl:    Omega-3 Fatty Acids (FISH OIL) 1000 MG CAPS, Take 1,000 mg by mouth daily., Disp: , Rfl:    rosuvastatin (CRESTOR) 5 MG tablet, Take 1 tablet (5 mg total) by mouth daily., Disp: 90 tablet, Rfl: 3   Semaglutide,0.25 or 0.'5MG'$ /DOS, (OZEMPIC, 0.25 OR 0.5 MG/DOSE,) 2 MG/1.5ML SOPN, Inject 0.5 mg into the skin once a week., Disp: 4.5 mL, Rfl: 3   tobramycin (TOBREX) 0.3 % ophthalmic solution, Place 1 drop into both eyes See admin instructions. Used before eye injections every 3 months, Disp: , Rfl:   Observations/Objective: Patient is well-developed, well-nourished in no acute distress.  Resting comfortably  at home.  Head is normocephalic, atraumatic.  No labored breathing.  Speech is clear and coherent with logical content.  Patient is alert and oriented at baseline.    Assessment and Plan: 1. Flu-like symptoms - oseltamivir (TAMIFLU) 75 MG capsule; Take 1 capsule (75 mg total) by mouth 2 (two) times daily.  Dispense: 10 capsule; Refill: 0  Rest Force fluids Droplet precautions  Tylenol and Motrin  Follow up if symptoms worsen or do not improve   Follow Up Instructions: I discussed the assessment and treatment plan with the patient. The patient was provided an opportunity to ask questions and all were answered. The patient agreed with the plan and demonstrated  an understanding of the instructions.  A copy of instructions were sent to the patient via MyChart unless otherwise noted below.     The patient was advised to call back or seek an in-person evaluation if the symptoms worsen or if the condition fails to improve as anticipated.  Time:  I spent 11 minutes with the patient via telehealth technology discussing the above problems/concerns.    Evelina Dun, FNP

## 2022-04-04 ENCOUNTER — Telehealth: Payer: Self-pay

## 2022-04-04 NOTE — Telephone Encounter (Signed)
Pharmacy Patient Advocate Encounter   Received notification from [facility] that prior authorization for Ozempic '2mg'$ /41m is required/requested.   [id number] Status is Ozempic is available without authorization.

## 2022-04-10 DIAGNOSIS — E113411 Type 2 diabetes mellitus with severe nonproliferative diabetic retinopathy with macular edema, right eye: Secondary | ICD-10-CM | POA: Diagnosis not present

## 2022-04-10 DIAGNOSIS — E113312 Type 2 diabetes mellitus with moderate nonproliferative diabetic retinopathy with macular edema, left eye: Secondary | ICD-10-CM | POA: Diagnosis not present

## 2022-04-10 DIAGNOSIS — H35033 Hypertensive retinopathy, bilateral: Secondary | ICD-10-CM | POA: Diagnosis not present

## 2022-04-10 DIAGNOSIS — H43813 Vitreous degeneration, bilateral: Secondary | ICD-10-CM | POA: Diagnosis not present

## 2022-05-14 ENCOUNTER — Ambulatory Visit (INDEPENDENT_AMBULATORY_CARE_PROVIDER_SITE_OTHER): Payer: Medicare HMO | Admitting: Family Medicine

## 2022-05-14 ENCOUNTER — Encounter: Payer: Self-pay | Admitting: Family Medicine

## 2022-05-14 VITALS — BP 136/74 | HR 60 | Ht 64.0 in | Wt 195.0 lb

## 2022-05-14 DIAGNOSIS — B372 Candidiasis of skin and nail: Secondary | ICD-10-CM

## 2022-05-14 DIAGNOSIS — L02214 Cutaneous abscess of groin: Secondary | ICD-10-CM | POA: Diagnosis not present

## 2022-05-14 MED ORDER — SULFAMETHOXAZOLE-TRIMETHOPRIM 800-160 MG PO TABS: 1.0000 | ORAL_TABLET | Freq: Two times a day (BID) | ORAL | 0 refills | Status: DC

## 2022-05-14 MED ORDER — NYSTATIN-TRIAMCINOLONE 100000-0.1 UNIT/GM-% EX OINT: 1.0000 | TOPICAL_OINTMENT | Freq: Two times a day (BID) | CUTANEOUS | 0 refills | Status: DC

## 2022-05-14 NOTE — Progress Notes (Signed)
BP 136/74   Pulse 60   Ht '5\' 4"'$  (1.626 m)   Wt 195 lb (88.5 kg)   SpO2 100%   BMI 33.47 kg/m    Subjective:   Patient ID: Kimberly Donaldson, female    DOB: [birth year], 67 y.o.   MRN: [id number]  HPI: Kimberly Donaldson is a 67 y.o. female presenting on 05/14/2022 for 2 cm abscess in left suprapubic region. Patient states this has been present since Wednesday of last week and has been expelling pus since Friday. Patient states it has gotten a little better since Friday but could not get an appointment to come in until today. Patient has tried warm compresses and hot showers without relief. Patient denies fevers, chills. Area is tender to palpation, erythematous, but not warm to the touch. Patient also has chronic dermatitis present in bilateral groin region that she has been taking topical corticosteroids for without improvement. Denies pruritus.  She has followed these issues before.  She does need antibiotics before to help with this.  She gets psoriasis in her gluteal folds and lower abdomen which leads to skin openings or infections.   Relevant past medical, surgical, family and social history reviewed and updated as indicated. Interim medical history since our last visit reviewed. Allergies and medications reviewed and updated.  Review of Systems  Constitutional:  Negative for chills and fever.  Respiratory:  Negative for shortness of breath.   Cardiovascular:  Negative for chest pain.  Gastrointestinal:  Negative for abdominal pain.  Genitourinary:  Negative for difficulty urinating and dysuria.  Skin:  Positive for color change, rash and wound. Negative for pallor.  All other systems reviewed and are negative.   Per HPI unless specifically indicated above   Allergies as of 05/14/2022       Reactions   Codeine    Ringing in ears   Morphine Hives   Other Hives   Sporanox - for nail fungus        Medication List        Accurate as of May 14, 2022 11:59 PM. If you  have any questions, ask your nurse or doctor.          STOP taking these medications    alendronate 70 MG tablet Commonly known as: FOSAMAX Stopped by: Fransisca Kaufmann Aldea Avis, MD   oseltamivir 75 MG capsule Commonly known as: Tamiflu Stopped by: Fransisca Kaufmann Zyrion Coey, MD       TAKE these medications    AIRBORNE PO Take 1 tablet by mouth daily.   amLODipine 5 MG tablet Commonly known as: NORVASC Take 1 tablet (5 mg total) by mouth daily.   aspirin EC 81 MG tablet Take 81 mg by mouth daily. Swallow whole.   buPROPion 150 MG 24 hr tablet Commonly known as: WELLBUTRIN XL TAKE ONE TABLET ONCE DAILY   Fish Oil 1000 MG Caps Take 1,000 mg by mouth daily.   ibuprofen 200 MG tablet Commonly known as: ADVIL Take 800 mg by mouth every 6 (six) hours as needed for moderate pain.   lisinopril 20 MG tablet Commonly known as: ZESTRIL Take 2 tablets (40 mg total) by mouth daily.   loperamide 2 MG capsule Commonly known as: IMODIUM Take 2 mg by mouth as needed for diarrhea or loose stools.   nystatin-triamcinolone ointment Commonly known as: MYCOLOG Apply 1 Application topically 2 (two) times daily. Started by: Fransisca Kaufmann Karen Huhta, MD   Ozempic (0.25 or 0.5 MG/DOSE) 2 MG/1.5ML Sopn Generic drug:  Semaglutide(0.25 or 0.'5MG'$ /DOS) Inject 0.5 mg into the skin once a week.   rosuvastatin 5 MG tablet Commonly known as: Crestor Take 1 tablet (5 mg total) by mouth daily.   Santyl 250 UNIT/GM ointment Generic drug: collagenase Apply 1 Application topically daily.   sulfamethoxazole-trimethoprim 800-160 MG tablet Commonly known as: BACTRIM DS Take 1 tablet by mouth 2 (two) times daily. Started by: Fransisca Kaufmann Shaelynn Dragos, MD   tobramycin 0.3 % ophthalmic solution Commonly known as: TOBREX Place 1 drop into both eyes See admin instructions. Used before eye injections every 3 months         Objective:   BP 136/74   Pulse 60   Ht '5\' 4"'$  (1.626 m)   Wt 195 lb (88.5 kg)   SpO2  100%   BMI 33.47 kg/m   Wt Readings from Last 3 Encounters:  05/14/22 195 lb (88.5 kg)  12/21/21 191 lb (86.6 kg)  12/04/21 191 lb (86.6 kg)    Physical Exam Constitutional:      Appearance: Normal appearance.  HENT:     Nose: Nose normal.  Eyes:     Conjunctiva/sclera: Conjunctivae normal.  Cardiovascular:     Rate and Rhythm: Normal rate.     Pulses: Normal pulses.     Heart sounds: Normal heart sounds.  Pulmonary:     Effort: Pulmonary effort is normal.     Breath sounds: Normal breath sounds.  Abdominal:     Palpations: Abdomen is soft. There is no mass.     Tenderness: There is no abdominal tenderness.     Hernia: No hernia is present.  Skin:    Coloration: Skin is not jaundiced or pale.     Findings: Abscess, erythema and rash present. No ecchymosis. Rash is not scaling or vesicular.     Comments: Left groin/pubic abscess present roughly 2 cm in size. No pustular drainage. Erythematous rash present bilaterally in groin region.  Neurological:     Mental Status: She is alert.  Psychiatric:        Mood and Affect: Mood normal.        Behavior: Behavior normal.    Assessment & Plan:   Problem List Items Addressed This Visit   None Visit Diagnoses     Yeast dermatitis    -  Primary   Relevant Medications   nystatin-triamcinolone ointment (MYCOLOG)   sulfamethoxazole-trimethoprim (BACTRIM DS) 800-160 MG tablet   Abscess of left groin       Relevant Medications   sulfamethoxazole-trimethoprim (BACTRIM DS) 800-160 MG tablet       Patient instructed to take Bactrim PO BID for abscess and apply nystatin-triamcinolone ointment BID for yeast dermatitis.   Follow up plan: Return if symptoms worsen or fail to improve.  Counseling provided for all of the vaccine components No orders of the defined types were placed in this encounter.  Hulan Amato, PA-S2 05/14/2022, 9:30 AM  Patient seen and examined with Hulan Amato.  Agree with assessment and plan  above. Caryl Pina, MD [facility] 05/20/2022, 10:44 PM

## 2022-06-04 ENCOUNTER — Other Ambulatory Visit: Payer: Self-pay | Admitting: Family Medicine

## 2022-06-04 MED ORDER — OZEMPIC (0.25 OR 0.5 MG/DOSE) 2 MG/1.5ML ~~LOC~~ SOPN: 0.5000 mg | PEN_INJECTOR | SUBCUTANEOUS | 0 refills | Status: DC

## 2022-06-04 NOTE — Addendum Note (Signed)
Addended by: Brynda Peon F on: 06/04/2022 11:18 AM   Modules accepted: Orders

## 2022-06-04 NOTE — Telephone Encounter (Signed)
Patient has appt 4-24 with [facility]

## 2022-07-03 DIAGNOSIS — H35033 Hypertensive retinopathy, bilateral: Secondary | ICD-10-CM | POA: Diagnosis not present

## 2022-07-03 DIAGNOSIS — H43813 Vitreous degeneration, bilateral: Secondary | ICD-10-CM | POA: Diagnosis not present

## 2022-07-03 DIAGNOSIS — E113312 Type 2 diabetes mellitus with moderate nonproliferative diabetic retinopathy with macular edema, left eye: Secondary | ICD-10-CM | POA: Diagnosis not present

## 2022-07-03 DIAGNOSIS — E113411 Type 2 diabetes mellitus with severe nonproliferative diabetic retinopathy with macular edema, right eye: Secondary | ICD-10-CM | POA: Diagnosis not present

## 2022-07-13 ENCOUNTER — Encounter: Payer: Self-pay | Admitting: Family Medicine

## 2022-07-13 ENCOUNTER — Ambulatory Visit (INDEPENDENT_AMBULATORY_CARE_PROVIDER_SITE_OTHER): Payer: Medicare HMO | Admitting: Family Medicine

## 2022-07-13 VITALS — BP 129/65 | HR 57 | Temp 98.6°F | Ht 64.0 in | Wt 196.0 lb

## 2022-07-13 DIAGNOSIS — Z0000 Encounter for general adult medical examination without abnormal findings: Secondary | ICD-10-CM | POA: Diagnosis not present

## 2022-07-13 DIAGNOSIS — Z6833 Body mass index (BMI) 33.0-33.9, adult: Secondary | ICD-10-CM

## 2022-07-13 DIAGNOSIS — Z136 Encounter for screening for cardiovascular disorders: Secondary | ICD-10-CM | POA: Diagnosis not present

## 2022-07-13 DIAGNOSIS — G8929 Other chronic pain: Secondary | ICD-10-CM | POA: Diagnosis not present

## 2022-07-13 DIAGNOSIS — Z0001 Encounter for general adult medical examination with abnormal findings: Secondary | ICD-10-CM | POA: Diagnosis not present

## 2022-07-13 DIAGNOSIS — I10 Essential (primary) hypertension: Secondary | ICD-10-CM

## 2022-07-13 DIAGNOSIS — Z7985 Long-term (current) use of injectable non-insulin antidiabetic drugs: Secondary | ICD-10-CM

## 2022-07-13 DIAGNOSIS — Z716 Tobacco abuse counseling: Secondary | ICD-10-CM

## 2022-07-13 DIAGNOSIS — E669 Obesity, unspecified: Secondary | ICD-10-CM | POA: Diagnosis not present

## 2022-07-13 DIAGNOSIS — M546 Pain in thoracic spine: Secondary | ICD-10-CM | POA: Diagnosis not present

## 2022-07-13 DIAGNOSIS — E1159 Type 2 diabetes mellitus with other circulatory complications: Secondary | ICD-10-CM

## 2022-07-13 DIAGNOSIS — E781 Pure hyperglyceridemia: Secondary | ICD-10-CM | POA: Diagnosis not present

## 2022-07-13 DIAGNOSIS — B372 Candidiasis of skin and nail: Secondary | ICD-10-CM

## 2022-07-13 DIAGNOSIS — E1169 Type 2 diabetes mellitus with other specified complication: Secondary | ICD-10-CM | POA: Diagnosis not present

## 2022-07-13 DIAGNOSIS — I152 Hypertension secondary to endocrine disorders: Secondary | ICD-10-CM

## 2022-07-13 LAB — BAYER DCA HB A1C WAIVED: HB A1C (BAYER DCA - WAIVED): 6.1 % — ABNORMAL HIGH (ref 4.8–5.6)

## 2022-07-13 MED ORDER — ROSUVASTATIN CALCIUM 5 MG PO TABS: 5.0000 mg | ORAL_TABLET | Freq: Every day | ORAL | 3 refills | Status: DC

## 2022-07-13 MED ORDER — BUPROPION HCL ER (XL) 150 MG PO TB24: 150.0000 mg | ORAL_TABLET | Freq: Every day | ORAL | 3 refills | Status: DC

## 2022-07-13 MED ORDER — AMLODIPINE BESYLATE 5 MG PO TABS: 5.0000 mg | ORAL_TABLET | Freq: Every day | ORAL | 3 refills | Status: DC

## 2022-07-13 MED ORDER — LISINOPRIL 20 MG PO TABS: 40.0000 mg | ORAL_TABLET | Freq: Every day | ORAL | 3 refills | Status: DC

## 2022-07-13 MED ORDER — NYSTATIN-TRIAMCINOLONE 100000-0.1 UNIT/GM-% EX OINT
1.0000 | TOPICAL_OINTMENT | Freq: Two times a day (BID) | CUTANEOUS | 3 refills | Status: AC
Start: 2022-07-13 — End: ?

## 2022-07-13 MED ORDER — OZEMPIC (0.25 OR 0.5 MG/DOSE) 2 MG/1.5ML ~~LOC~~ SOPN: 0.5000 mg | PEN_INJECTOR | SUBCUTANEOUS | 3 refills | Status: DC

## 2022-07-13 NOTE — Progress Notes (Signed)
BP 129/65   Pulse (!) 57   Temp 98.6 F (37 C)   Ht 5\' 4"  (1.626 m)   Wt 196 lb (88.9 kg)   SpO2 98%   BMI 33.64 kg/m    Subjective:   Patient ID: Kimberly Donaldson, female    DOB: 04-May-1955, 67 y.o.   MRN: [id number]  HPI: Kimberly Donaldson is a 67 y.o. female presenting on 07/13/2022 for Annual Exam, Back Pain (Ongoing ), and Arthritis   HPI Exam, physical Patient denies any chest pain, shortness of breath, headaches or vision issues, abdominal complaints, diarrhea, nausea, vomiting, or joint issues.   Type 2 diabetes mellitus Patient comes in today for recheck of his diabetes. Patient has been currently taking Ozempic. Patient is currently on an ACE inhibitor/ARB. Patient has not seen an ophthalmologist this year. Patient denies any new issues with their feet. The symptom started onset as an adult hypertension and hypertriglyceridemia ARE RELATED TO DM   Hypertension Patient is currently on amlodipine and lisinopril, and their blood pressure today is 129/65. Patient denies any lightheadedness or dizziness. Patient denies headaches, blurred vision, chest pains, shortness of breath, or weakness. Denies any side effects from medication and is content with current medication.   Hypertriglyceridemia  Patient is coming in for recheck of his hyperlipidemia. The patient is currently taking fish oils and Crestor. They deny any issues with myalgias or history of liver damage from it. They deny any focal numbness or weakness or chest pain.   Relevant past medical, surgical, family and social history reviewed and updated as indicated. Interim medical history since our last visit reviewed. Allergies and medications reviewed and updated.  Review of Systems  Constitutional:  Negative for chills and fever.  HENT:  Negative for congestion, ear discharge and ear pain.   Eyes:  Negative for redness and visual disturbance.  Respiratory:  Negative for chest tightness and shortness of breath.    Cardiovascular:  Negative for chest pain and leg swelling.  Genitourinary:  Negative for difficulty urinating and dysuria.  Musculoskeletal:  Positive for arthralgias and back pain. Negative for gait problem.  Skin:  Negative for rash.  Neurological:  Negative for light-headedness and headaches.  Psychiatric/Behavioral:  Negative for agitation and behavioral problems.   All other systems reviewed and are negative.   Per HPI unless specifically indicated above   Allergies as of 07/13/2022       Reactions   Codeine    Ringing in ears   Morphine Hives   Other Hives   Sporanox - for nail fungus        Medication List        Accurate as of July 13, 2022 11:20 AM. If you have any questions, ask your nurse or doctor.          STOP taking these medications    sulfamethoxazole-trimethoprim 800-160 MG tablet Commonly known as: BACTRIM DS Stopped by: Elige Radon Lucero Auzenne, MD       TAKE these medications    AIRBORNE PO Take 1 tablet by mouth daily.   amLODipine 5 MG tablet Commonly known as: NORVASC Take 1 tablet (5 mg total) by mouth daily.   aspirin EC 81 MG tablet Take 81 mg by mouth daily. Swallow whole.   buPROPion 150 MG 24 hr tablet Commonly known as: WELLBUTRIN XL Take 1 tablet (150 mg total) by mouth daily.   Fish Oil 1000 MG Caps Take 1,000 mg by mouth daily.   ibuprofen 200  MG tablet Commonly known as: ADVIL Take 800 mg by mouth every 6 (six) hours as needed for moderate pain.   lisinopril 20 MG tablet Commonly known as: ZESTRIL Take 2 tablets (40 mg total) by mouth daily.   loperamide 2 MG capsule Commonly known as: IMODIUM Take 2 mg by mouth as needed for diarrhea or loose stools.   nystatin-triamcinolone ointment Commonly known as: MYCOLOG Apply 1 Application topically 2 (two) times daily.   Ozempic (0.25 or 0.5 MG/DOSE) 2 MG/1.5ML Sopn Generic drug: Semaglutide(0.25 or 0.5MG /DOS) Inject 0.5 mg into the skin once a week.    rosuvastatin 5 MG tablet Commonly known as: Crestor Take 1 tablet (5 mg total) by mouth daily.   Santyl 250 UNIT/GM ointment Generic drug: collagenase Apply 1 Application topically daily.   tobramycin 0.3 % ophthalmic solution Commonly known as: TOBREX Place 1 drop into both eyes See admin instructions. Used before eye injections every 3 months         Objective:   BP 129/65   Pulse (!) 57   Temp 98.6 F (37 C)   Ht 5\' 4"  (1.626 m)   Wt 196 lb (88.9 kg)   SpO2 98%   BMI 33.64 kg/m   Wt Readings from Last 3 Encounters:  07/13/22 196 lb (88.9 kg)  05/14/22 195 lb (88.5 kg)  12/21/21 191 lb (86.6 kg)    Physical Exam Vitals and nursing note reviewed.  Constitutional:      General: She is not in acute distress.    Appearance: She is well-developed. She is not diaphoretic.  Eyes:     Conjunctiva/sclera: Conjunctivae normal.  Cardiovascular:     Rate and Rhythm: Normal rate and regular rhythm.     Heart sounds: Normal heart sounds. No murmur heard. Pulmonary:     Effort: Pulmonary effort is normal. No respiratory distress.     Breath sounds: Normal breath sounds. No wheezing.  Musculoskeletal:        General: No swelling or tenderness (No tenderness on exam). Normal range of motion.  Skin:    General: Skin is warm and dry.     Findings: No rash.  Neurological:     Mental Status: She is alert and oriented to person, place, and time.     Coordination: Coordination normal.  Psychiatric:        Behavior: Behavior normal.       Assessment & Plan:   Problem List Items Addressed This Visit       Cardiovascular and Mediastinum   HTN (hypertension)   Relevant Medications   amLODipine (NORVASC) 5 MG tablet   lisinopril (ZESTRIL) 20 MG tablet   rosuvastatin (CRESTOR) 5 MG tablet     Endocrine   DM2 (diabetes mellitus, type 2) (HCC)   Relevant Medications   lisinopril (ZESTRIL) 20 MG tablet   rosuvastatin (CRESTOR) 5 MG tablet   Semaglutide,0.25 or  0.5MG /DOS, (OZEMPIC, 0.25 OR 0.5 MG/DOSE,) 2 MG/1.5ML SOPN   Other Relevant Orders   Bayer DCA Hb A1c Waived   Microalbumin / creatinine urine ratio   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)   Relevant Medications   amLODipine (NORVASC) 5 MG tablet   lisinopril (ZESTRIL) 20 MG tablet   rosuvastatin (CRESTOR) 5 MG tablet   Semaglutide,0.25 or 0.5MG /DOS, (OZEMPIC, 0.25 OR 0.5 MG/DOSE,) 2 MG/1.5ML SOPN   Other Relevant Orders   Bayer DCA Hb A1c Waived   Microalbumin / creatinine urine ratio   Other Visit Diagnoses  Physical exam, annual    -  Primary   Relevant Orders   CBC with Differential/Platelet   Comprehensive metabolic panel   Lipid panel   TSH   Obesity (BMI 30.0-34.9)       Encounter for smoking cessation counseling       Relevant Medications   buPROPion (WELLBUTRIN XL) 150 MG 24 hr tablet   Yeast dermatitis       Relevant Medications   nystatin-triamcinolone ointment (MYCOLOG)   Chronic right-sided thoracic back pain       Relevant Medications   buPROPion (WELLBUTRIN XL) 150 MG 24 hr tablet      Recommend continue the heat and use Voltaren gel for her back and arthritis.  She can also continue to use ibuprofen twice a day..  A1c is 6.1, blood pressure looks good.  No changes for medication, seems to be doing well. In smoking cessation.  Follow up plan: Return in about 3 months (around 10/12/2022), or if symptoms worsen or fail to improve, for Diabetes and hypertension.  Counseling provided for all of the vaccine components Orders Placed This Encounter  Procedures   Bayer DCA Hb A1c Waived   Microalbumin / creatinine urine ratio   CBC with Differential/Platelet   Comprehensive metabolic panel   Lipid panel   TSH    Arville Care, MD [facility] 07/13/2022, 11:20 AM

## 2022-07-14 LAB — COMPREHENSIVE METABOLIC PANEL
ALT: 20 IU/L (ref 0–32)
AST: 21 IU/L (ref 0–40)
Albumin/Globulin Ratio: 1.8 (ref 1.2–2.2)
Albumin: 4 g/dL (ref 3.9–4.9)
Alkaline Phosphatase: 97 IU/L (ref 44–121)
BUN/Creatinine Ratio: 19 (ref 12–28)
BUN: 20 mg/dL (ref 8–27)
Bilirubin Total: 0.3 mg/dL (ref 0.0–1.2)
CO2: 22 mmol/L (ref 20–29)
Calcium: 9.2 mg/dL (ref 8.7–10.3)
Chloride: 106 mmol/L (ref 96–106)
Creatinine, Ser: 1.05 mg/dL — ABNORMAL HIGH (ref 0.57–1.00)
Globulin, Total: 2.2 g/dL (ref 1.5–4.5)
Glucose: 87 mg/dL (ref 70–99)
Potassium: 4 mmol/L (ref 3.5–5.2)
Sodium: 143 mmol/L (ref 134–144)
Total Protein: 6.2 g/dL (ref 6.0–8.5)
eGFR: 58 mL/min/{1.73_m2} — ABNORMAL LOW (ref >59)

## 2022-07-14 LAB — CBC WITH DIFFERENTIAL/PLATELET
Basophils Absolute: 0.1 10*3/uL (ref 0.0–0.2)
Basos: 1 %
EOS (ABSOLUTE): 0.2 10*3/uL (ref 0.0–0.4)
Eos: 3 %
Hematocrit: 41.1 % (ref 34.0–46.6)
Hemoglobin: 13.8 g/dL (ref 11.1–15.9)
Immature Grans (Abs): 0.1 10*3/uL (ref 0.0–0.1)
Immature Granulocytes: 1 %
Lymphocytes Absolute: 2.9 10*3/uL (ref 0.7–3.1)
Lymphs: 38 %
MCH: 29.2 pg (ref 26.6–33.0)
MCHC: 33.6 g/dL (ref 31.5–35.7)
MCV: 87 fL (ref 79–97)
Monocytes Absolute: 0.7 10*3/uL (ref 0.1–0.9)
Monocytes: 10 %
Neutrophils Absolute: 3.7 10*3/uL (ref 1.4–7.0)
Neutrophils: 47 %
Platelets: 231 10*3/uL (ref 150–450)
RBC: 4.72 x10E6/uL (ref 3.77–5.28)
RDW: 13.5 % (ref 11.7–15.4)
WBC: 7.7 10*3/uL (ref 3.4–10.8)

## 2022-07-14 LAB — LIPID PANEL
Chol/HDL Ratio: 1.9 ratio (ref 0.0–4.4)
Cholesterol, Total: 101 mg/dL (ref 100–199)
HDL: 53 mg/dL (ref >39)
LDL Chol Calc (NIH): 28 mg/dL (ref 0–99)
Triglycerides: 111 mg/dL (ref 0–149)
VLDL Cholesterol Cal: 20 mg/dL (ref 5–40)

## 2022-07-14 LAB — TSH: TSH: 2.3 u[IU]/mL (ref 0.450–4.500)

## 2022-08-08 ENCOUNTER — Telehealth: Payer: Self-pay | Admitting: Family Medicine

## 2022-08-08 DIAGNOSIS — R300 Dysuria: Secondary | ICD-10-CM

## 2022-08-08 NOTE — Telephone Encounter (Signed)
Orders placed for urinalysis and urine culture.

## 2022-08-09 ENCOUNTER — Encounter: Payer: Self-pay | Admitting: Family Medicine

## 2022-08-09 ENCOUNTER — Telehealth (INDEPENDENT_AMBULATORY_CARE_PROVIDER_SITE_OTHER): Payer: Medicare HMO | Admitting: Family Medicine

## 2022-08-09 ENCOUNTER — Other Ambulatory Visit: Payer: Self-pay

## 2022-08-09 DIAGNOSIS — R300 Dysuria: Secondary | ICD-10-CM | POA: Diagnosis not present

## 2022-08-09 DIAGNOSIS — R8290 Unspecified abnormal findings in urine: Secondary | ICD-10-CM | POA: Diagnosis not present

## 2022-08-09 DIAGNOSIS — R350 Frequency of micturition: Secondary | ICD-10-CM | POA: Diagnosis not present

## 2022-08-09 LAB — MICROSCOPIC EXAMINATION
RBC, Urine: NONE SEEN /hpf (ref 0–2)
Renal Epithel, UA: NONE SEEN /hpf

## 2022-08-09 LAB — URINALYSIS, ROUTINE W REFLEX MICROSCOPIC
Bilirubin, UA: NEGATIVE
Glucose, UA: NEGATIVE
Leukocytes,UA: NEGATIVE
Nitrite, UA: NEGATIVE
RBC, UA: NEGATIVE
Specific Gravity, UA: 1.025 (ref 1.005–1.030)
Urobilinogen, Ur: 0.2 mg/dL (ref 0.2–1.0)
pH, UA: 5 (ref 5.0–7.5)

## 2022-08-09 MED ORDER — CEPHALEXIN 500 MG PO CAPS
500.0000 mg | ORAL_CAPSULE | Freq: Two times a day (BID) | ORAL | 0 refills | Status: AC
Start: 2022-08-09 — End: 2022-08-16

## 2022-08-09 NOTE — Progress Notes (Signed)
Virtual Visit via Video Note  I connected with Kimberly Donaldson on 08/09/22 at  8:35 AM EDT by a video enabled telemedicine application and verified that I am speaking with the correct person using two identifiers.  Patient Location: Home Provider Location: Office/Clinic  I discussed the limitations, risks, security, and privacy concerns of performing an evaluation and management service by video and the availability of in person appointments. I also discussed with the patient that there may be a patient responsible charge related to this service. The patient expressed understanding and agreed to proceed.  Subjective: PCP: Dettinger, Elige Radon, MD  Chief Complaint  Patient presents with   Urinary Tract Infection   Urinary Tract Infection   States that she has increased frequency of urination, states she is going every hour, Endorses burning sensation and smell. Has history of UTI. Denies fever, low back pain. Denies itching and discharge. Is drinking cranberry juice to help it. Symptoms started 3 days ago. No new sexual partner, abstinence for 6 years.   ROS: Per HPI  Current Outpatient Medications:    amLODipine (NORVASC) 5 MG tablet, Take 1 tablet (5 mg total) by mouth daily., Disp: 90 tablet, Rfl: 3   aspirin EC 81 MG tablet, Take 81 mg by mouth daily. Swallow whole., Disp: , Rfl:    buPROPion (WELLBUTRIN XL) 150 MG 24 hr tablet, Take 1 tablet (150 mg total) by mouth daily., Disp: 90 tablet, Rfl: 3   collagenase (SANTYL) 250 UNIT/GM ointment, Apply 1 Application topically daily., Disp: 30 g, Rfl: 0   ibuprofen (ADVIL,MOTRIN) 200 MG tablet, Take 800 mg by mouth every 6 (six) hours as needed for moderate pain., Disp: , Rfl:    lisinopril (ZESTRIL) 20 MG tablet, Take 2 tablets (40 mg total) by mouth daily., Disp: 180 tablet, Rfl: 3   loperamide (IMODIUM) 2 MG capsule, Take 2 mg by mouth as needed for diarrhea or loose stools., Disp: , Rfl:    Multiple Vitamins-Minerals (AIRBORNE PO),  Take 1 tablet by mouth daily., Disp: , Rfl:    nystatin-triamcinolone ointment (MYCOLOG), Apply 1 Application topically 2 (two) times daily., Disp: 30 g, Rfl: 3   Omega-3 Fatty Acids (FISH OIL) 1000 MG CAPS, Take 1,000 mg by mouth daily., Disp: , Rfl:    rosuvastatin (CRESTOR) 5 MG tablet, Take 1 tablet (5 mg total) by mouth daily., Disp: 90 tablet, Rfl: 3   Semaglutide,0.25 or 0.5MG /DOS, (OZEMPIC, 0.25 OR 0.5 MG/DOSE,) 2 MG/1.5ML SOPN, Inject 0.5 mg into the skin once a week., Disp: 4.5 mL, Rfl: 3   tobramycin (TOBREX) 0.3 % ophthalmic solution, Place 1 drop into both eyes See admin instructions. Used before eye injections every 3 months, Disp: , Rfl:   Observations/Objective: There were no vitals filed for this visit. Physical Exam Constitutional:      General: She is awake. She is not in acute distress.    Appearance: Normal appearance. She is well-developed and well-groomed. She is not ill-appearing, toxic-appearing or diaphoretic.  Pulmonary:     Effort: Pulmonary effort is normal.  Neurological:     General: No focal deficit present.     Mental Status: She is alert and oriented to person, place, and time.  Psychiatric:        Attention and Perception: Attention and perception normal.        Mood and Affect: Mood and affect normal.        Speech: Speech normal.        Behavior: Behavior  normal. Behavior is cooperative.        Thought Content: Thought content normal.        Cognition and Memory: Cognition and memory normal.        Judgment: Judgment normal.    Assessment and Plan: 1. Increased frequency of urination 2. Dysuria 3. Malodorous urine Will empirically treat as below for UTI. Instructed patient to drop off UA and Culture. Order placed previously.  - cephALEXin (KEFLEX) 500 MG capsule; Take 1 capsule (500 mg total) by mouth 2 (two) times daily for 7 days.  Dispense: 14 capsule; Refill: 0  The above assessment and management plan was discussed with the patient. The  patient verbalized understanding of and has agreed to the management plan using shared-decision making. Patient is aware to call the clinic if they develop any new symptoms or if symptoms fail to improve or worsen. Patient is aware when to return to the clinic for a follow-up visit. Patient educated on when it is appropriate to go to the emergency department.   Follow Up Instructions: Return if symptoms worsen or fail to improve.   Neale Burly, DNP-FNP [facility] [address] [place], [place] [address] [phone]

## 2022-08-11 LAB — URINE CULTURE

## 2022-08-23 IMAGING — CR DG TIBIA/FIBULA PORT 2V*L*
1 series · 1 of 1 positions shown · non-contrast
Comparison: None Available.

CLINICAL DATA: Intra op arteriogram

EXAM:
PORTABLE LEFT TIBIA AND FIBULA - 2 VIEW

[AP]
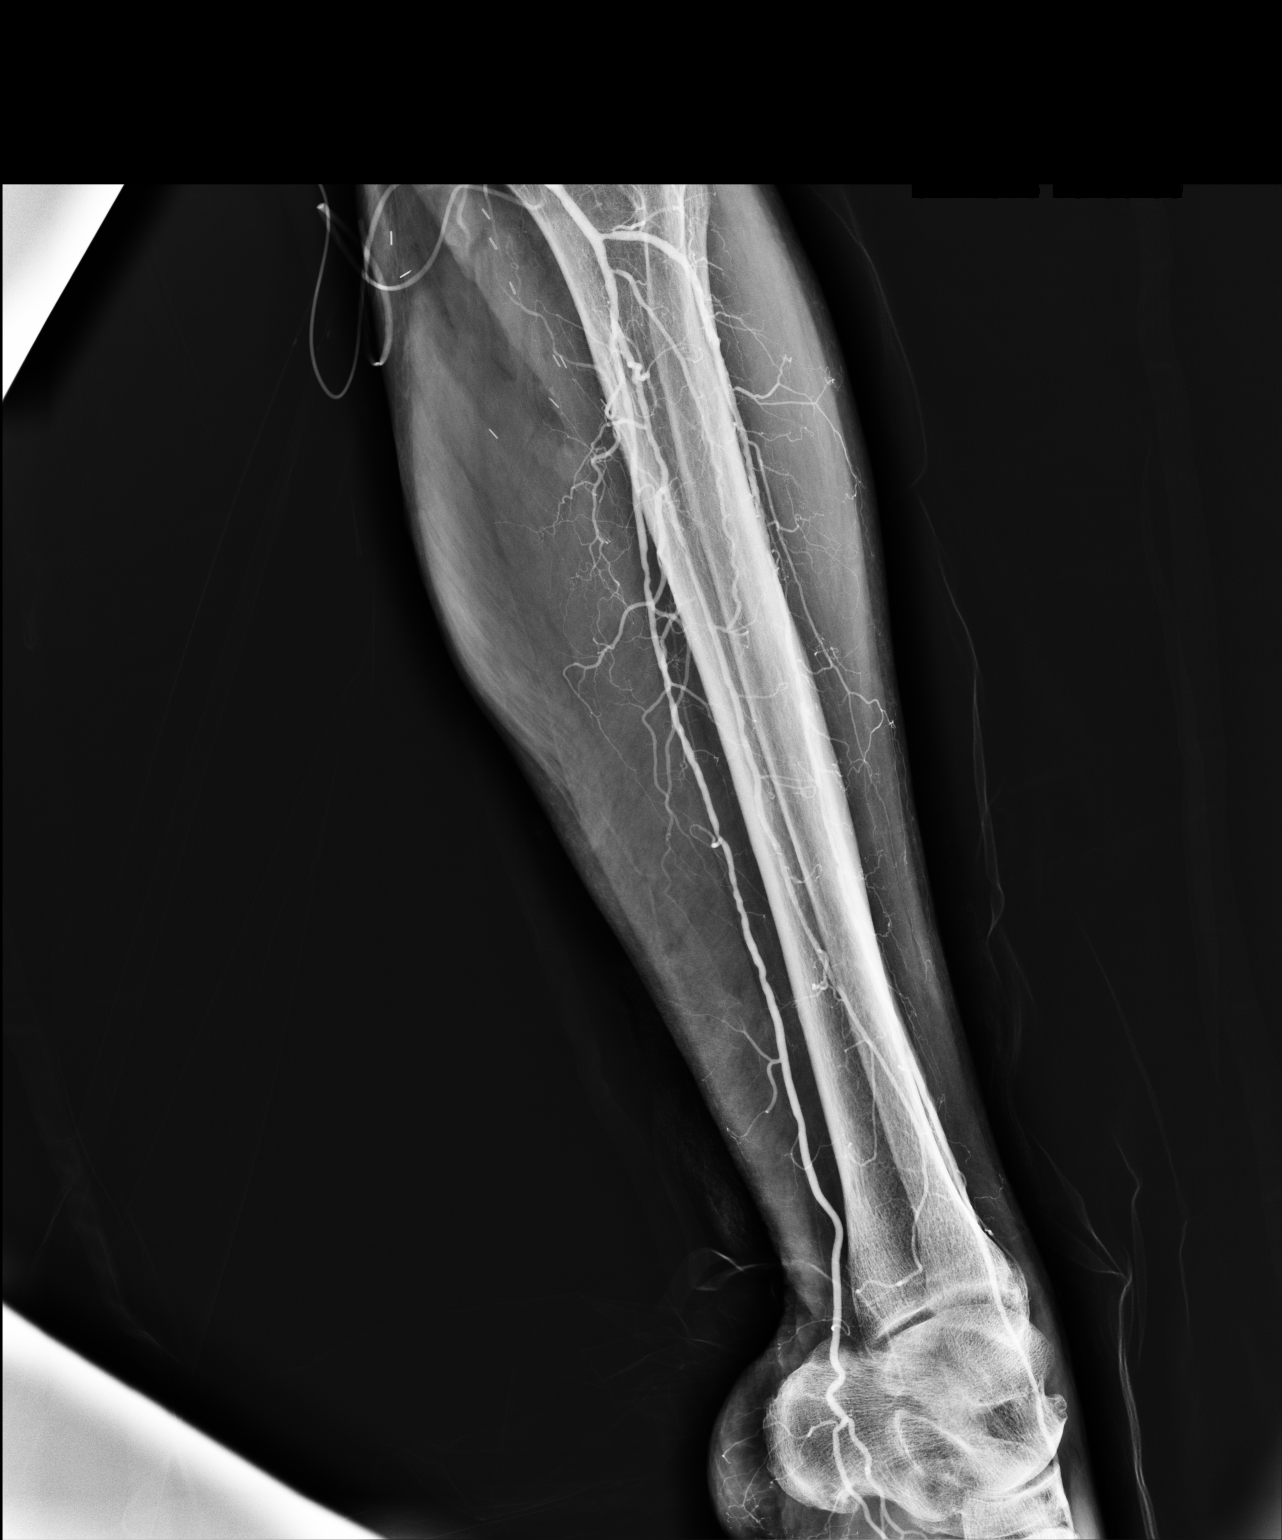

[1 of 1 positions shown; findings below may reference images not displayed]

FINDINGS: Single intraoperative image of left lower leg arteriogram was
provided for interpretation. The submitted image demonstrates
opacification of the popliteal, anterior tibial, peroneal, and
posterior tibial arteries. Anterior tibial artery difficult to
evaluate as it overlies the cortex of the tibia. Multifocal stenoses
seen throughout the posterior tibial artery. Distal anastomosis of
the bypass graft is noted.
IMPRESSION: Single intraoperative image of left lower leg arteriogram as above.

## 2022-09-17 ENCOUNTER — Ambulatory Visit (INDEPENDENT_AMBULATORY_CARE_PROVIDER_SITE_OTHER): Payer: Medicare HMO | Admitting: Nurse Practitioner

## 2022-09-17 ENCOUNTER — Encounter: Payer: Self-pay | Admitting: Nurse Practitioner

## 2022-09-17 VITALS — BP 137/70 | HR 61 | Temp 97.8°F | Ht 64.0 in | Wt 191.4 lb

## 2022-09-17 DIAGNOSIS — N764 Abscess of vulva: Secondary | ICD-10-CM

## 2022-09-17 DIAGNOSIS — N3001 Acute cystitis with hematuria: Secondary | ICD-10-CM

## 2022-09-17 DIAGNOSIS — R300 Dysuria: Secondary | ICD-10-CM | POA: Diagnosis not present

## 2022-09-17 DIAGNOSIS — N491 Inflammatory disorders of spermatic cord, tunica vaginalis and vas deferens: Secondary | ICD-10-CM

## 2022-09-17 LAB — URINALYSIS, ROUTINE W REFLEX MICROSCOPIC
Bilirubin, UA: NEGATIVE
Glucose, UA: NEGATIVE
Nitrite, UA: NEGATIVE
Specific Gravity, UA: 1.02 (ref 1.005–1.030)
Urobilinogen, Ur: 0.2 mg/dL (ref 0.2–1.0)
pH, UA: 5.5 (ref 5.0–7.5)

## 2022-09-17 MED ORDER — CEPHALEXIN 500 MG PO CAPS
500.0000 mg | ORAL_CAPSULE | Freq: Four times a day (QID) | ORAL | 0 refills | Status: DC
Start: 2022-09-17 — End: 2022-10-26

## 2022-09-17 NOTE — Progress Notes (Signed)
Acute Office Visit  Subjective:     Patient ID: Kimberly Donaldson, female    DOB: 04-11-1955, 67 y.o.   MRN: [id number]  Chief Complaint  Patient presents with   Recurrent Skin Infections    Boil in groin area x 4 days    Dysuria    Started this morning     HPI  Kimberly Donaldson is a 67 y.o. female presents today for an acute visit complaining of urinary frequency, urgency and dysuria x 1 days, without flank pain, fever, chills, or abnormal vaginal discharge or bleeding  and boil in the groin. "My daughter and I tried to drain it on Sat, but it hard and painful. She has been taking OTC ibuprofen for the pain, and had similar issues 2 yrs ago. UA and wound culture will be ordered    ROS Negative unless indicated in HPI    Objective:    BP 137/70   Pulse 61   Temp 97.8 F (36.6 C) (Temporal)   Ht 5\' 4"  (1.626 m)   Wt 191 lb 6.4 oz (86.8 kg)   SpO2 98%   BMI 32.85 kg/m  BP Readings from Last 3 Encounters:  09/17/22 137/70  07/13/22 129/65  05/14/22 136/74   Wt Readings from Last 3 Encounters:  09/17/22 191 lb 6.4 oz (86.8 kg)  07/13/22 196 lb (88.9 kg)  05/14/22 195 lb (88.5 kg)      Physical Exam Vitals and nursing note reviewed. Exam conducted with a chaperone present Shanda Bumps Rostosky CMA).  Constitutional:      General: She is not in acute distress.    Appearance: Normal appearance. She is overweight.  HENT:     Head: Normocephalic and atraumatic.  Cardiovascular:     Rate and Rhythm: Normal rate.     Heart sounds: Normal heart sounds.  Pulmonary:     Effort: Pulmonary effort is normal.     Breath sounds: Normal breath sounds.  Abdominal:     General: Bowel sounds are normal. There is distension.     Palpations: Abdomen is soft. There is no mass.     Tenderness: There is no abdominal tenderness. There is no right CVA tenderness, left CVA tenderness or guarding.  Genitourinary:      Comments: Left  groin  boil 5 cm on  Skin:    General: Skin is warm  and dry.     Findings: Abscess and erythema present.          Comments: Purulent boil  Neurological:     General: No focal deficit present.     Mental Status: She is alert and oriented to person, place, and time. Mental status is at baseline.     Urine dipstick shows positive for WBC's, positive for RBC's, positive for protein, and positive for ketones.  Micro exam: not done not enough urine sample will order culture. Client will be treated empirically with broad spectrum ABT while waiting for culture results No results found for any visits on 09/17/22.      Assessment & Plan:  Dysuria -     Urinalysis, Routine w reflex microscopic -     Cephalexin; Take 1 capsule (500 mg total) by mouth 4 (four) times daily.  Dispense: 20 capsule; Refill: 0 -     Urine Culture  Boil of tunica vaginalis -     Aerobic culture -     Cephalexin; Take 1 capsule (500 mg total) by mouth 4 (four) times daily.  Dispense: 20 capsule; Refill: 0 -     I & D  Acute cystitis with hematuria -     Cephalexin; Take 1 capsule (500 mg total) by mouth 4 (four) times daily.  Dispense: 20 capsule; Refill: 0 -     Urine Culture  ASSESSMENT 67 yrs old female in no acute distress  Boil tunica vaginalis PLAN:  UTI uncomplicated without evidence of pyelonephritis Cephalexin 500 mg QID for 5-days Increase hydration  may use Pyridium OTC prn.  Urine culture order, treating client with broad spectrum ATB while waiting for culture results.  He understood that antibiotic may be changed based on culture results Call or return to clinic prn if these symptoms worsen or fail to improve as anticipated.  Boil tunica vaginalis  Cephalexin 500 mg QID for 5-days   I & D  Date/Time: 09/17/2022 10:36 AM  Performed by: Martina Sinner, NP Authorized by: Martina Sinner, NP   Consent:    Consent obtained:  Written   Consent given by:  Patient   Risks, benefits, and alternatives were discussed: yes      Risks discussed:  Incomplete drainage, pain, bleeding and infection   Alternatives discussed:  No treatment Location:    Type:  Abscess   Size:  5cm   Location:  Anogenital   Anogenital location:  Vulva Pre-procedure details:    Skin preparation:  Chloroxylenol and povidone-iodine Sedation:    Sedation type:  None Anesthesia:    Anesthesia method:  Topical application Procedure type:    Complexity:  Simple Procedure details:    Needle aspiration: no     Incision types:  Single straight   Incision depth:  Dermal   Scalpel blade:  10   Wound management:  Irrigated with saline   Drainage:  Purulent and serosanguinous   Drainage amount:  Scant   Wound treatment:  Wound left open   Packing materials:  None Post-procedure details:    Procedure completion:  Tolerated well, no immediate complications Comments:     Copious amount of purulent/serosanguinous discharged. Wound cover with guaze and dressing.   Return if symptoms worsen or fail to improve.  Arrie Aran Santa Lighter, DNP [facility] [address] [place], [place] [address] [phone]

## 2022-09-17 NOTE — Patient Instructions (Signed)
-   Soak a clean cloth in warm water and hold it against the boil for 10 minutes 4 times a day  clean the area around the boil with antibacterial soap if pus comes out  cover the area with a dressing or gauze until it heals  bathe or shower every day and wash your hands regularly   ibuprofen to ease the pain  wash your towels and bedding at least once a week at high temperature  try to lose weight if you are very overweight and have boils between folds of your skin

## 2022-09-18 LAB — AEROBIC CULTURE

## 2022-09-19 LAB — URINE CULTURE

## 2022-09-21 ENCOUNTER — Other Ambulatory Visit: Payer: Self-pay | Admitting: Family Medicine

## 2022-09-21 LAB — AEROBIC CULTURE

## 2022-09-24 ENCOUNTER — Other Ambulatory Visit: Payer: Self-pay | Admitting: Nurse Practitioner

## 2022-09-24 DIAGNOSIS — N491 Inflammatory disorders of spermatic cord, tunica vaginalis and vas deferens: Secondary | ICD-10-CM

## 2022-09-24 MED ORDER — SULFAMETHOXAZOLE-TRIMETHOPRIM 400-80 MG PO TABS
1.00 | ORAL_TABLET | Freq: Two times a day (BID) | ORAL | 0 refills | Status: AC
Start: 2022-09-24 — End: 2022-10-04

## 2022-09-26 DIAGNOSIS — H35033 Hypertensive retinopathy, bilateral: Secondary | ICD-10-CM | POA: Diagnosis not present

## 2022-09-26 DIAGNOSIS — E113411 Type 2 diabetes mellitus with severe nonproliferative diabetic retinopathy with macular edema, right eye: Secondary | ICD-10-CM | POA: Diagnosis not present

## 2022-09-26 DIAGNOSIS — E113312 Type 2 diabetes mellitus with moderate nonproliferative diabetic retinopathy with macular edema, left eye: Secondary | ICD-10-CM | POA: Diagnosis not present

## 2022-09-26 DIAGNOSIS — H43813 Vitreous degeneration, bilateral: Secondary | ICD-10-CM | POA: Diagnosis not present

## 2022-10-26 ENCOUNTER — Ambulatory Visit (INDEPENDENT_AMBULATORY_CARE_PROVIDER_SITE_OTHER): Payer: Medicare HMO

## 2022-10-26 ENCOUNTER — Ambulatory Visit: Payer: Medicare HMO | Admitting: Family Medicine

## 2022-10-26 VITALS — BP 114/58 | HR 78 | Ht 64.0 in | Wt 191.0 lb

## 2022-10-26 DIAGNOSIS — G8929 Other chronic pain: Secondary | ICD-10-CM

## 2022-10-26 DIAGNOSIS — M546 Pain in thoracic spine: Secondary | ICD-10-CM | POA: Diagnosis not present

## 2022-10-26 DIAGNOSIS — M47814 Spondylosis without myelopathy or radiculopathy, thoracic region: Secondary | ICD-10-CM | POA: Diagnosis not present

## 2022-10-26 DIAGNOSIS — M40204 Unspecified kyphosis, thoracic region: Secondary | ICD-10-CM | POA: Diagnosis not present

## 2022-10-26 DIAGNOSIS — M810 Age-related osteoporosis without current pathological fracture: Secondary | ICD-10-CM | POA: Diagnosis not present

## 2022-10-26 MED ORDER — METHYLPREDNISOLONE ACETATE 80 MG/ML IJ SUSP
80.0000 mg | Freq: Once | INTRAMUSCULAR | Status: AC
Start: 2022-10-26 — End: 2022-10-26
  Administered 2022-10-26: 80 mg via INTRA_ARTICULAR

## 2022-10-26 NOTE — Progress Notes (Signed)
BP (!) 114/58   Pulse 78   Ht 5\' 4"  (1.626 m)   Wt 191 lb (86.6 kg)   SpO2 97%   BMI 32.79 kg/m    Subjective:   Patient ID: Kimberly Donaldson, female    DOB: 1955/10/22, 67 y.o.   MRN: [id number]  HPI: Kimberly Donaldson is a 67 y.o. female presenting on 10/26/2022 for Back Pain (Mid back pain)   HPI Chronic right-sided thoracic back pain. Patient is coming in for chronic right-sided thoracic back pain, she was seen for this back in April and she says it has been going on and off sporadically since then.  She says sometimes it wakes her up at night sometimes and severe.  She has been trying ibuprofen and Voltaren and heating pad and the heating pad is really the only thing that helps.  Relevant past medical, surgical, family and social history reviewed and updated as indicated. Interim medical history since our last visit reviewed. Allergies and medications reviewed and updated.  Review of Systems  Constitutional:  Negative for chills and fever.  Eyes:  Negative for visual disturbance.  Respiratory:  Negative for chest tightness and shortness of breath.   Cardiovascular:  Negative for chest pain and leg swelling.  Genitourinary:  Negative for difficulty urinating and dysuria.  Musculoskeletal:  Positive for back pain and myalgias. Negative for arthralgias and gait problem.  Skin:  Negative for rash.  Neurological:  Negative for light-headedness and headaches.  Psychiatric/Behavioral:  Negative for agitation and behavioral problems.   All other systems reviewed and are negative.   Per HPI unless specifically indicated above   Allergies as of 10/26/2022       Reactions   Codeine    Ringing in ears   Morphine Hives   Other Hives   Sporanox - for nail fungus        Medication List        Accurate as of October 26, 2022  3:09 PM. If you have any questions, ask your nurse or doctor.          STOP taking these medications    cephALEXin 500 MG capsule Commonly known as:  Keflex Stopped by: Elige Radon        TAKE these medications    AIRBORNE PO Take 1 tablet by mouth daily.   amLODipine 5 MG tablet Commonly known as: NORVASC Take 1 tablet (5 mg total) by mouth daily.   aspirin EC 81 MG tablet Take 81 mg by mouth daily. Swallow whole.   buPROPion 150 MG 24 hr tablet Commonly known as: WELLBUTRIN XL Take 1 tablet (150 mg total) by mouth daily.   Fish Oil 1000 MG Caps Take 1,000 mg by mouth daily.   ibuprofen 200 MG tablet Commonly known as: ADVIL Take 800 mg by mouth every 6 (six) hours as needed for moderate pain.   lisinopril 20 MG tablet Commonly known as: ZESTRIL Take 2 tablets (40 mg total) by mouth daily.   loperamide 2 MG capsule Commonly known as: IMODIUM Take 2 mg by mouth as needed for diarrhea or loose stools.   nystatin-triamcinolone ointment Commonly known as: MYCOLOG Apply 1 Application topically 2 (two) times daily.   Ozempic (0.25 or 0.5 MG/DOSE) 2 MG/1.5ML Sopn Generic drug: Semaglutide(0.25 or 0.5MG /DOS) Inject 0.5 mg into the skin once a week.   rosuvastatin 5 MG tablet Commonly known as: Crestor Take 1 tablet (5 mg total) by mouth daily.   Santyl 250 UNIT/GM  ointment Generic drug: collagenase Apply 1 Application topically daily.   tobramycin 0.3 % ophthalmic solution Commonly known as: TOBREX Place 1 drop into both eyes See admin instructions. Used before eye injections every 3 months         Objective:   BP (!) 114/58   Pulse 78   Ht 5\' 4"  (1.626 m)   Wt 191 lb (86.6 kg)   SpO2 97%   BMI 32.79 kg/m  Wt Readings from Last 3 Encounters:  10/26/22 191 lb (86.6 kg)  09/17/22 191 lb 6.4 oz (86.8 kg)  07/13/22 196 lb (88.9 kg)    Physical Exam Vitals and nursing note reviewed.  Constitutional:      General: She is not in acute distress.    Appearance: She is well-developed. She is not diaphoretic.  Eyes:     Conjunctiva/sclera: Conjunctivae normal.  Musculoskeletal:         General: Normal range of motion.     Thoracic back: Tenderness present. No swelling, deformity, spasms or bony tenderness. No scoliosis.       Back:  Skin:    General: Skin is warm and dry.     Findings: No rash.  Neurological:     Mental Status: She is alert and oriented to person, place, and time.     Coordination: Coordination normal.  Psychiatric:        Behavior: Behavior normal.       Assessment & Plan:   Problem List Items Addressed This Visit   None Visit Diagnoses     Chronic right-sided thoracic back pain    -  Primary   Relevant Medications   methylPREDNISolone acetate (DEPO-MEDROL) injection 80 mg (Start on 10/26/2022  3:15 PM)   Other Relevant Orders   DG Thoracic Spine 2 View   Ambulatory referral to Physical Therapy     Intramuscular Depo-Medrol injection given by nurse.  Thoracic x-ray pending.  Await final read from radiology.  Follow up plan: Return if symptoms worsen or fail to improve.  Counseling provided for all of the vaccine components Orders Placed This Encounter  Procedures   DG Thoracic Spine 2 View   Ambulatory referral to Physical Therapy    Arville Care, MD [facility] 10/26/2022, 3:09 PM

## 2022-11-01 ENCOUNTER — Telehealth: Payer: Self-pay | Admitting: Family Medicine

## 2022-11-01 NOTE — Telephone Encounter (Signed)
Patient calling to check on her x ray results from 8/9, aware that PCP is on vacation. Please call back and advise.

## 2022-11-01 NOTE — Telephone Encounter (Signed)
Left message making pt aware that the results are not back yet and that we would call once reviewed by the provider.

## 2022-11-02 ENCOUNTER — Ambulatory Visit: Payer: Medicare HMO

## 2022-11-02 ENCOUNTER — Other Ambulatory Visit: Payer: Self-pay

## 2022-11-02 VITALS — Ht 64.0 in | Wt 190.0 lb

## 2022-11-02 DIAGNOSIS — I739 Peripheral vascular disease, unspecified: Secondary | ICD-10-CM

## 2022-11-02 DIAGNOSIS — Z0000 Encounter for general adult medical examination without abnormal findings: Secondary | ICD-10-CM | POA: Diagnosis not present

## 2022-11-02 NOTE — Progress Notes (Signed)
Subjective:   Kimberly Donaldson is a 67 y.o. female who presents for Medicare Annual (Subsequent) preventive examination.  Visit Complete: Virtual  I connected with  Jolaine Click on 11/02/22 by a audio enabled telemedicine application and verified that I am speaking with the correct person using two identifiers.  Patient Location: Home  Provider Location: Home Office  I discussed the limitations of evaluation and management by telemedicine. The patient expressed understanding and agreed to proceed.  Patient Medicare AWV questionnaire was completed by the patient on 11/02/2022; I have confirmed that all information answered by patient is correct and no changes since this date.  Review of Systems    Vital Signs: Unable to obtain new vitals due to this being a telehealth visit.    Nutrition Risk Assessment:  Has the patient had any N/V/D within the last 2 months?  No  Does the patient have any non-healing wounds?  No  Has the patient had any unintentional weight loss or weight gain?  No   Diabetes:  Is the patient diabetic?  Yes  If diabetic, was a CBG obtained today?  No  Did the patient bring in their glucometer from home?  No  How often do you monitor your CBG's? Never .   Financial Strains and Diabetes Management:  Are you having any financial strains with the device, your supplies or your medication? No .  Does the patient want to be seen by Chronic Care Management for management of their diabetes?  No  Would the patient like to be referred to a Nutritionist or for Diabetic Management?  No   Diabetic Exams:  Diabetic Eye Exam: Overdue for diabetic eye exam. Pt has been advised about the importance in completing this exam. Patient advised to call and schedule an eye exam. Diabetic Foot Exam: Overdue, Pt has been advised about the importance in completing this exam. Pt is scheduled for diabetic foot exam on next office visit .     Objective:    Today's Vitals    11/02/22 0804  Weight: 190 lb (86.2 kg)  Height: 5\' 4"  (1.626 m)   Body mass index is 32.61 kg/m.     11/02/2022    8:09 AM 10/31/2021   12:16 PM 07/18/2021    6:04 AM 07/12/2021    8:03 AM 07/07/2021    6:15 AM 05/23/2021    5:43 PM 05/19/2014    8:44 AM  Advanced Directives  Does Patient Have a Medical Advance Directive? No No No No No No No  Would patient like information on creating a medical advance directive? Yes (MAU/Ambulatory/Procedural Areas - Information given) No - Patient declined No - Patient declined No - Patient declined No - Patient declined  No - patient declined information    Current Medications (verified) Outpatient Encounter Medications as of 11/02/2022  Medication Sig   amLODipine (NORVASC) 5 MG tablet Take 1 tablet (5 mg total) by mouth daily.   aspirin EC 81 MG tablet Take 81 mg by mouth daily. Swallow whole.   buPROPion (WELLBUTRIN XL) 150 MG 24 hr tablet Take 1 tablet (150 mg total) by mouth daily.   collagenase (SANTYL) 250 UNIT/GM ointment Apply 1 Application topically daily.   ibuprofen (ADVIL,MOTRIN) 200 MG tablet Take 800 mg by mouth every 6 (six) hours as needed for moderate pain.   lisinopril (ZESTRIL) 20 MG tablet Take 2 tablets (40 mg total) by mouth daily.   loperamide (IMODIUM) 2 MG capsule Take 2 mg by mouth  as needed for diarrhea or loose stools.   Multiple Vitamins-Minerals (AIRBORNE PO) Take 1 tablet by mouth daily.   nystatin-triamcinolone ointment (MYCOLOG) Apply 1 Application topically 2 (two) times daily.   Omega-3 Fatty Acids (FISH OIL) 1000 MG CAPS Take 1,000 mg by mouth daily.   rosuvastatin (CRESTOR) 5 MG tablet Take 1 tablet (5 mg total) by mouth daily.   Semaglutide,0.25 or 0.5MG /DOS, (OZEMPIC, 0.25 OR 0.5 MG/DOSE,) 2 MG/1.5ML SOPN Inject 0.5 mg into the skin once a week.   tobramycin (TOBREX) 0.3 % ophthalmic solution Place 1 drop into both eyes See admin instructions. Used before eye injections every 3 months   No facility-administered  encounter medications on file as of 11/02/2022.    Allergies (verified) Codeine, Morphine, and Other   History: Past Medical History:  Diagnosis Date   Arthritis    Diabetes mellitus without complication (HCC)    Hypertension    Retina disorder, bilateral    Past Surgical History:  Procedure Laterality Date   ABDOMINAL AORTOGRAM W/LOWER EXTREMITY N/A 07/07/2021   Procedure: ABDOMINAL AORTOGRAM W/LOWER EXTREMITY;  Surgeon: Chuck Hint, MD;  Location: [facility] INVASIVE CV LAB;  Service: Cardiovascular;  Laterality: N/A;   CESAREAN SECTION     CHOLECYSTECTOMY     COLONOSCOPY  11/11/2007   Dr.Jacobs   EYE SURGERY     bilateral cataracts   FEMORAL-POPLITEAL BYPASS GRAFT Left 07/18/2021   Procedure: LEFT FEMORAL-POPLITEAL ARTERY BYPASS WITH VEIN;  Surgeon: Chuck Hint, MD;  Location: [facility] OR;  Service: Vascular;  Laterality: Left;   FOOT SURGERY     FRACTURE SURGERY     INTRAOPERATIVE ARTERIOGRAM Left 07/18/2021   Procedure: INTRA OPERATIVE ARTERIOGRAM;  Surgeon: Chuck Hint, MD;  Location: [facility] OR;  Service: Vascular;  Laterality: Left;   TUBAL LIGATION     WRIST SURGERY     Family History  Problem Relation Age of Onset   Cancer Mother    Liver cancer Mother    Colon polyps Father    Cancer Father    Diabetes Father    Bladder Cancer Father    Colon cancer Neg Hx    Esophageal cancer Neg Hx    Pancreatic cancer Neg Hx    Stomach cancer Neg Hx    Breast cancer Neg Hx    Social History   Socioeconomic History   Marital status: Widowed    Spouse name: Not on file   Number of children: 4   Years of education: Not on file   Highest education level: Some college, no degree  Occupational History   Occupation: retired  Tobacco Use   Smoking status: Every Day    Current packs/day: 1.00    Types: Cigarettes    Passive exposure: Never   Smokeless tobacco: Never  Vaping Use   Vaping status: Never Used  Substance and Sexual Activity   Alcohol use:  Yes    Comment: occ   Drug use: Yes    Types: Marijuana    Comment: gummies every 2-3 months   Sexual activity: Not Currently    Birth control/protection: Post-menopausal  Other Topics Concern   Not on file  Social History Narrative   Daughter and 2 grandchildren live with her   5 grandchildren   Social Determinants of Health   Financial Resource Strain: Low Risk  (10/26/2022)   Overall Financial Resource Strain (CARDIA)    Difficulty of Paying Living Expenses: Not very hard  Food Insecurity: Food Insecurity Present (11/01/2022)  Hunger Vital Sign    Worried About Running Out of Food in the Last Year: Sometimes true    Ran Out of Food in the Last Year: Patient declined  Transportation Needs: No Transportation Needs (11/01/2022)   PRAPARE - Administrator, Civil Service (Medical): No    Lack of Transportation (Non-Medical): No  Physical Activity: Inactive (11/02/2022)   Exercise Vital Sign    Days of Exercise per Week: 0 days    Minutes of Exercise per Session: 0 min  Stress: No Stress Concern Present (11/02/2022)   [employer] of Occupational Health - Occupational Stress Questionnaire    Feeling of Stress : Not at all  Recent Concern: Stress - Stress Concern Present (10/26/2022)   [employer] of Occupational Health - Occupational Stress Questionnaire    Feeling of Stress : To some extent  Social Connections: Moderately Integrated (11/01/2022)   Social Connection and Isolation Panel [NHANES]    Frequency of Communication with Friends and Family: Three times a week    Frequency of Social Gatherings with Friends and Family: Three times a week    Attends Religious Services: More than 4 times per year    Active Member of Clubs or Organizations: Yes    Attends Banker Meetings: More than 4 times per year    Marital Status: Widowed    Tobacco Counseling Ready to quit: No Counseling given: Not Answered   Clinical Intake:  Pre-visit preparation  completed: Yes  Pain : No/denies pain     Nutritional Risks: None Diabetes: No  How often do you need to have someone help you when you read instructions, pamphlets, or other written materials from your doctor or pharmacy?: 1 - Never  Interpreter Needed?: No  Information entered by :: Renie Ora, LPN   Activities of Daily Living    11/01/2022    8:55 AM  In your present state of health, do you have any difficulty performing the following activities:  Hearing? 0  Vision? 0  Difficulty concentrating or making decisions? 0  Walking or climbing stairs? 0  Dressing or bathing? 0  Doing errands, shopping? 0  Preparing Food and eating ? N  Using the Toilet? N  In the past six months, have you accidently leaked urine? Y  Do you have problems with loss of bowel control? Y  Managing your Medications? N  Managing your Finances? N  Housekeeping or managing your Housekeeping? N    Patient Care Team: Dettinger, Elige Radon, MD as PCP - General (Family Medicine)  Indicate any recent Medical Services you may have received from other than [facility] providers in the past year (date may be approximate).     Assessment:   This is a routine wellness examination for [place].  Hearing/Vision screen Vision Screening - Comments:: Wears rx glasses - up to date with routine eye exams with  Dr.Johnson   Dietary issues and exercise activities discussed:     Goals Addressed             This Visit's Progress    Exercise 3x per week (30 min per time)         Depression Screen    11/02/2022    8:07 AM 10/26/2022    2:47 PM 09/17/2022    9:52 AM 07/13/2022   10:48 AM 05/14/2022    8:57 AM 12/21/2021    9:07 AM 12/04/2021    9:03 AM  PHQ 2/9 Scores  PHQ - 2 Score 0  0 0 0 0 0  PHQ- 9 Score 0  0 0  0 0  Exception Documentation  Patient refusal         Fall Risk    11/02/2022    8:05 AM 11/01/2022    8:55 AM 09/17/2022    9:52 AM 07/13/2022   10:58 AM 05/14/2022    8:57 AM  Fall Risk    Falls in the past year? 0 0 0 0 0  Number falls in past yr: 0   0   Injury with Fall? 0 0  0   Risk for fall due to : No Fall Risks   No Fall Risks   Follow up Falls prevention discussed   Education provided     MEDICARE RISK AT HOME:  Medicare Risk at Home - 11/02/22 0805     Any stairs in or around the home? No    If so, are there any without handrails? No    Home free of loose throw rugs in walkways, pet beds, electrical cords, etc? Yes    Adequate lighting in your home to reduce risk of falls? Yes    Life alert? No    Use of a cane, walker or w/c? No    Grab bars in the bathroom? Yes    Shower chair or bench in shower? Yes    Elevated toilet seat or a handicapped toilet? Yes             TIMED UP AND GO:  Was the test performed?  No    Cognitive Function:        11/02/2022    8:09 AM 10/31/2021   12:17 PM  6CIT Screen  What Year? 0 points 0 points  What month? 0 points 0 points  What time? 0 points 0 points  Count back from 20 0 points 2 points  Months in reverse 0 points 0 points  Repeat phrase 0 points 0 points  Total Score 0 points 2 points    Immunizations Immunization History  Administered Date(s) Administered   Tdap 06/24/2020   Zoster Recombinant(Shingrix) 05/05/2021, 07/28/2021    TDAP status: Up to date  Flu Vaccine status: Declined, Education has been provided regarding the importance of this vaccine but patient still declined. Advised may receive this vaccine at local pharmacy or Health Dept. Aware to provide a copy of the vaccination record if obtained from local pharmacy or Health Dept. Verbalized acceptance and understanding.  Pneumococcal vaccine status: Due, Education has been provided regarding the importance of this vaccine. Advised may receive this vaccine at local pharmacy or Health Dept. Aware to provide a copy of the vaccination record if obtained from local pharmacy or Health Dept. Verbalized acceptance and understanding.  Covid-19  vaccine status: Completed vaccines  Qualifies for Shingles Vaccine? Yes   Zostavax completed Yes   Shingrix Completed?: Yes  Screening Tests Health Maintenance  Topic Date Due   COVID-19 Vaccine (1) Never done   Pneumonia Vaccine 63+ Years old (1 of 2 - PCV) Never done   Diabetic kidney evaluation - Urine ACR  06/19/2016   OPHTHALMOLOGY EXAM  12/05/2021   INFLUENZA VACCINE  10/18/2022   MAMMOGRAM  12/07/2022   HEMOGLOBIN A1C  01/12/2023   Diabetic kidney evaluation - eGFR measurement  07/13/2023   FOOT EXAM  07/13/2023   Medicare Annual Wellness (AWV)  11/02/2023   Colonoscopy  11/02/2027   DTaP/Tdap/Td (2 - Td or Tdap) 06/25/2030   DEXA SCAN  Completed  Hepatitis C Screening  Completed   Zoster Vaccines- Shingrix  Completed   HPV VACCINES  Aged Out    Health Maintenance  Health Maintenance Due  Topic Date Due   COVID-19 Vaccine (1) Never done   Pneumonia Vaccine 68+ Years old (1 of 2 - PCV) Never done   Diabetic kidney evaluation - Urine ACR  06/19/2016   OPHTHALMOLOGY EXAM  12/05/2021   INFLUENZA VACCINE  10/18/2022    Colorectal cancer screening: Type of screening: Colonoscopy. Completed 11/01/2020. Repeat every 7 years  Mammogram status: Completed 12/06/2021. Repeat every year  Bone Density status: Completed 11/03/2021. Results reflect: Bone density results: OSTEOPOROSIS. Repeat every 2 years.  Lung Cancer Screening: (Low Dose CT Chest recommended if Age 69-80 years, 20 pack-year currently smoking OR have quit w/in 15years.) does not qualify.   Lung Cancer Screening Referral: n/a  Additional Screening:  Hepatitis C Screening: does not qualify; Completed 06/24/2020  Vision Screening: Recommended annual ophthalmology exams for early detection of glaucoma and other disorders of the eye. Is the patient up to date with their annual eye exam?  No  Who is the provider or what is the name of the office in which the patient attends annual eye exams? Dr.Johnson  If  pt is not established with a provider, would they like to be referred to a provider to establish care? No .   Dental Screening: Recommended annual dental exams for proper oral hygiene  Diabetic Foot Exam: Diabetic Foot Exam: Overdue, Pt has been advised about the importance in completing this exam. Pt is scheduled for diabetic foot exam on next office visit .  Community Resource Referral / Chronic Care Management: CRR required this visit?  No   CCM required this visit?  No     Plan:     I have personally reviewed and noted the following in the patient's chart:   Medical and social history Use of alcohol, tobacco or illicit drugs  Current medications and supplements including opioid prescriptions. Patient is not currently taking opioid prescriptions. Functional ability and status Nutritional status Physical activity Advanced directives List of other physicians Hospitalizations, surgeries, and ER visits in previous 12 months Vitals Screenings to include cognitive, depression, and falls Referrals and appointments  In addition, I have reviewed and discussed with patient certain preventive protocols, quality metrics, and best practice recommendations. A written personalized care plan for preventive services as well as general preventive health recommendations were provided to patient.     Lorrene Reid, LPN   [id number]   After Visit Summary: (MyChart) Due to this being a telephonic visit, the after visit summary with patients personalized plan was offered to patient via MyChart   Nurse Notes: none

## 2022-11-02 NOTE — Patient Instructions (Signed)
Kimberly Donaldson , Thank you for taking time to come for your Medicare Wellness Visit. I appreciate your ongoing commitment to your health goals. Please review the following plan we discussed and let me know if I can assist you in the future.   Referrals/Orders/Follow-Ups/Clinician Recommendations: Aim for 30 minutes of exercise or brisk walking, 6-8 glasses of water, and 5 servings of fruits and vegetables each day.   This is a list of the screening recommended for you and due dates:  Health Maintenance  Topic Date Due   COVID-19 Vaccine (1) Never done   Pneumonia Vaccine (1 of 2 - PCV) Never done   Yearly kidney health urinalysis for diabetes  06/19/2016   Eye exam for diabetics  12/05/2021   Flu Shot  10/18/2022   Mammogram  12/07/2022   Hemoglobin A1C  01/12/2023   Yearly kidney function blood test for diabetes  07/13/2023   Complete foot exam   07/13/2023   Medicare Annual Wellness Visit  11/02/2023   Colon Cancer Screening  11/02/2027   DTaP/Tdap/Td vaccine (2 - Td or Tdap) 06/25/2030   DEXA scan (bone density measurement)  Completed   Hepatitis C Screening  Completed   Zoster (Shingles) Vaccine  Completed   HPV Vaccine  Aged Out    Advanced directives: (Provided) Advance directive discussed with you today. I have provided a copy for you to complete at home and have notarized. Once this is complete, please bring a copy in to our office so we can scan it into your chart. Information on Advanced Care Planning can be found at [facility] Advance Health Care Directives Advance Health Care Directives ([email or web address]    Next Medicare Annual Wellness Visit scheduled for next year: Yes  Preventive Care 65 Years and Older, Female Preventive care refers to lifestyle choices and visits with your health care provider that can promote health and wellness. What does preventive care include? A yearly physical exam. This is also called an annual well check. Dental exams once or twice  a year. Routine eye exams. Ask your health care provider how often you should have your eyes checked. Personal lifestyle choices, including: Daily care of your teeth and gums. Regular physical activity. Eating a healthy diet. Avoiding tobacco and drug use. Limiting alcohol use. Practicing safe sex. Taking low-dose aspirin every day. Taking vitamin and mineral supplements as recommended by your health care provider. What happens during an annual well check? The services and screenings done by your health care provider during your annual well check will depend on your age, overall health, lifestyle risk factors, and family history of disease. Counseling  Your health care provider may ask you questions about your: Alcohol use. Tobacco use. Drug use. Emotional well-being. Home and relationship well-being. Sexual activity. Eating habits. History of falls. Memory and ability to understand (cognition). Work and work Astronomer. Reproductive health. Screening  You may have the following tests or measurements: Height, weight, and BMI. Blood pressure. Lipid and cholesterol levels. These may be checked every 5 years, or more frequently if you are over 59 years old. Skin check. Lung cancer screening. You may have this screening every year starting at age 43 if you have a 30-pack-year history of smoking and currently smoke or have quit within the past 15 years. Fecal occult blood test (FOBT) of the stool. You may have this test every year starting at age 102. Flexible sigmoidoscopy or colonoscopy. You may have a sigmoidoscopy every 5 years or a colonoscopy  every 10 years starting at age 59. Hepatitis C blood test. Hepatitis B blood test. Sexually transmitted disease (STD) testing. Diabetes screening. This is done by checking your blood sugar (glucose) after you have not eaten for a while (fasting). You may have this done every 1-3 years. Bone density scan. This is done to screen for  osteoporosis. You may have this done starting at age 14. Mammogram. This may be done every 1-2 years. Talk to your health care provider about how often you should have regular mammograms. Talk with your health care provider about your test results, treatment options, and if necessary, the need for more tests. Vaccines  Your health care provider may recommend certain vaccines, such as: Influenza vaccine. This is recommended every year. Tetanus, diphtheria, and acellular pertussis (Tdap, Td) vaccine. You may need a Td booster every 10 years. Zoster vaccine. You may need this after age 1. Pneumococcal 13-valent conjugate (PCV13) vaccine. One dose is recommended after age 65. Pneumococcal polysaccharide (PPSV23) vaccine. One dose is recommended after age 47. Talk to your health care provider about which screenings and vaccines you need and how often you need them. This information is not intended to replace advice given to you by your health care provider. Make sure you discuss any questions you have with your health care provider. Document Released: 04/01/2015 Document Revised: 11/23/2015 Document Reviewed: 01/04/2015 Elsevier Interactive Patient Education  2017 [employer].  Fall Prevention in the Home Falls can cause injuries. They can happen to people of all ages. There are many things you can do to make your home safe and to help prevent falls. What can I do on the outside of my home? Regularly fix the edges of walkways and driveways and fix any cracks. Remove anything that might make you trip as you walk through a door, such as a raised step or threshold. Trim any bushes or trees on the path to your home. Use bright outdoor lighting. Clear any walking paths of anything that might make someone trip, such as rocks or tools. Regularly check to see if handrails are loose or broken. Make sure that both sides of any steps have handrails. Any raised decks and porches should have guardrails on  the edges. Have any leaves, snow, or ice cleared regularly. Use sand or salt on walking paths during winter. Clean up any spills in your garage right away. This includes oil or grease spills. What can I do in the bathroom? Use night lights. Install grab bars by the toilet and in the tub and shower. Do not use towel bars as grab bars. Use non-skid mats or decals in the tub or shower. If you need to sit down in the shower, use a plastic, non-slip stool. Keep the floor dry. Clean up any water that spills on the floor as soon as it happens. Remove soap buildup in the tub or shower regularly. Attach bath mats securely with double-sided non-slip rug tape. Do not have throw rugs and other things on the floor that can make you trip. What can I do in the bedroom? Use night lights. Make sure that you have a light by your bed that is easy to reach. Do not use any sheets or blankets that are too big for your bed. They should not hang down onto the floor. Have a firm chair that has side arms. You can use this for support while you get dressed. Do not have throw rugs and other things on the floor that can make  you trip. What can I do in the kitchen? Clean up any spills right away. Avoid walking on wet floors. Keep items that you use a lot in easy-to-reach places. If you need to reach something above you, use a strong step stool that has a grab bar. Keep electrical cords out of the way. Do not use floor polish or wax that makes floors slippery. If you must use wax, use non-skid floor wax. Do not have throw rugs and other things on the floor that can make you trip. What can I do with my stairs? Do not leave any items on the stairs. Make sure that there are handrails on both sides of the stairs and use them. Fix handrails that are broken or loose. Make sure that handrails are as long as the stairways. Check any carpeting to make sure that it is firmly attached to the stairs. Fix any carpet that is loose  or worn. Avoid having throw rugs at the top or bottom of the stairs. If you do have throw rugs, attach them to the floor with carpet tape. Make sure that you have a light switch at the top of the stairs and the bottom of the stairs. If you do not have them, ask someone to add them for you. What else can I do to help prevent falls? Wear shoes that: Do not have high heels. Have rubber bottoms. Are comfortable and fit you well. Are closed at the toe. Do not wear sandals. If you use a stepladder: Make sure that it is fully opened. Do not climb a closed stepladder. Make sure that both sides of the stepladder are locked into place. Ask someone to hold it for you, if possible. Clearly mark and make sure that you can see: Any grab bars or handrails. First and last steps. Where the edge of each step is. Use tools that help you move around (mobility aids) if they are needed. These include: Canes. Walkers. Scooters. Crutches. Turn on the lights when you go into a dark area. Replace any light bulbs as soon as they burn out. Set up your furniture so you have a clear path. Avoid moving your furniture around. If any of your floors are uneven, fix them. If there are any pets around you, be aware of where they are. Review your medicines with your doctor. Some medicines can make you feel dizzy. This can increase your chance of falling. Ask your doctor what other things that you can do to help prevent falls. This information is not intended to replace advice given to you by your health care provider. Make sure you discuss any questions you have with your health care provider. Document Released: 12/30/2008 Document Revised: 08/11/2015 Document Reviewed: 04/09/2014 Elsevier Interactive Patient Education  2017 [employer].

## 2022-11-13 ENCOUNTER — Other Ambulatory Visit: Payer: Self-pay | Admitting: Family Medicine

## 2022-11-13 DIAGNOSIS — M810 Age-related osteoporosis without current pathological fracture: Secondary | ICD-10-CM

## 2022-11-14 NOTE — Progress Notes (Unsigned)
HISTORY AND PHYSICAL     CC:  follow up. Requesting Provider:  Dettinger, Elige Radon, MD  HPI: This is a 67 y.o. female who is here today for follow up for PAD.  Pt has hx of left femoral to below the knee popliteal artery bypass with vein on 07/18/2021 by Dr. Edilia Bo due to distal left posterior calf ulceration.    Pt was last seen 11/02/2021 and at that time, her wound had completely healed.  She was not having any claudication, new wounds or rest pain.  She did continue to have some decreased sensation.  The pt returns today for follow up.  She is not having any issues.  She denies claudication, rest pain or non healing wounds.  She continues to smoke.  She did quit with Chantix but had a stressful family situation and started back.  She states she is willing to work on quitting. She is compliant with her asa/statin.  The pt is on a statin for cholesterol management.    The pt is on an aspirin.    Other AC:  none The pt is on CCB, ACEI for hypertension.  The pt is  on medication for diabetes. Tobacco hx:  current  Pt does not have family hx of AAA.  Past Medical History:  Diagnosis Date   Arthritis    Diabetes mellitus without complication (HCC)    Hypertension    Retina disorder, bilateral     Past Surgical History:  Procedure Laterality Date   ABDOMINAL AORTOGRAM W/LOWER EXTREMITY N/A 07/07/2021   Procedure: ABDOMINAL AORTOGRAM W/LOWER EXTREMITY;  Surgeon: Chuck Hint, MD;  Location: [facility] INVASIVE CV LAB;  Service: Cardiovascular;  Laterality: N/A;   CESAREAN SECTION     CHOLECYSTECTOMY     COLONOSCOPY  11/11/2007   Dr.Jacobs   EYE SURGERY     bilateral cataracts   FEMORAL-POPLITEAL BYPASS GRAFT Left 07/18/2021   Procedure: LEFT FEMORAL-POPLITEAL ARTERY BYPASS WITH VEIN;  Surgeon: Chuck Hint, MD;  Location: [facility] OR;  Service: Vascular;  Laterality: Left;   FOOT SURGERY     FRACTURE SURGERY     INTRAOPERATIVE ARTERIOGRAM Left 07/18/2021   Procedure:  INTRA OPERATIVE ARTERIOGRAM;  Surgeon: Chuck Hint, MD;  Location[facility] OR;  Service: Vascular;  Laterality: Left;   TUBAL LIGATION     WRIST SURGERY      Allergies  Allergen Reactions   Codeine     Ringing in ears   Morphine Hives   Other Hives    Sporanox - for nail fungus    Current Outpatient Medications  Medication Sig Dispense Refill   amLODipine (NORVASC) 5 MG tablet Take 1 tablet (5 mg total) by mouth daily. 90 tablet 3   aspirin EC 81 MG tablet Take 81 mg by mouth daily. Swallow whole.     buPROPion (WELLBUTRIN XL) 150 MG 24 hr tablet Take 1 tablet (150 mg total) by mouth daily. 90 tablet 3   collagenase (SANTYL) 250 UNIT/GM ointment Apply 1 Application topically daily. 30 g 0   ibuprofen (ADVIL,MOTRIN) 200 MG tablet Take 800 mg by mouth every 6 (six) hours as needed for moderate pain.     lisinopril (ZESTRIL) 20 MG tablet Take 2 tablets (40 mg total) by mouth daily. 180 tablet 3   loperamide (IMODIUM) 2 MG capsule Take 2 mg by mouth as needed for diarrhea or loose stools.     Multiple Vitamins-Minerals (AIRBORNE PO) Take 1 tablet by mouth daily.  nystatin-triamcinolone ointment (MYCOLOG) Apply 1 Application topically 2 (two) times daily. 30 g 3   Omega-3 Fatty Acids (FISH OIL) 1000 MG CAPS Take 1,000 mg by mouth daily.     rosuvastatin (CRESTOR) 5 MG tablet Take 1 tablet (5 mg total) by mouth daily. 90 tablet 3   Semaglutide,0.25 or 0.5MG /DOS, (OZEMPIC, 0.25 OR 0.5 MG/DOSE,) 2 MG/1.5ML SOPN Inject 0.5 mg into the skin once a week. 4.5 mL 3   tobramycin (TOBREX) 0.3 % ophthalmic solution Place 1 drop into both eyes See admin instructions. Used before eye injections every 3 months     No current facility-administered medications for this visit.    Family History  Problem Relation Age of Onset   Cancer Mother    Liver cancer Mother    Colon polyps Father    Cancer Father    Diabetes Father    Bladder Cancer Father    Colon cancer Neg Hx    Esophageal  cancer Neg Hx    Pancreatic cancer Neg Hx    Stomach cancer Neg Hx    Breast cancer Neg Hx     Social History   Socioeconomic History   Marital status: Widowed    Spouse name: Not on file   Number of children: 4   Years of education: Not on file   Highest education level: Some college, no degree  Occupational History   Occupation: retired  Tobacco Use   Smoking status: Every Day    Current packs/day: 1.00    Types: Cigarettes    Passive exposure: Never   Smokeless tobacco: Never  Vaping Use   Vaping status: Never Used  Substance and Sexual Activity   Alcohol use: Yes    Comment: occ   Drug use: Yes    Types: Marijuana    Comment: gummies every 2-3 months   Sexual activity: Not Currently    Birth control/protection: Post-menopausal  Other Topics Concern   Not on file  Social History Narrative   Daughter and 2 grandchildren live with her   5 grandchildren   Social Determinants of Health   Financial Resource Strain: Low Risk  (10/26/2022)   Overall Financial Resource Strain (CARDIA)    Difficulty of Paying Living Expenses: Not very hard  Food Insecurity: Food Insecurity Present (11/01/2022)   Hunger Vital Sign    Worried About Running Out of Food in the Last Year: Sometimes true    Ran Out of Food in the Last Year: Patient declined  Transportation Needs: No Transportation Needs (11/01/2022)   PRAPARE - Administrator, Civil Service (Medical): No    Lack of Transportation (Non-Medical): No  Physical Activity: Inactive (11/02/2022)   Exercise Vital Sign    Days of Exercise per Week: 0 days    Minutes of Exercise per Session: 0 min  Stress: No Stress Concern Present (11/02/2022)   [employer] of Occupational Health - Occupational Stress Questionnaire    Feeling of Stress : Not at all  Recent Concern: Stress - Stress Concern Present (10/26/2022)   [employer] of Occupational Health - Occupational Stress Questionnaire    Feeling of Stress : To some  extent  Social Connections: Moderately Integrated (11/01/2022)   Social Connection and Isolation Panel [NHANES]    Frequency of Communication with Friends and Family: Three times a week    Frequency of Social Gatherings with Friends and Family: Three times a week    Attends Religious Services: More than 4 times per year  Active Member of Clubs or Organizations: Yes    Attends Banker Meetings: More than 4 times per year    Marital Status: Widowed  Intimate Partner Violence: Not At Risk (11/02/2022)   Humiliation, Afraid, Rape, and Kick questionnaire    Fear of Current or Ex-Partner: No    Emotionally Abused: No    Physically Abused: No    Sexually Abused: No     REVIEW OF SYSTEMS:   [X]  denotes positive finding, [ ]  denotes negative finding Cardiac  Comments:  Chest pain or chest pressure:    Shortness of breath upon exertion:    Short of breath when lying flat:    Irregular heart rhythm:        Vascular    Pain in calf, thigh, or hip brought on by ambulation:    Pain in feet at night that wakes you up from your sleep:     Blood clot in your veins:    Leg swelling:         Pulmonary    Oxygen at home:    Productive cough:     Wheezing:         Neurologic    Sudden weakness in arms or legs:     Sudden numbness in arms or legs:     Sudden onset of difficulty speaking or slurred speech:    Temporary loss of vision in one eye:     Problems with dizziness:         Gastrointestinal    Blood in stool:     Vomited blood:         Genitourinary    Burning when urinating:     Blood in urine:        Psychiatric    Major depression:         Hematologic    Bleeding problems:    Problems with blood clotting too easily:        Skin    Rashes or ulcers:        Constitutional    Fever or chills:      PHYSICAL EXAMINATION:  Today's Vitals   11/15/22 0829  BP: 130/66  Pulse: (!) 55  Resp: 18  Temp: 97.7 F (36.5 C)  TempSrc: Temporal  SpO2: 96%   Weight: 188 lb 1.6 oz (85.3 kg)  Height: 5\' 4"  (1.626 m)  PainSc: 0-No pain   Body mass index is 32.29 kg/m.   General:  WDWN in NAD; vital signs documented above Gait: Not observed HENT: WNL, normocephalic Pulmonary: normal non-labored breathing , without wheezing Cardiac: regular HR, without carotid bruits Abdomen: soft, NT; aortic pulse is not palpable Skin: without rashes Vascular Exam/Pulses:  Right Left  Radial 2+ (normal) 2+ (normal)  DP 2+ (normal) 2+ (normal)  PT 2+ (normal) 2+ (normal)   Extremities: without ischemic changes, without Gangrene , without cellulitis; without open wounds; small scab on right 2nd toe that is healing. Musculoskeletal: no muscle wasting or atrophy  Neurologic: A&O X 3 Psychiatric:  The pt has Normal affect.   Non-Invasive Vascular Imaging:   ABI's/TBI's on 11/15/2022: Right:  0.95/0.70 - Great toe pressure: 97 Left:  0.95/0.78 - Great toe pressure: 108  Arterial duplex on 11/15/2022: Left Graft #1: femoropopliteal (below-knee)  +--------------------+--------+--------+--------+--------+                     PSV cm/sStenosisWaveformComments  +--------------------+--------+--------+--------+--------+  Inflow  185             biphasic          +--------------------+--------+--------+--------+--------+  Proximal Anastomosis182             biphasic          +--------------------+--------+--------+--------+--------+  Proximal Graft      67              biphasic          +--------------------+--------+--------+--------+--------+  Mid Graft           80              biphasic          +--------------------+--------+--------+--------+--------+  Distal Graft        106             biphasic          +--------------------+--------+--------+--------+--------+  Distal Anastomosis  99              biphasic          +--------------------+--------+--------+--------+--------+  Outflow            89               biphasic          +--------------------+--------+--------+--------+--------+  Summary:  Left: Widely patent bypass without evidence of stenosis.    Previous ABI's/TBI's on 11/02/2021: Right:  0.97/0.72 - Great toe pressure: 127 Left:  1.02/0.75 - Great toe pressure:  133  Previous arterial duplex on 11/02/2021: Left Graft #1: femoropopliteal (below-knee)  +--------------------+--------+--------+--------+--------+                     PSV cm/sStenosisWaveformComments  +--------------------+--------+--------+--------+--------+  Inflow             155             biphasic          +--------------------+--------+--------+--------+--------+  Proximal Anastomosis173             biphasic          +--------------------+--------+--------+--------+--------+  Proximal Graft      84              biphasic          +--------------------+--------+--------+--------+--------+  Mid Graft           131             biphasic          +--------------------+--------+--------+--------+--------+  Distal Graft        113             biphasic          +--------------------+--------+--------+--------+--------+  Distal Anastomosis  136             biphasic          +--------------------+--------+--------+--------+--------+  Outflow            83              biphasic          +--------------------+--------+--------+--------+--------+   Summary:  Left: Widely patent bypass without evidence of stenosis     ASSESSMENT/PLAN:: 67 y.o. female here for follow up for PAD with hx of  left femoral to below the knee popliteal artery bypass with vein on 07/18/2021 by Dr. Edilia Bo due to distal left posterior calf ulceration.    -pt doing well with palpable pedal pulses bilaterally.  Her bypass is widely patent on  duplex today.   -discussed importance of smoking cessation.  She is going to try to work on this.  -continue asa/statin -pt will f/u in one year  with LLE bypass duplex and ABI.  She will call sooner if any issues before then.   Doreatha Massed, [facility] [phone]  Clinic MD:   Edilia Bo

## 2022-11-15 ENCOUNTER — Ambulatory Visit (INDEPENDENT_AMBULATORY_CARE_PROVIDER_SITE_OTHER)
Admission: RE | Admit: 2022-11-15 | Discharge: 2022-11-15 | Disposition: A | Discharge status: Home / Self Care | Payer: Medicare HMO | Source: Ambulatory Visit | Attending: Vascular Surgery | Admitting: Vascular Surgery

## 2022-11-15 ENCOUNTER — Ambulatory Visit: Payer: Medicare HMO | Admitting: Physician Assistant

## 2022-11-15 ENCOUNTER — Ambulatory Visit (HOSPITAL_COMMUNITY)
Admission: RE | Admit: 2022-11-15 | Discharge: 2022-11-15 | Disposition: A | Discharge status: Home / Self Care | Payer: Medicare HMO | Source: Ambulatory Visit | Attending: Vascular Surgery | Admitting: Vascular Surgery

## 2022-11-15 VITALS — BP 130/66 | HR 55 | Temp 97.7°F | Resp 18 | Ht 64.0 in | Wt 188.1 lb

## 2022-11-15 DIAGNOSIS — I739 Peripheral vascular disease, unspecified: Secondary | ICD-10-CM

## 2022-11-15 LAB — VAS US ABI WITH/WO TBI
Left ABI: 0.95
Right ABI: 0.95

## 2022-11-26 ENCOUNTER — Ambulatory Visit: Payer: Medicare HMO

## 2022-11-30 ENCOUNTER — Other Ambulatory Visit: Payer: Self-pay

## 2022-11-30 DIAGNOSIS — I739 Peripheral vascular disease, unspecified: Secondary | ICD-10-CM

## 2022-12-05 ENCOUNTER — Telehealth: Payer: Self-pay | Admitting: Family Medicine

## 2022-12-05 NOTE — Telephone Encounter (Signed)
  Incoming Patient Call  12/05/2022  REFERRAL REQUEST Telephone Note  Have you been seen at our office for this problem? Yes on 10/26/22 by DR. D. Had an xray done and told if not better a scan could be ordered (Advise that they may need an appointment with their PCP before a referral can be done)  Reason for Referral:left shoulder blade pain Referral discussed with patient: yes  Best contact number of patient for referral team: [phone]    Has patient been seen by a specialist for this issue before: no  Patient provider preference for referral: no Patient location preference for referral: no   Patient notified that referrals can take up to a week or longer to process. If they haven't heard anything within a week they should call back and speak with the referral department.

## 2022-12-07 NOTE — Telephone Encounter (Signed)
Pt aware of provider feedback and voiced understanding.

## 2022-12-07 NOTE — Telephone Encounter (Signed)
A referral at that time was placed for physical therapy, did she ever do physical therapy, for an MRI or anything further can be ordered it is required that she do the physical therapy.  Have her give them a call and make an appointment to do that.  The referral was already in place

## 2022-12-21 DIAGNOSIS — E113312 Type 2 diabetes mellitus with moderate nonproliferative diabetic retinopathy with macular edema, left eye: Secondary | ICD-10-CM | POA: Diagnosis not present

## 2022-12-21 DIAGNOSIS — H43813 Vitreous degeneration, bilateral: Secondary | ICD-10-CM | POA: Diagnosis not present

## 2022-12-21 DIAGNOSIS — E113411 Type 2 diabetes mellitus with severe nonproliferative diabetic retinopathy with macular edema, right eye: Secondary | ICD-10-CM | POA: Diagnosis not present

## 2022-12-21 DIAGNOSIS — H35033 Hypertensive retinopathy, bilateral: Secondary | ICD-10-CM | POA: Diagnosis not present

## 2022-12-21 LAB — HM DIABETES EYE EXAM

## 2023-03-17 DIAGNOSIS — R300 Dysuria: Secondary | ICD-10-CM | POA: Diagnosis not present

## 2023-03-26 DIAGNOSIS — H43813 Vitreous degeneration, bilateral: Secondary | ICD-10-CM | POA: Diagnosis not present

## 2023-03-26 DIAGNOSIS — E113312 Type 2 diabetes mellitus with moderate nonproliferative diabetic retinopathy with macular edema, left eye: Secondary | ICD-10-CM | POA: Diagnosis not present

## 2023-03-26 DIAGNOSIS — H35033 Hypertensive retinopathy, bilateral: Secondary | ICD-10-CM | POA: Diagnosis not present

## 2023-03-26 DIAGNOSIS — E113411 Type 2 diabetes mellitus with severe nonproliferative diabetic retinopathy with macular edema, right eye: Secondary | ICD-10-CM | POA: Diagnosis not present

## 2023-04-08 ENCOUNTER — Other Ambulatory Visit: Payer: Self-pay | Admitting: Family Medicine

## 2023-04-08 DIAGNOSIS — E1169 Type 2 diabetes mellitus with other specified complication: Secondary | ICD-10-CM

## 2023-05-09 ENCOUNTER — Other Ambulatory Visit: Payer: Self-pay | Admitting: Family Medicine

## 2023-05-09 ENCOUNTER — Encounter: Payer: Self-pay | Admitting: Family Medicine

## 2023-05-09 DIAGNOSIS — E1169 Type 2 diabetes mellitus with other specified complication: Secondary | ICD-10-CM

## 2023-05-09 DIAGNOSIS — M79673 Pain in unspecified foot: Secondary | ICD-10-CM | POA: Diagnosis not present

## 2023-05-09 NOTE — Telephone Encounter (Signed)
 LMTCB to schedule appt Letter mailed

## 2023-05-22 ENCOUNTER — Encounter: Payer: Self-pay | Admitting: Family Medicine

## 2023-05-22 ENCOUNTER — Ambulatory Visit: Payer: Medicare HMO | Admitting: Family Medicine

## 2023-05-22 VITALS — BP 149/49 | HR 55 | Ht 64.0 in | Wt 186.0 lb

## 2023-05-22 DIAGNOSIS — Z1231 Encounter for screening mammogram for malignant neoplasm of breast: Secondary | ICD-10-CM

## 2023-05-22 DIAGNOSIS — Z23 Encounter for immunization: Secondary | ICD-10-CM | POA: Diagnosis not present

## 2023-05-22 DIAGNOSIS — I10 Essential (primary) hypertension: Secondary | ICD-10-CM

## 2023-05-22 DIAGNOSIS — E781 Pure hyperglyceridemia: Secondary | ICD-10-CM | POA: Diagnosis not present

## 2023-05-22 DIAGNOSIS — Z7985 Long-term (current) use of injectable non-insulin antidiabetic drugs: Secondary | ICD-10-CM

## 2023-05-22 DIAGNOSIS — E1159 Type 2 diabetes mellitus with other circulatory complications: Secondary | ICD-10-CM | POA: Diagnosis not present

## 2023-05-22 DIAGNOSIS — Z716 Tobacco abuse counseling: Secondary | ICD-10-CM

## 2023-05-22 DIAGNOSIS — I739 Peripheral vascular disease, unspecified: Secondary | ICD-10-CM | POA: Diagnosis not present

## 2023-05-22 DIAGNOSIS — E1169 Type 2 diabetes mellitus with other specified complication: Secondary | ICD-10-CM

## 2023-05-22 LAB — BAYER DCA HB A1C WAIVED: HB A1C (BAYER DCA - WAIVED): 5.3 % (ref 4.8–5.6)

## 2023-05-22 LAB — LIPID PANEL

## 2023-05-22 MED ORDER — BUPROPION HCL ER (XL) 150 MG PO TB24
150.0000 mg | ORAL_TABLET | Freq: Every day | ORAL | 3 refills | Status: AC
Start: 2023-05-22 — End: ?

## 2023-05-22 MED ORDER — AMLODIPINE BESYLATE 5 MG PO TABS: 5.0000 mg | ORAL_TABLET | Freq: Every day | ORAL | 3 refills | Status: AC

## 2023-05-22 MED ORDER — LISINOPRIL 20 MG PO TABS: 40.0000 mg | ORAL_TABLET | Freq: Every day | ORAL | 3 refills | Status: AC

## 2023-05-22 MED ORDER — OZEMPIC (0.25 OR 0.5 MG/DOSE) 2 MG/3ML ~~LOC~~ SOPN: 0.5000 mg | PEN_INJECTOR | SUBCUTANEOUS | 3 refills | Status: DC

## 2023-05-22 MED ORDER — ROSUVASTATIN CALCIUM 5 MG PO TABS: 5.0000 mg | ORAL_TABLET | Freq: Every day | ORAL | 3 refills | Status: AC

## 2023-05-22 NOTE — Progress Notes (Signed)
 BP (!) 149/49   Pulse (!) 55   Ht 5\' 4"  (1.626 m)   Wt 186 lb (84.4 kg)   SpO2 99%   BMI 31.93 kg/m    Subjective:   Patient ID: Kimberly Donaldson, female    DOB: Jul 13, 1955, 68 y.o.   MRN: [id number]  HPI: Kimberly Donaldson is a 68 y.o. female presenting on 05/22/2023 for Medical Management of Chronic Issues   HPI Type 2 diabetes mellitus Patient comes in today for recheck of his diabetes. Patient has been currently taking Ozempic. Patient is currently on an ACE inhibitor/ARB. Patient has not seen an ophthalmologist this year. Patient denies any new issues with their feet. The symptom started onset as an adult hypertension and hyperlipidemia and PAD ARE RELATED TO DM   Hypertension Patient is currently on amlodipine and lisinopril, and their blood pressure today is 149/49. Patient denies any lightheadedness or dizziness. Patient denies headaches, blurred vision, chest pains, shortness of breath, or weakness. Denies any side effects from medication and is content with current medication.   Hyperlipidemia and PAD and hypertriglyceridemia Patient is coming in for recheck of his hyperlipidemia. The patient is currently taking Crestor and fish oils. They deny any issues with myalgias or history of liver damage from it. They deny any focal numbness or weakness or chest pain.   Relevant past medical, surgical, family and social history reviewed and updated as indicated. Interim medical history since our last visit reviewed. Allergies and medications reviewed and updated.  Review of Systems  Constitutional:  Negative for chills and fever.  Eyes:  Negative for visual disturbance.  Respiratory:  Negative for chest tightness and shortness of breath.   Cardiovascular:  Negative for chest pain and leg swelling.  Genitourinary:  Negative for difficulty urinating and dysuria.  Skin:  Negative for rash.  Neurological:  Negative for dizziness, light-headedness and headaches.  Psychiatric/Behavioral:   Negative for agitation and behavioral problems.   All other systems reviewed and are negative.   Per HPI unless specifically indicated above   Allergies as of 05/22/2023       Reactions   Codeine    Ringing in ears   Morphine Hives   Other Hives   Sporanox - for nail fungus        Medication List        Accurate as of May 22, 2023  2:14 PM. If you have any questions, ask your nurse or doctor.          AIRBORNE PO Take 1 tablet by mouth daily.   amLODipine 5 MG tablet Commonly known as: NORVASC Take 1 tablet (5 mg total) by mouth daily.   aspirin EC 81 MG tablet Take 81 mg by mouth daily. Swallow whole.   buPROPion 150 MG 24 hr tablet Commonly known as: WELLBUTRIN XL Take 1 tablet (150 mg total) by mouth daily.   Fish Oil 1000 MG Caps Take 1,000 mg by mouth daily.   ibuprofen 200 MG tablet Commonly known as: ADVIL Take 800 mg by mouth every 6 (six) hours as needed for moderate pain.   lisinopril 20 MG tablet Commonly known as: ZESTRIL Take 2 tablets (40 mg total) by mouth daily.   loperamide 2 MG capsule Commonly known as: IMODIUM Take 2 mg by mouth as needed for diarrhea or loose stools.   nystatin-triamcinolone ointment Commonly known as: MYCOLOG Apply 1 Application topically 2 (two) times daily.   Ozempic (0.25 or 0.5 MG/DOSE) 2 MG/3ML Sopn  Generic drug: Semaglutide(0.25 or 0.5MG /DOS) Inject 0.5 mg into the skin once a week. What changed: additional instructions Changed by: Elige Radon Harutyun Monteverde   rosuvastatin 5 MG tablet Commonly known as: Crestor Take 1 tablet (5 mg total) by mouth daily.   Santyl 250 UNIT/GM ointment Generic drug: collagenase Apply 1 Application topically daily.   tobramycin 0.3 % ophthalmic solution Commonly known as: TOBREX Place 1 drop into both eyes See admin instructions. Used before eye injections every 3 months         Objective:   BP (!) 149/49   Pulse (!) 55   Ht 5\' 4"  (1.626 m)   Wt 186 lb (84.4 kg)    SpO2 99%   BMI 31.93 kg/m   Wt Readings from Last 3 Encounters:  05/22/23 186 lb (84.4 kg)  11/15/22 188 lb 1.6 oz (85.3 kg)  11/02/22 190 lb (86.2 kg)    Physical Exam Vitals and nursing note reviewed.  Constitutional:      General: She is not in acute distress.    Appearance: She is well-developed. She is not diaphoretic.  Eyes:     Conjunctiva/sclera: Conjunctivae normal.  Cardiovascular:     Rate and Rhythm: Normal rate and regular rhythm.     Heart sounds: Normal heart sounds. No murmur heard. Pulmonary:     Effort: Pulmonary effort is normal. No respiratory distress.     Breath sounds: Normal breath sounds. No wheezing.  Musculoskeletal:        General: No swelling.  Skin:    General: Skin is warm and dry.     Findings: No rash.  Neurological:     Mental Status: She is alert and oriented to person, place, and time.     Coordination: Coordination normal.  Psychiatric:        Behavior: Behavior normal.     Results for orders placed or performed in visit on 12/25/22  HM DIABETES EYE EXAM   Collection Time: 12/21/22 12:00 AM  Result Value Ref Range   HM Diabetic Eye Exam Retinopathy (A) No Retinopathy    Assessment & Plan:   Problem List Items Addressed This Visit       Cardiovascular and Mediastinum   HTN (hypertension)   Relevant Medications   amLODipine (NORVASC) 5 MG tablet   lisinopril (ZESTRIL) 20 MG tablet   rosuvastatin (CRESTOR) 5 MG tablet   Other Relevant Orders   CBC with Differential/Platelet   CMP14+EGFR   Lipid panel   Bayer DCA Hb A1c Waived   PAD (peripheral artery disease) (HCC)   Relevant Medications   amLODipine (NORVASC) 5 MG tablet   lisinopril (ZESTRIL) 20 MG tablet   rosuvastatin (CRESTOR) 5 MG tablet     Endocrine   DM2 (diabetes mellitus, type 2) (HCC) - Primary   Relevant Medications   lisinopril (ZESTRIL) 20 MG tablet   rosuvastatin (CRESTOR) 5 MG tablet   Semaglutide,0.25 or 0.5MG /DOS, (OZEMPIC, 0.25 OR 0.5 MG/DOSE,)  2 MG/3ML SOPN   Other Relevant Orders   CBC with Differential/Platelet   CMP14+EGFR   Lipid panel   Bayer DCA Hb A1c Waived   Microalbumin / creatinine urine ratio   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)   Relevant Medications   amLODipine (NORVASC) 5 MG tablet   lisinopril (ZESTRIL) 20 MG tablet   rosuvastatin (CRESTOR) 5 MG tablet   Semaglutide,0.25 or 0.5MG /DOS, (OZEMPIC, 0.25 OR 0.5 MG/DOSE,) 2 MG/3ML SOPN   Other Visit Diagnoses  Encounter for screening mammogram for malignant neoplasm of breast       Relevant Orders   MM 3D SCREENING MAMMOGRAM BILATERAL BREAST     Encounter for smoking cessation counseling       Relevant Medications   buPROPion (WELLBUTRIN XL) 150 MG 24 hr tablet     Encounter for immunization       Relevant Orders   Pneumococcal conjugate vaccine 20-valent (Completed)       A1c is 5.3, looks good. Blood pressure looks okay at 149, she will monitor closely at home, do not want to push dilated either because the diastolic is already low at 49.  Continue current medicine.  No changes Follow up plan: Return in about 3 months (around 08/22/2023), or if symptoms worsen or fail to improve, for Diabetes and hypertension recheck.  Counseling provided for all of the vaccine components Orders Placed This Encounter  Procedures   MM 3D SCREENING MAMMOGRAM BILATERAL BREAST   Pneumococcal conjugate vaccine 20-valent   CBC with Differential/Platelet   CMP14+EGFR   Lipid panel   Bayer DCA Hb A1c Waived   Microalbumin / creatinine urine ratio    Arville Care, MD [facility] 05/22/2023, 2:14 PM

## 2023-05-23 LAB — LIPID PANEL
Cholesterol, Total: 133 mg/dL (ref 100–199)
HDL: 65 mg/dL (ref >39)
LDL CALC COMMENT:: 2 ratio (ref 0.0–4.4)
LDL Chol Calc (NIH): 54 mg/dL (ref 0–99)
Triglycerides: 71 mg/dL (ref 0–149)
VLDL Cholesterol Cal: 14 mg/dL (ref 5–40)

## 2023-05-23 LAB — CBC WITH DIFFERENTIAL/PLATELET
Basophils Absolute: 0.1 10*3/uL (ref 0.0–0.2)
Basos: 1 %
EOS (ABSOLUTE): 0.2 10*3/uL (ref 0.0–0.4)
Eos: 2 %
Hematocrit: 42.3 % (ref 34.0–46.6)
Hemoglobin: 14.1 g/dL (ref 11.1–15.9)
Immature Grans (Abs): 0 10*3/uL (ref 0.0–0.1)
Immature Granulocytes: 0 %
Lymphocytes Absolute: 3.5 10*3/uL — ABNORMAL HIGH (ref 0.7–3.1)
Lymphs: 38 %
MCH: 29.3 pg (ref 26.6–33.0)
MCHC: 33.3 g/dL (ref 31.5–35.7)
MCV: 88 fL (ref 79–97)
Monocytes Absolute: 0.8 10*3/uL (ref 0.1–0.9)
Monocytes: 8 %
Neutrophils Absolute: 4.6 10*3/uL (ref 1.4–7.0)
Neutrophils: 51 %
Platelets: 241 10*3/uL (ref 150–450)
RBC: 4.82 x10E6/uL (ref 3.77–5.28)
RDW: 13.1 % (ref 11.7–15.4)
WBC: 9.1 10*3/uL (ref 3.4–10.8)

## 2023-05-23 LAB — CMP14+EGFR
ALT: 14 IU/L (ref 0–32)
AST: 17 IU/L (ref 0–40)
Albumin: 4.2 g/dL (ref 3.9–4.9)
Alkaline Phosphatase: 97 IU/L (ref 44–121)
BUN/Creatinine Ratio: 21 (ref 12–28)
BUN: 17 mg/dL (ref 8–27)
Bilirubin Total: 0.3 mg/dL (ref 0.0–1.2)
CO2: 22 mmol/L (ref 20–29)
Calcium: 9 mg/dL (ref 8.7–10.3)
Chloride: 104 mmol/L (ref 96–106)
Creatinine, Ser: 0.82 mg/dL (ref 0.57–1.00)
Globulin, Total: 2.1 g/dL (ref 1.5–4.5)
Glucose: 98 mg/dL (ref 70–99)
Potassium: 4.1 mmol/L (ref 3.5–5.2)
Sodium: 140 mmol/L (ref 134–144)
Total Protein: 6.3 g/dL (ref 6.0–8.5)
eGFR: 78 mL/min/{1.73_m2} (ref >59)

## 2023-05-23 LAB — MICROALBUMIN / CREATININE URINE RATIO
Creatinine, Urine: 106.1 mg/dL
Microalb/Creat Ratio: 237 mg/g{creat} — ABNORMAL HIGH (ref 0–29)
Microalbumin, Urine: 251 ug/mL

## 2023-05-27 ENCOUNTER — Encounter: Payer: Self-pay | Admitting: Family Medicine

## 2023-05-28 DIAGNOSIS — L03032 Cellulitis of left toe: Secondary | ICD-10-CM | POA: Diagnosis not present

## 2023-05-28 DIAGNOSIS — M79676 Pain in unspecified toe(s): Secondary | ICD-10-CM | POA: Diagnosis not present

## 2023-05-28 DIAGNOSIS — L03031 Cellulitis of right toe: Secondary | ICD-10-CM | POA: Diagnosis not present

## 2023-06-11 DIAGNOSIS — L03031 Cellulitis of right toe: Secondary | ICD-10-CM | POA: Diagnosis not present

## 2023-06-11 DIAGNOSIS — L03032 Cellulitis of left toe: Secondary | ICD-10-CM | POA: Diagnosis not present

## 2023-06-12 ENCOUNTER — Inpatient Hospital Stay: Admission: RE | Admit: 2023-06-12 | Source: Ambulatory Visit

## 2023-06-27 ENCOUNTER — Encounter: Payer: Self-pay | Admitting: Family Medicine

## 2023-06-27 ENCOUNTER — Ambulatory Visit: Admitting: Family Medicine

## 2023-06-27 VITALS — BP 129/64 | HR 53 | Temp 98.2°F | Ht 64.0 in | Wt 189.0 lb

## 2023-06-27 DIAGNOSIS — M65341 Trigger finger, right ring finger: Secondary | ICD-10-CM | POA: Diagnosis not present

## 2023-06-27 MED ORDER — METHYLPREDNISOLONE ACETATE 40 MG/ML IJ SUSP
40.0000 mg | Freq: Once | INTRAMUSCULAR | Status: AC
  Administered 2023-06-27: 40 mg via INTRALESIONAL

## 2023-06-27 NOTE — Progress Notes (Signed)
 BP 129/64   Pulse (!) 53   Temp 98.2 F (36.8 C)   Ht 5\' 4"  (1.626 m)   Wt 189 lb (85.7 kg)   SpO2 99%   BMI 32.44 kg/m    Subjective:   Patient ID: Kimberly Donaldson, female    DOB: 1955-04-26, 68 y.o.   MRN: [id number]  HPI: Kimberly Donaldson is a 68 y.o. female presenting on 06/27/2023 for nodule right 4th finger   HPI Right hand trigger finger Patient is coming in because her right hand trigger finger that we already assessed before is still bothering her.  She has been using the Voltaren gel and icing it and trying to keep it moving and using anti-inflammatories and just does not seem to be getting better.  She is coming today because she is ready to try the neck step.  Relevant past medical, surgical, family and social history reviewed and updated as indicated. Interim medical history since our last visit reviewed. Allergies and medications reviewed and updated.  Review of Systems  Constitutional:  Negative for chills and fever.  Eyes:  Negative for visual disturbance.  Respiratory:  Negative for chest tightness and shortness of breath.   Cardiovascular:  Negative for chest pain and leg swelling.  Musculoskeletal:  Positive for arthralgias. Negative for back pain and gait problem.  Skin:  Negative for rash.  Neurological:  Negative for light-headedness and headaches.  Psychiatric/Behavioral:  Negative for agitation and behavioral problems.   All other systems reviewed and are negative.   Per HPI unless specifically indicated above   Allergies as of 06/27/2023       Reactions   Codeine    Ringing in ears   Morphine Hives   Other Hives   Sporanox - for nail fungus        Medication List        Accurate as of June 27, 2023 10:48 AM. If you have any questions, ask your nurse or doctor.          AIRBORNE PO Take 1 tablet by mouth daily.   amLODipine 5 MG tablet Commonly known as: NORVASC Take 1 tablet (5 mg total) by mouth daily.   aspirin EC 81 MG  tablet Take 81 mg by mouth daily. Swallow whole.   buPROPion 150 MG 24 hr tablet Commonly known as: WELLBUTRIN XL Take 1 tablet (150 mg total) by mouth daily.   Fish Oil 1000 MG Caps Take 1,000 mg by mouth daily.   ibuprofen 200 MG tablet Commonly known as: ADVIL Take 800 mg by mouth every 6 (six) hours as needed for moderate pain.   lisinopril 20 MG tablet Commonly known as: ZESTRIL Take 2 tablets (40 mg total) by mouth daily.   loperamide 2 MG capsule Commonly known as: IMODIUM Take 2 mg by mouth as needed for diarrhea or loose stools.   nystatin-triamcinolone ointment Commonly known as: MYCOLOG Apply 1 Application topically 2 (two) times daily.   Ozempic (0.25 or 0.5 MG/DOSE) 2 MG/3ML Sopn Generic drug: Semaglutide(0.25 or 0.5MG /DOS) Inject 0.5 mg into the skin once a week.   rosuvastatin 5 MG tablet Commonly known as: Crestor Take 1 tablet (5 mg total) by mouth daily.   Santyl 250 UNIT/GM ointment Generic drug: collagenase Apply 1 Application topically daily.   tobramycin 0.3 % ophthalmic solution Commonly known as: TOBREX Place 1 drop into both eyes See admin instructions. Used before eye injections every 3 months  Objective:   BP 129/64   Pulse (!) 53   Temp 98.2 F (36.8 C)   Ht 5\' 4"  (1.626 m)   Wt 189 lb (85.7 kg)   SpO2 99%   BMI 32.44 kg/m   Wt Readings from Last 3 Encounters:  06/27/23 189 lb (85.7 kg)  05/22/23 186 lb (84.4 kg)  11/15/22 188 lb 1.6 oz (85.3 kg)    Physical Exam Vitals and nursing note reviewed.  Constitutional:      Appearance: Normal appearance.  Musculoskeletal:     Right hand: Tenderness present. No bony tenderness. Normal strength. Normal sensation. There is no disruption of two-point discrimination. Normal capillary refill.       Arms:  Neurological:     Mental Status: She is alert.    Right hand trigger finger injection: Consent form signed. Risk factors of bleeding and infection discussed with  patient and patient is agreeable towards injection. Patient prepped with Betadine.  Anterior approach towards injection used. Injected 40 mg of Depo-Medrol and 1 mL of 2% lidocaine. Patient tolerated procedure well and no side effects from noted. Minimal to no bleeding. Simple bandage applied after.    Assessment & Plan:   Problem List Items Addressed This Visit   None Visit Diagnoses       Trigger ring finger of right hand    -  Primary   Relevant Medications   methylPREDNISolone acetate (DEPO-MEDROL) injection 40 mg (Start on 06/27/2023 11:00 AM)       Get a trigger finger injection, if still bothering her after this and not improving then we will consider referral to orthopedics. Follow up plan: Return if symptoms worsen or fail to improve.  Counseling provided for all of the vaccine components No orders of the defined types were placed in this encounter.   Arville Care, MD [facility] 06/27/2023, 10:48 AM

## 2023-07-02 DIAGNOSIS — E113312 Type 2 diabetes mellitus with moderate nonproliferative diabetic retinopathy with macular edema, left eye: Secondary | ICD-10-CM | POA: Diagnosis not present

## 2023-07-02 DIAGNOSIS — E113411 Type 2 diabetes mellitus with severe nonproliferative diabetic retinopathy with macular edema, right eye: Secondary | ICD-10-CM | POA: Diagnosis not present

## 2023-07-02 DIAGNOSIS — H35033 Hypertensive retinopathy, bilateral: Secondary | ICD-10-CM | POA: Diagnosis not present

## 2023-07-02 DIAGNOSIS — H43813 Vitreous degeneration, bilateral: Secondary | ICD-10-CM | POA: Diagnosis not present

## 2023-07-03 ENCOUNTER — Other Ambulatory Visit: Payer: Self-pay

## 2023-07-03 ENCOUNTER — Telehealth: Payer: Self-pay

## 2023-07-03 DIAGNOSIS — M65341 Trigger finger, right ring finger: Secondary | ICD-10-CM

## 2023-07-03 NOTE — Telephone Encounter (Signed)
 Copied from CRM [id number]. Topic: General - Call Back - No Documentation >> Jul 03, 2023 10:49 AM Kimberly Donaldson wrote: Reason for CRM: Patient calling to inform Dr Dettinger that after the steroid shot in her right hand has not helped much and she would like to know what could be done next Patient number [phone] (M)

## 2023-07-03 NOTE — Telephone Encounter (Signed)
 I can refer to ortho or if she rathers wait for Dr D to return she can.  If she wants referral please see if she has a preference for location

## 2023-07-03 NOTE — Telephone Encounter (Signed)
 Called and spoke with patient. She wants to go ahead with referral. Referral placed and advised patient that she should hear back within 1-2 weeks.

## 2023-07-29 DIAGNOSIS — M65341 Trigger finger, right ring finger: Secondary | ICD-10-CM | POA: Diagnosis not present

## 2023-08-21 ENCOUNTER — Telehealth: Payer: Self-pay | Admitting: Pharmacist

## 2023-08-21 NOTE — Telephone Encounter (Signed)
   This patient is appearing on a report for being at risk of failing the adherence measure for diabetes medications this calendar year.   Medication: semaglutide  (Ozempic )  Last fill date: 06/28/23 for 28 day supply  Left voicemail for patient to return my call at their convenience. and MyChart message sent to patient.   Eutimio Gharibian Dattero Brodee Mauritz, PharmD, BCACP, CPP Clinical Pharmacist, [facility]

## 2023-09-02 ENCOUNTER — Ambulatory Visit: Admitting: Family Medicine

## 2023-10-08 DIAGNOSIS — E113312 Type 2 diabetes mellitus with moderate nonproliferative diabetic retinopathy with macular edema, left eye: Secondary | ICD-10-CM | POA: Diagnosis not present

## 2023-10-08 DIAGNOSIS — H35033 Hypertensive retinopathy, bilateral: Secondary | ICD-10-CM | POA: Diagnosis not present

## 2023-10-08 DIAGNOSIS — H43813 Vitreous degeneration, bilateral: Secondary | ICD-10-CM | POA: Diagnosis not present

## 2023-10-08 DIAGNOSIS — E113411 Type 2 diabetes mellitus with severe nonproliferative diabetic retinopathy with macular edema, right eye: Secondary | ICD-10-CM | POA: Diagnosis not present

## 2023-11-04 ENCOUNTER — Telehealth: Payer: Self-pay | Admitting: Family Medicine

## 2023-11-04 ENCOUNTER — Ambulatory Visit: Payer: Medicare HMO

## 2023-11-04 VITALS — BP 129/64 | HR 53 | Ht 64.0 in | Wt 189.0 lb

## 2023-11-04 DIAGNOSIS — Z0000 Encounter for general adult medical examination without abnormal findings: Secondary | ICD-10-CM

## 2023-11-04 NOTE — Patient Instructions (Addendum)
 Kimberly Donaldson , Thank you for taking time out of your busy schedule to complete your Annual Wellness Visit with me. I enjoyed our conversation and look forward to speaking with you again next year. I, as well as your care team,  appreciate your ongoing commitment to your health goals. Please review the following plan we discussed and let me know if I can assist you in the future. Your Game plan/ To Do List    Referrals: If you haven't heard from the office you've been referred to, please reach out to them at the phone provided.   Follow up Visits: We will see or speak with you next year for your Next Medicare AWV with our clinical staff on 829/26 at 1:50p.m. Have you seen your provider in the last 6 months (3 months if uncontrolled diabetes)? Yes  Clinician Recommendations:  Aim for 30 minutes of exercise or brisk walking, 6-8 glasses of water, and 5 servings of fruits and vegetables each day.      This is a list of the screenings recommended for you:  Health Maintenance  Topic Date Due   COVID-19 Vaccine (1 - 2024-25 season) Never done   Mammogram  12/07/2022   Complete foot exam   07/13/2023   Medicare Annual Wellness Visit  11/02/2023   Flu Shot  10/18/2023   Hemoglobin A1C  11/22/2023   Eye exam for diabetics  12/21/2023   Yearly kidney function blood test for diabetes  05/21/2024   Yearly kidney health urinalysis for diabetes  05/21/2024   Colon Cancer Screening  11/02/2027   DTaP/Tdap/Td vaccine (2 - Td or Tdap) 06/25/2030   Pneumococcal Vaccine for age over 8  Completed   DEXA scan (bone density measurement)  Completed   Hepatitis C Screening  Completed   Zoster (Shingles) Vaccine  Completed   HPV Vaccine  Aged Out   Meningitis B Vaccine  Aged Out    Advanced directives: (Declined) Advance directive discussed with you today. Even though you declined this today, please call our office should you change your mind, and we can give you the proper paperwork for you to fill  out. Advance Care Planning is important because it:  [x]  Makes sure you receive the medical care that is consistent with your values, goals, and preferences  [x]  It provides guidance to your family and loved ones and reduces their decisional burden about whether or not they are making the right decisions based on your wishes.  Follow the link provided in your after visit summary or read over the paperwork we have mailed to you to help you started getting your Advance Directives in place. If you need assistance in completing these, please reach out to us  so that we can help you!  See attachments for Preventive Care and Fall Prevention Tips.

## 2023-11-04 NOTE — Progress Notes (Signed)
 Subjective:   Kimberly Donaldson is a 68 y.o. who presents for a Medicare Wellness preventive visit.  As a reminder, Annual Wellness Visits don't include a physical exam, and some assessments may be limited, especially if this visit is performed virtually. We may recommend an in-person follow-up visit with your provider if needed.  Visit Complete: Virtual I connected with  Kimberly Donaldson on 11/04/23 by a audio enabled telemedicine application and verified that I am speaking with the correct person using two identifiers.  Patient Location: Home  Provider Location: Home Office  I discussed the limitations of evaluation and management by telemedicine. The patient expressed understanding and agreed to proceed.  Vital Signs: Because this visit was a virtual/telehealth visit, some criteria may be missing or patient reported. Any vitals not documented were not able to be obtained and vitals that have been documented are patient reported.  VideoDeclined- This patient declined Librarian, academic. Therefore the visit was completed with audio only.  Persons Participating in Visit: Patient.  AWV Questionnaire: No: Patient Medicare AWV questionnaire was not completed prior to this visit.  Cardiac Risk Factors include: advanced age (>95men, >89 women);diabetes mellitus;hypertension;obesity (BMI >30kg/m2);smoking/ tobacco exposure     Objective:    Today's Vitals   11/04/23 1333  BP: 129/64  Pulse: (!) 53  Weight: 189 lb (85.7 kg)  Height: 5' 4 (1.626 m)   Body mass index is 32.44 kg/m.     11/04/2023    1:40 PM 11/02/2022    8:09 AM 10/31/2021   12:16 PM 07/18/2021    6:04 AM 07/12/2021    8:03 AM 07/07/2021    6:15 AM 05/23/2021    5:43 PM  Advanced Directives  Does Patient Have a Medical Advance Directive? No No No No No No No  Would patient like information on creating a medical advance directive?  Yes (MAU/Ambulatory/Procedural Areas - Information given) No  - Patient declined No - Patient declined No - Patient declined No - Patient declined     Current Medications (verified) Outpatient Encounter Medications as of 11/04/2023  Medication Sig   amLODipine  (NORVASC ) 5 MG tablet Take 1 tablet (5 mg total) by mouth daily.   aspirin  EC 81 MG tablet Take 81 mg by mouth daily. Swallow whole.   buPROPion  (WELLBUTRIN  XL) 150 MG 24 hr tablet Take 1 tablet (150 mg total) by mouth daily.   collagenase  (SANTYL ) 250 UNIT/GM ointment Apply 1 Application topically daily.   ibuprofen (ADVIL,MOTRIN) 200 MG tablet Take 800 mg by mouth every 6 (six) hours as needed for moderate pain.   lisinopril  (ZESTRIL ) 20 MG tablet Take 2 tablets (40 mg total) by mouth daily.   loperamide (IMODIUM) 2 MG capsule Take 2 mg by mouth as needed for diarrhea or loose stools.   Multiple Vitamins-Minerals (AIRBORNE PO) Take 1 tablet by mouth daily.   nystatin -triamcinolone  ointment (MYCOLOG) Apply 1 Application topically 2 (two) times daily.   Omega-3 Fatty Acids (FISH OIL) 1000 MG CAPS Take 1,000 mg by mouth daily.   rosuvastatin  (CRESTOR ) 5 MG tablet Take 1 tablet (5 mg total) by mouth daily.   Semaglutide ,0.25 or 0.5MG /DOS, (OZEMPIC , 0.25 OR 0.5 MG/DOSE,) 2 MG/3ML SOPN Inject 0.5 mg into the skin once a week.   tobramycin (TOBREX) 0.3 % ophthalmic solution Place 1 drop into both eyes See admin instructions. Used before eye injections every 3 months   No facility-administered encounter medications on file as of 11/04/2023.    Allergies (verified) Codeine,  Morphine, and Other   History: Past Medical History:  Diagnosis Date   Arthritis    Diabetes mellitus without complication (HCC)    Hypertension    Retina disorder, bilateral    Past Surgical History:  Procedure Laterality Date   ABDOMINAL AORTOGRAM W/LOWER EXTREMITY N/A 07/07/2021   Procedure: ABDOMINAL AORTOGRAM W/LOWER EXTREMITY;  Surgeon: Eliza Lonni RAMAN, MD;  Location: [facility] CV LAB;  Service:  Cardiovascular;  Laterality: N/A;   CESAREAN SECTION     CHOLECYSTECTOMY     COLONOSCOPY  11/11/2007   Dr.Jacobs   EYE SURGERY     bilateral cataracts   FEMORAL-POPLITEAL BYPASS GRAFT Left 07/18/2021   Procedure: LEFT FEMORAL-POPLITEAL ARTERY BYPASS WITH VEIN;  Surgeon: Eliza Lonni RAMAN, MD;  Location: [facility] OR;  Service: Vascular;  Laterality: Left;   FOOT SURGERY     FRACTURE SURGERY     INTRAOPERATIVE ARTERIOGRAM Left 07/18/2021   Procedure: INTRA OPERATIVE ARTERIOGRAM;  Surgeon: Eliza Lonni RAMAN, MD;  Location: [facility] OR;  Service: Vascular;  Laterality: Left;   TUBAL LIGATION     WRIST SURGERY     Family History  Problem Relation Age of Onset   Cancer Mother    Liver cancer Mother    Colon polyps Father    Cancer Father    Diabetes Father    Bladder Cancer Father    Colon cancer Neg Hx    Esophageal cancer Neg Hx    Pancreatic cancer Neg Hx    Stomach cancer Neg Hx    Breast cancer Neg Hx    Social History   Socioeconomic History   Marital status: Widowed    Spouse name: Not on file   Number of children: 4   Years of education: Not on file   Highest education level: 12th grade  Occupational History   Occupation: retired  Tobacco Use   Smoking status: Every Day    Current packs/day: 1.00    Types: Cigarettes    Passive exposure: Never   Smokeless tobacco: Never  Vaping Use   Vaping status: Never Used  Substance and Sexual Activity   Alcohol use: Yes    Comment: occ   Drug use: Yes    Types: Marijuana    Comment: gummies every 2-3 months   Sexual activity: Not Currently    Birth control/protection: Post-menopausal  Other Topics Concern   Not on file  Social History Narrative   Daughter and 2 grandchildren live with her   5 grandchildren   Social Drivers of Corporate investment banker Strain: Low Risk  (11/04/2023)   Overall Financial Resource Strain (CARDIA)    Difficulty of Paying Living Expenses: Not hard at all  Food Insecurity: Food  Insecurity Present (11/04/2023)   Hunger Vital Sign    Worried About Running Out of Food in the Last Year: Sometimes true    Ran Out of Food in the Last Year: Never true  Transportation Needs: No Transportation Needs (11/04/2023)   PRAPARE - Administrator, Civil Service (Medical): No    Lack of Transportation (Non-Medical): No  Physical Activity: Inactive (11/04/2023)   Exercise Vital Sign    Days of Exercise per Week: 0 days    Minutes of Exercise per Session: 0 min  Stress: No Stress Concern Present (11/04/2023)   [employer] of Occupational Health - Occupational Stress Questionnaire    Feeling of Stress: Only a little  Social Connections: Moderately Integrated (11/04/2023)   Social Connection and Isolation  Panel    Frequency of Communication with Friends and Family: More than three times a week    Frequency of Social Gatherings with Friends and Family: Once a week    Attends Religious Services: More than 4 times per year    Active Member of [employer] or Organizations: Yes    Attends Banker Meetings: More than 4 times per year    Marital Status: Widowed    Tobacco Counseling Ready to quit: No Counseling given: Yes    Clinical Intake:  Pre-visit preparation completed: Yes  Pain : No/denies pain     BMI - recorded: 32.44 Nutritional Status: BMI > 30  Obese Nutritional Risks: None Diabetes: Yes  Lab Results  Component Value Date   HGBA1C 5.3 05/22/2023   HGBA1C 6.1 (H) 07/13/2022   HGBA1C 5.7 (H) 07/12/2021     How often do you need to have someone help you when you read instructions, pamphlets, or other written materials from your doctor or pharmacy?: 1 - Never  Interpreter Needed?: No  Information entered by :: alia t/cma   Activities of Daily Living     11/04/2023    1:38 PM  In your present state of health, do you have any difficulty performing the following activities:  Hearing? 0  Vision? 0  Difficulty concentrating or making  decisions? 0  Walking or climbing stairs? 0  Dressing or bathing? 0  Doing errands, shopping? 0  Preparing Food and eating ? N  Using the Toilet? N  In the past six months, have you accidently leaked urine? Y  Do you have problems with loss of bowel control? Y  Managing your Medications? N  Managing your Finances? N  Housekeeping or managing your Housekeeping? N    Patient Care Team: Dettinger, Fonda LABOR, MD as PCP - General (Family Medicine)  I have updated your Care Teams any recent Medical Services you may have received from other providers in the past year.     Assessment:   This is a routine wellness examination for Kimberly Donaldson.  Hearing/Vision screen Hearing Screening - Comments:: Pt denies hearing dif Vision Screening - Comments:: Pt denies vision dif/pt goes to [place] Retenol w/Dr. Sanders/last 3mos 2025 (10/08/23)   Goals Addressed             This Visit's Progress    Patient Stated   On track    Stay active and healthy - try to quit smoking       Depression Screen     11/04/2023    1:43 PM 06/27/2023   10:35 AM 05/22/2023    1:54 PM 11/02/2022    8:07 AM 10/26/2022    2:47 PM 09/17/2022    9:52 AM 07/13/2022   10:48 AM  PHQ 2/9 Scores  PHQ - 2 Score 1 0 0 0  0 0  PHQ- 9 Score    0  0 0  Exception Documentation     Patient refusal      Fall Risk     11/04/2023    1:35 PM 06/27/2023   10:35 AM 05/22/2023    1:54 PM 11/02/2022    8:05 AM 11/01/2022    8:55 AM  Fall Risk   Falls in the past year? 0 0 0 0 0  Number falls in past yr: 0   0   Injury with Fall? 0   0 0  Risk for fall due to : No Fall Risks   No Fall Risks  Follow up Falls evaluation completed   Falls prevention discussed     MEDICARE RISK AT HOME:  Medicare Risk at Home Any stairs in or around the home?: No If so, are there any without handrails?: No Home free of loose throw rugs in walkways, pet beds, electrical cords, etc?: Yes Adequate lighting in your home to reduce risk of falls?:  Yes Life alert?: No Use of a cane, walker or w/c?: No Grab bars in the bathroom?: Yes Shower chair or bench in shower?: Yes Elevated toilet seat or a handicapped toilet?: Yes  TIMED UP AND GO:  Was the test performed?  no  Cognitive Function: 6CIT completed        11/04/2023    1:41 PM 11/02/2022    8:09 AM 10/31/2021   12:17 PM  6CIT Screen  What Year? 0 points 0 points 0 points  What month? 0 points 0 points 0 points  What time? 0 points 0 points 0 points  Count back from 20 0 points 0 points 2 points  Months in reverse 0 points 0 points 0 points  Repeat phrase 0 points 0 points 0 points  Total Score 0 points 0 points 2 points    Immunizations Immunization History  Administered Date(s) Administered   PNEUMOCOCCAL CONJUGATE-20 05/22/2023   Tdap 06/24/2020   Zoster Recombinant(Shingrix ) 05/05/2021, 07/28/2021    Screening Tests Health Maintenance  Topic Date Due   COVID-19 Vaccine (1 - 2024-25 season) Never done   MAMMOGRAM  12/07/2022   FOOT EXAM  07/13/2023   INFLUENZA VACCINE  10/18/2023   HEMOGLOBIN A1C  11/22/2023   OPHTHALMOLOGY EXAM  12/21/2023   Diabetic kidney evaluation - eGFR measurement  05/21/2024   Diabetic kidney evaluation - Urine ACR  05/21/2024   Medicare Annual Wellness (AWV)  11/03/2024   Colonoscopy  11/02/2027   DTaP/Tdap/Td (2 - Td or Tdap) 06/25/2030   Pneumococcal Vaccine: 50+ Years  Completed   DEXA SCAN  Completed   Hepatitis C Screening  Completed   Zoster Vaccines- Shingrix   Completed   HPV VACCINES  Aged Out   Meningococcal B Vaccine  Aged Out    Health Maintenance  Health Maintenance Due  Topic Date Due   COVID-19 Vaccine (1 - 2024-25 season) Never done   MAMMOGRAM  12/07/2022   FOOT EXAM  07/13/2023   INFLUENZA VACCINE  10/18/2023   Health Maintenance Items Addressed: See Nurse Notes at the end of this note  Additional Screening:  Vision Screening: Recommended annual ophthalmology exams for early detection of  glaucoma and other disorders of the eye. Would you like a referral to an eye doctor? No    Dental Screening: Recommended annual dental exams for proper oral hygiene  Community Resource Referral / Chronic Care Management: CRR required this visit?  No   CCM required this visit?  No   Plan:    I have personally reviewed and noted the following in the patient's chart:   Medical and social history Use of alcohol, tobacco or illicit drugs  Current medications and supplements including opioid prescriptions. Patient is not currently taking opioid prescriptions. Functional ability and status Nutritional status Physical activity Advanced directives List of other physicians Hospitalizations, surgeries, and ER visits in previous 12 months Vitals Screenings to include cognitive, depression, and falls Referrals and appointments  In addition, I have reviewed and discussed with patient certain preventive protocols, quality metrics, and best practice recommendations. A written personalized care plan for preventive services as well as general  preventive health recommendations were provided to patient.   Kimberly Donaldson, CMA   11/04/2023   After Visit Summary: (MyChart) Due to this being a telephonic visit, the after visit summary with patients personalized plan was offered to patient via MyChart   Notes: Nothing significant to report at this time.

## 2023-11-04 NOTE — Telephone Encounter (Signed)
 Appt made 11/13/2023

## 2023-11-04 NOTE — Telephone Encounter (Unsigned)
 Copied from CRM #[id number]. Topic: Appointments - Scheduling Inquiry for Clinic >> Nov 04, 2023  1:54 PM Kimberly Donaldson wrote: Reason for CRM: Patient is needing to schedule her Mammogram

## 2023-11-05 ENCOUNTER — Other Ambulatory Visit: Payer: Self-pay | Admitting: Family Medicine

## 2023-11-05 DIAGNOSIS — E1169 Type 2 diabetes mellitus with other specified complication: Secondary | ICD-10-CM

## 2023-11-08 ENCOUNTER — Other Ambulatory Visit: Payer: Self-pay | Admitting: Family Medicine

## 2023-11-08 DIAGNOSIS — Z1231 Encounter for screening mammogram for malignant neoplasm of breast: Secondary | ICD-10-CM

## 2023-11-13 ENCOUNTER — Ambulatory Visit
Admission: RE | Admit: 2023-11-13 | Discharge: 2023-11-13 | Disposition: A | Discharge status: Home / Self Care | Source: Ambulatory Visit | Attending: Family Medicine

## 2023-11-13 DIAGNOSIS — Z1231 Encounter for screening mammogram for malignant neoplasm of breast: Secondary | ICD-10-CM

## 2023-11-22 ENCOUNTER — Encounter (HOSPITAL_COMMUNITY)

## 2023-11-22 ENCOUNTER — Ambulatory Visit

## 2023-11-22 ENCOUNTER — Other Ambulatory Visit (HOSPITAL_COMMUNITY)

## 2023-11-27 DIAGNOSIS — M65341 Trigger finger, right ring finger: Secondary | ICD-10-CM | POA: Diagnosis not present

## 2023-12-06 ENCOUNTER — Other Ambulatory Visit: Payer: Self-pay

## 2023-12-06 DIAGNOSIS — I739 Peripheral vascular disease, unspecified: Secondary | ICD-10-CM

## 2023-12-09 ENCOUNTER — Other Ambulatory Visit: Payer: Self-pay | Admitting: Family Medicine

## 2023-12-09 DIAGNOSIS — E1169 Type 2 diabetes mellitus with other specified complication: Secondary | ICD-10-CM

## 2023-12-09 MED ORDER — OZEMPIC (0.25 OR 0.5 MG/DOSE) 2 MG/3ML ~~LOC~~ SOPN: 0.5000 mg | PEN_INJECTOR | SUBCUTANEOUS | 0 refills | Status: DC

## 2023-12-09 NOTE — Telephone Encounter (Signed)
 Dettinger NTBS 30-d given 11/05/23

## 2023-12-09 NOTE — Addendum Note (Signed)
 Addended by: Adaira Centola D on: 12/09/2023 03:55 PM   Modules accepted: Orders

## 2023-12-09 NOTE — Telephone Encounter (Signed)
 Apt scheduled 01/03/2024

## 2024-01-03 ENCOUNTER — Ambulatory Visit: Admitting: Family Medicine

## 2024-01-09 NOTE — Progress Notes (Unsigned)
 HISTORY AND PHYSICAL     CC:  follow up. Requesting Provider:  Dettinger, Fonda LABOR, MD  HPI: This is a 68 y.o. female who is here today for follow up for PAD.  Pt has hx of  left femoral to below the knee popliteal artery bypass with vein on 07/18/2021 by Dr. Eliza due to distal left posterior calf ulceration.    Pt was last seen 11/15/2022 and at that time, she was not having any claudication, rest pain or non healing wounds.  Although she had quit smoking, she had started back due to stressful family situation.  She was willing to work on quitting.  She was compliant with asa/statin  The pt returns today for follow up.  She is not having any issues.  She denies any claudication, rest pain or non healing wounds.  She continues to smoke.  She is diabetic.  She denies any temporary blindness, speech issues or unilateral weakness, numbness, paralysis.  She is compliant with asa/statin.    The pt is on a statin for cholesterol management.    The pt is on an aspirin .    Other AC:  none The pt is on CCB, ACEI for hypertension.  The pt is not on diabetic medication. Tobacco hx:  current  Pt does not have family hx of AAA.  Past Medical History:  Diagnosis Date   Arthritis    Diabetes mellitus without complication (HCC)    Hypertension    Retina disorder, bilateral     Past Surgical History:  Procedure Laterality Date   ABDOMINAL AORTOGRAM W/LOWER EXTREMITY N/A 07/07/2021   Procedure: ABDOMINAL AORTOGRAM W/LOWER EXTREMITY;  Surgeon: Eliza Lonni RAMAN, MD;  Location: [facility] INVASIVE CV LAB;  Service: Cardiovascular;  Laterality: N/A;   CESAREAN SECTION     CHOLECYSTECTOMY     COLONOSCOPY  11/11/2007   Dr.Jacobs   EYE SURGERY     bilateral cataracts   FEMORAL-POPLITEAL BYPASS GRAFT Left 07/18/2021   Procedure: LEFT FEMORAL-POPLITEAL ARTERY BYPASS WITH VEIN;  Surgeon: Eliza Lonni RAMAN, MD;  Location: [facility] OR;  Service: Vascular;  Laterality: Left;   FOOT SURGERY     FRACTURE  SURGERY     INTRAOPERATIVE ARTERIOGRAM Left 07/18/2021   Procedure: INTRA OPERATIVE ARTERIOGRAM;  Surgeon: Eliza Lonni RAMAN, MD;  Location: [facility] OR;  Service: Vascular;  Laterality: Left;   TUBAL LIGATION     WRIST SURGERY      Allergies  Allergen Reactions   Codeine     Ringing in ears   Morphine Hives   Other Hives    Sporanox - for nail fungus    Current Outpatient Medications  Medication Sig Dispense Refill   amLODipine  (NORVASC ) 5 MG tablet Take 1 tablet (5 mg total) by mouth daily. 90 tablet 3   aspirin  EC 81 MG tablet Take 81 mg by mouth daily. Swallow whole.     buPROPion  (WELLBUTRIN  XL) 150 MG 24 hr tablet Take 1 tablet (150 mg total) by mouth daily. 90 tablet 3   collagenase  (SANTYL ) 250 UNIT/GM ointment Apply 1 Application topically daily. 30 g 0   ibuprofen (ADVIL,MOTRIN) 200 MG tablet Take 800 mg by mouth every 6 (six) hours as needed for moderate pain.     lisinopril  (ZESTRIL ) 20 MG tablet Take 2 tablets (40 mg total) by mouth daily. 180 tablet 3   loperamide (IMODIUM) 2 MG capsule Take 2 mg by mouth as needed for diarrhea or loose stools.     Multiple Vitamins-Minerals (AIRBORNE PO)  Take 1 tablet by mouth daily.     nystatin -triamcinolone  ointment (MYCOLOG) Apply 1 Application topically 2 (two) times daily. 30 g 3   Omega-3 Fatty Acids (FISH OIL) 1000 MG CAPS Take 1,000 mg by mouth daily.     rosuvastatin  (CRESTOR ) 5 MG tablet Take 1 tablet (5 mg total) by mouth daily. 90 tablet 3   Semaglutide ,0.25 or 0.5MG /DOS, (OZEMPIC , 0.25 OR 0.5 MG/DOSE,) 2 MG/3ML SOPN Inject 0.5 mg into the skin once a week. 3 mL 0   tobramycin (TOBREX) 0.3 % ophthalmic solution Place 1 drop into both eyes See admin instructions. Used before eye injections every 3 months     No current facility-administered medications for this visit.    Family History  Problem Relation Age of Onset   Cancer Mother    Liver cancer Mother    Colon polyps Father    Cancer Father    Diabetes Father     Bladder Cancer Father    Colon cancer Neg Hx    Esophageal cancer Neg Hx    Pancreatic cancer Neg Hx    Stomach cancer Neg Hx    Breast cancer Neg Hx     Social History   Socioeconomic History   Marital status: Widowed    Spouse name: Not on file   Number of children: 4   Years of education: Not on file   Highest education level: 12th grade  Occupational History   Occupation: retired  Tobacco Use   Smoking status: Every Day    Current packs/day: 1.00    Types: Cigarettes    Passive exposure: Never   Smokeless tobacco: Never  Vaping Use   Vaping status: Never Used  Substance and Sexual Activity   Alcohol use: Yes    Comment: occ   Drug use: Yes    Types: Marijuana    Comment: gummies every 2-3 months   Sexual activity: Not Currently    Birth control/protection: Post-menopausal  Other Topics Concern   Not on file  Social History Narrative   Daughter and 2 grandchildren live with her   5 grandchildren   Social Drivers of Corporate investment banker Strain: Low Risk  (11/04/2023)   Overall Financial Resource Strain (CARDIA)    Difficulty of Paying Living Expenses: Not hard at all  Food Insecurity: Food Insecurity Present (11/04/2023)   Hunger Vital Sign    Worried About Running Out of Food in the Last Year: Sometimes true    Ran Out of Food in the Last Year: Never true  Transportation Needs: No Transportation Needs (11/04/2023)   PRAPARE - Administrator, Civil Service (Medical): No    Lack of Transportation (Non-Medical): No  Physical Activity: Inactive (11/04/2023)   Exercise Vital Sign    Days of Exercise per Week: 0 days    Minutes of Exercise per Session: 0 min  Stress: No Stress Concern Present (11/04/2023)   [employer] of Occupational Health - Occupational Stress Questionnaire    Feeling of Stress: Only a little  Social Connections: Moderately Integrated (11/04/2023)   Social Connection and Isolation Panel    Frequency of Communication with  Friends and Family: More than three times a week    Frequency of Social Gatherings with Friends and Family: Once a week    Attends Religious Services: More than 4 times per year    Active Member of [employer] or Organizations: Yes    Attends Banker Meetings: More than 4 times per  year    Marital Status: Widowed  Intimate Partner Violence: Not At Risk (11/04/2023)   Humiliation, Afraid, Rape, and Kick questionnaire    Fear of Current or Ex-Partner: No    Emotionally Abused: No    Physically Abused: No    Sexually Abused: No     REVIEW OF SYSTEMS:   [X]  denotes positive finding, [ ]  denotes negative finding Cardiac  Comments:  Chest pain or chest pressure:    Shortness of breath upon exertion:    Short of breath when lying flat:    Irregular heart rhythm:        Vascular    Pain in calf, thigh, or hip brought on by ambulation:    Pain in feet at night that wakes you up from your sleep:     Blood clot in your veins:    Leg swelling:         Pulmonary    Oxygen at home:    Productive cough:     Wheezing:         Neurologic    Sudden weakness in arms or legs:     Sudden numbness in arms or legs:     Sudden onset of difficulty speaking or slurred speech:    Temporary loss of vision in one eye:     Problems with dizziness:         Gastrointestinal    Blood in stool:     Vomited blood:         Genitourinary    Burning when urinating:     Blood in urine:        Psychiatric    Major depression:         Hematologic    Bleeding problems:    Problems with blood clotting too easily:        Skin    Rashes or ulcers:        Constitutional    Fever or chills:      PHYSICAL EXAMINATION:  Today's Vitals   01/10/24 0910  BP: (!) 154/59  Pulse: (!) 57  Temp: 97.9 F (36.6 C)  TempSrc: Temporal  Weight: 184 lb 11.2 oz (83.8 kg)  PainSc: 0-No pain   Body mass index is 31.7 kg/m.   General:  WDWN in NAD; vital signs documented above Gait: Not  observed HENT: WNL, normocephalic Pulmonary: normal non-labored breathing , without wheezing Cardiac: regular HR, with faint right carotid bruit Skin: without rashes Vascular Exam/Pulses: Palpable bilateral radial, DP, PT pulses Extremities: without ischemic changes, without Gangrene , without cellulitis; without open wounds Musculoskeletal: no muscle wasting or atrophy  Neurologic: A&O X 3 Psychiatric:  The pt has Normal affect.   Non-Invasive Vascular Imaging:   ABI's/TBI's on 01/10/2024: Right:  0.94/0.81 - Great toe pressure: 140 Left:  1.03/1.03 - Great toe pressure: 178  Arterial duplex on 01/10/2024: Left Graft #1: Fem-Pop  +--------------------+--------+--------+--------+--------+                     PSV cm/sStenosisWaveformComments  +--------------------+--------+--------+--------+--------+  Inflow             135             biphasic          +--------------------+--------+--------+--------+--------+  Proximal Anastomosis136             biphasic          +--------------------+--------+--------+--------+--------+  Proximal Graft      80  biphasic          +--------------------+--------+--------+--------+--------+  Mid Graft           101             biphasic          +--------------------+--------+--------+--------+--------+  Distal Graft        110             biphasic          +--------------------+--------+--------+--------+--------+  Distal Anastomosis  95              biphasic          +--------------------+--------+--------+--------+--------+  Outflow            99              biphasic          +--------------------+--------+--------+--------+--------+   Summary:  Left: No significant change as compared to previous study. Patent Left Fem-Pop BBG.   Previous ABI's/TBI's on 11/15/2022: Right:  0.95/0.70 - Great toe pressure: 97 Left:  0.95/0.78 - Great toe pressure:  108  Previous arterial duplex on  11/15/2022: Left Graft #1: femoropopliteal (below-knee)  +--------------------+--------+--------+--------+--------+                     PSV cm/sStenosisWaveformComments  +--------------------+--------+--------+--------+--------+  Inflow             185             biphasic          +--------------------+--------+--------+--------+--------+  Proximal Anastomosis182             biphasic          +--------------------+--------+--------+--------+--------+  Proximal Graft      67              biphasic          +--------------------+--------+--------+--------+--------+  Mid Graft           80              biphasic          +--------------------+--------+--------+--------+--------+  Distal Graft        106             biphasic          +--------------------+--------+--------+--------+--------+  Distal Anastomosis  99              biphasic          +--------------------+--------+--------+--------+--------+  Outflow            89              biphasic          +--------------------+--------+--------+--------+--------+   Summary:  Left: Widely patent bypass without evidence of stenosis     ASSESSMENT/PLAN:: 68 y.o. female here for follow up for PAD with hx of left femoral to below the knee popliteal artery bypass with vein on 07/18/2021 by Dr. Eliza due to distal left posterior calf ulceration.   PAD -pt with palpable pedal pulses bilaterally.  She remains asymptomatic -continue asa/statin -pt will f/u in one year with LLE arterial duplex and ABI in [place] office.  Smoker -disscussed importance of smoking cessation and that it puts her at risk of heart attack, stroke, and limb loss.  She is at higher risk due to having diabetes.  Also discussed it puts her at risk for pulmonary issues and cancer.   Faint right carotid bruit -pt without any neurological sx.  Will have her return to the [place] office in the next 4-6 weeks for carotid  duplex.  Reviewed stroke sx and if she develops any of these, she knows to go to the ER.    Lucie Apt, [facility] [phone]  Clinic MD:   [place]

## 2024-01-10 ENCOUNTER — Ambulatory Visit (HOSPITAL_BASED_OUTPATIENT_CLINIC_OR_DEPARTMENT_OTHER)
Admission: RE | Admit: 2024-01-10 | Discharge: 2024-01-10 | Disposition: A | Discharge status: Home / Self Care | Source: Ambulatory Visit | Attending: Vascular Surgery | Admitting: Vascular Surgery

## 2024-01-10 ENCOUNTER — Ambulatory Visit (HOSPITAL_COMMUNITY)
Admission: RE | Admit: 2024-01-10 | Discharge: 2024-01-10 | Disposition: A | Discharge status: Home / Self Care | Source: Ambulatory Visit | Attending: Vascular Surgery | Admitting: Vascular Surgery

## 2024-01-10 ENCOUNTER — Ambulatory Visit: Admitting: Physician Assistant

## 2024-01-10 VITALS — BP 154/59 | HR 57 | Temp 97.9°F | Wt 184.7 lb

## 2024-01-10 DIAGNOSIS — I739 Peripheral vascular disease, unspecified: Secondary | ICD-10-CM

## 2024-01-10 DIAGNOSIS — F17200 Nicotine dependence, unspecified, uncomplicated: Secondary | ICD-10-CM | POA: Insufficient documentation

## 2024-01-10 DIAGNOSIS — R0989 Other specified symptoms and signs involving the circulatory and respiratory systems: Secondary | ICD-10-CM | POA: Diagnosis not present

## 2024-01-10 LAB — VAS US ABI WITH/WO TBI
Left ABI: 1.03
Right ABI: 0.96

## 2024-01-15 ENCOUNTER — Ambulatory Visit (INDEPENDENT_AMBULATORY_CARE_PROVIDER_SITE_OTHER)

## 2024-01-15 ENCOUNTER — Ambulatory Visit (INDEPENDENT_AMBULATORY_CARE_PROVIDER_SITE_OTHER): Admitting: Family Medicine

## 2024-01-15 ENCOUNTER — Encounter: Payer: Self-pay | Admitting: Family Medicine

## 2024-01-15 ENCOUNTER — Other Ambulatory Visit: Payer: Self-pay

## 2024-01-15 VITALS — BP 147/58 | HR 88 | Ht 64.0 in | Wt 181.0 lb

## 2024-01-15 DIAGNOSIS — Z7985 Long-term (current) use of injectable non-insulin antidiabetic drugs: Secondary | ICD-10-CM | POA: Diagnosis not present

## 2024-01-15 DIAGNOSIS — E781 Pure hyperglyceridemia: Secondary | ICD-10-CM | POA: Diagnosis not present

## 2024-01-15 DIAGNOSIS — Z780 Asymptomatic menopausal state: Secondary | ICD-10-CM | POA: Diagnosis not present

## 2024-01-15 DIAGNOSIS — Z13820 Encounter for screening for osteoporosis: Secondary | ICD-10-CM

## 2024-01-15 DIAGNOSIS — E113411 Type 2 diabetes mellitus with severe nonproliferative diabetic retinopathy with macular edema, right eye: Secondary | ICD-10-CM | POA: Diagnosis not present

## 2024-01-15 DIAGNOSIS — Z716 Tobacco abuse counseling: Secondary | ICD-10-CM | POA: Diagnosis not present

## 2024-01-15 DIAGNOSIS — I10 Essential (primary) hypertension: Secondary | ICD-10-CM | POA: Diagnosis not present

## 2024-01-15 DIAGNOSIS — R0989 Other specified symptoms and signs involving the circulatory and respiratory systems: Secondary | ICD-10-CM

## 2024-01-15 DIAGNOSIS — E1169 Type 2 diabetes mellitus with other specified complication: Secondary | ICD-10-CM

## 2024-01-15 LAB — BAYER DCA HB A1C WAIVED: HB A1C (BAYER DCA - WAIVED): 5.7 % — ABNORMAL HIGH (ref 4.8–5.6)

## 2024-01-15 MED ORDER — VARENICLINE TARTRATE 1 MG PO TABS: 1.0000 mg | ORAL_TABLET | Freq: Two times a day (BID) | ORAL | 2 refills | Status: AC

## 2024-01-15 MED ORDER — VARENICLINE TARTRATE (STARTER) 0.5 MG X 11 & 1 MG X 42 PO TBPK: ORAL_TABLET | ORAL | 0 refills | Status: AC

## 2024-01-15 NOTE — Addendum Note (Signed)
 Addended by: BRANDY SETTER D on: 01/15/2024 01:46 PM   Modules accepted: Orders
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
## 2024-01-16 DIAGNOSIS — Z780 Asymptomatic menopausal state: Secondary | ICD-10-CM | POA: Diagnosis not present

## 2024-01-16 DIAGNOSIS — M8589 Other specified disorders of bone density and structure, multiple sites: Secondary | ICD-10-CM | POA: Diagnosis not present

## 2024-01-16 LAB — LIPID PANEL
Chol/HDL Ratio: 1.7 ratio (ref 0.0–4.4)
Cholesterol, Total: 118 mg/dL (ref 100–199)
HDL: 71 mg/dL (ref >39)
LDL Chol Calc (NIH): 35 mg/dL (ref 0–99)
Triglycerides: 51 mg/dL (ref 0–149)
VLDL Cholesterol Cal: 12 mg/dL (ref 5–40)

## 2024-01-16 LAB — CBC WITH DIFFERENTIAL/PLATELET
Basophils Absolute: 0.1 x10E3/uL (ref 0.0–0.2)
Basos: 1 %
EOS (ABSOLUTE): 0.1 x10E3/uL (ref 0.0–0.4)
Eos: 1 %
Hematocrit: 46.7 % — ABNORMAL HIGH (ref 34.0–46.6)
Hemoglobin: 15.1 g/dL (ref 11.1–15.9)
Immature Grans (Abs): 0 x10E3/uL (ref 0.0–0.1)
Immature Granulocytes: 0 %
Lymphocytes Absolute: 2.8 x10E3/uL (ref 0.7–3.1)
Lymphs: 32 %
MCH: 29.5 pg (ref 26.6–33.0)
MCHC: 32.3 g/dL (ref 31.5–35.7)
MCV: 91 fL (ref 79–97)
Monocytes Absolute: 0.7 x10E3/uL (ref 0.1–0.9)
Monocytes: 8 %
Neutrophils Absolute: 5 x10E3/uL (ref 1.4–7.0)
Neutrophils: 58 %
Platelets: 256 x10E3/uL (ref 150–450)
RBC: 5.11 x10E6/uL (ref 3.77–5.28)
RDW: 12.9 % (ref 11.7–15.4)
WBC: 8.7 x10E3/uL (ref 3.4–10.8)

## 2024-01-16 LAB — CMP14+EGFR
ALT: 15 IU/L (ref 0–32)
AST: 17 IU/L (ref 0–40)
Albumin: 4.3 g/dL (ref 3.9–4.9)
Alkaline Phosphatase: 88 IU/L (ref 49–135)
BUN/Creatinine Ratio: 16 (ref 12–28)
BUN: 15 mg/dL (ref 8–27)
Bilirubin Total: 0.5 mg/dL (ref 0.0–1.2)
CO2: 24 mmol/L (ref 20–29)
Calcium: 9 mg/dL (ref 8.7–10.3)
Chloride: 104 mmol/L (ref 96–106)
Creatinine, Ser: 0.91 mg/dL (ref 0.57–1.00)
Globulin, Total: 2.2 g/dL (ref 1.5–4.5)
Glucose: 85 mg/dL (ref 70–99)
Potassium: 4.3 mmol/L (ref 3.5–5.2)
Sodium: 141 mmol/L (ref 134–144)
Total Protein: 6.5 g/dL (ref 6.0–8.5)
eGFR: 69 mL/min/1.73 (ref >59)

## 2024-01-17 DIAGNOSIS — E113312 Type 2 diabetes mellitus with moderate nonproliferative diabetic retinopathy with macular edema, left eye: Secondary | ICD-10-CM | POA: Diagnosis not present

## 2024-01-17 DIAGNOSIS — H43813 Vitreous degeneration, bilateral: Secondary | ICD-10-CM | POA: Diagnosis not present

## 2024-01-17 DIAGNOSIS — H35033 Hypertensive retinopathy, bilateral: Secondary | ICD-10-CM | POA: Diagnosis not present

## 2024-01-17 DIAGNOSIS — E113411 Type 2 diabetes mellitus with severe nonproliferative diabetic retinopathy with macular edema, right eye: Secondary | ICD-10-CM | POA: Diagnosis not present

## 2024-01-22 ENCOUNTER — Ambulatory Visit: Payer: Self-pay | Admitting: Family Medicine

## 2024-01-25 DIAGNOSIS — H5213 Myopia, bilateral: Secondary | ICD-10-CM | POA: Diagnosis not present

## 2024-01-25 DIAGNOSIS — H524 Presbyopia: Secondary | ICD-10-CM | POA: Diagnosis not present

## 2024-02-10 ENCOUNTER — Ambulatory Visit: Admitting: Family Medicine

## 2024-02-10 ENCOUNTER — Other Ambulatory Visit: Payer: Self-pay | Admitting: Family Medicine

## 2024-02-10 ENCOUNTER — Encounter: Payer: Self-pay | Admitting: Family Medicine

## 2024-02-10 VITALS — BP 175/66 | HR 52 | Temp 97.8°F | Ht 64.0 in | Wt 183.0 lb

## 2024-02-10 DIAGNOSIS — B3731 Acute candidiasis of vulva and vagina: Secondary | ICD-10-CM

## 2024-02-10 DIAGNOSIS — E1169 Type 2 diabetes mellitus with other specified complication: Secondary | ICD-10-CM

## 2024-02-10 MED ORDER — FLUCONAZOLE 100 MG PO TABS: ORAL_TABLET | ORAL | 0 refills | Status: AC

## 2024-02-10 NOTE — Progress Notes (Signed)
 Subjective:  Patient ID: Kimberly Donaldson, female    DOB: 09-17-1955  Age: 68 y.o. MRN: [id number]  CC: Vaginitis (Started 3 weeks ago. Pt tried the 1 day treament an dthen it came back then pt tried suppositories for 3 days and its back agin. Itching and burning. )   HPI  Discussed the use of AI scribe software for clinical note transcription with the patient, who gave verbal consent to proceed.  History of Present Illness Kimberly Donaldson is a 68 year old female with diabetes who presents with persistent vulvovaginal itching and burning.  She has been experiencing itching and burning in the vulvovaginal area for the past three weeks. Initially, she used a one-day cream which provided some relief, but the symptoms persisted. After a weekend trip, the symptoms returned, prompting her to use a three-day suppository treatment, which she found inconvenient and ineffective as it did not stay in place. She also tried using Magisil cream without success.  No vaginal discharge is present, and she has not used any recent antibiotics that could have precipitated the symptoms. She is unsure of any specific trigger for the onset of symptoms, noting that 'nothing's changed' in her routine. Her diabetes is well-controlled with a recent A1c of 5.1. She has experienced similar symptoms in the past, but only when on antibiotics, which was a long time ago.  No associated lower back or abdominal pain. The itching and burning are severe enough to impact her ability to sit or stand comfortably.          02/10/2024   11:01 AM 01/15/2024    1:14 PM 11/04/2023    1:43 PM  Depression screen PHQ 2/9  Decreased Interest 0 0 0  Down, Depressed, Hopeless 0 1 1  PHQ - 2 Score 0 1 1  Altered sleeping 0 0   Tired, decreased energy 0 0   Change in appetite 0 0   Feeling bad or failure about yourself  0 0   Trouble concentrating 0 0   Moving slowly or fidgety/restless 0 0   Suicidal thoughts 0 0   PHQ-9  Score 0 1    Difficult doing work/chores Not difficult at all Not difficult at all      Data saved with a previous flowsheet row definition    History Kimberly Donaldson has a past medical history of Arthritis, Diabetes mellitus without complication (HCC), Hypertension, and Retina disorder, bilateral.   She has a past surgical history that includes Cesarean section; Foot surgery; Wrist surgery; Colonoscopy (11/11/2007); ABDOMINAL AORTOGRAM W/LOWER EXTREMITY (N/A, 07/07/2021); Eye surgery; Cholecystectomy; Femoral-popliteal Bypass Graft (Left, 07/18/2021); Intraoperative arteriogram (Left, 07/18/2021); Fracture surgery; and Tubal ligation.   Her family history includes Bladder Cancer in her father; Cancer in her father and mother; Colon polyps in her father; Diabetes in her father; Liver cancer in her mother.She reports that she has been smoking cigarettes. She has never been exposed to tobacco smoke. She has never used smokeless tobacco. She reports current alcohol use. She reports current drug use. Drug: Marijuana.    ROS Review of Systems  Objective:  BP (!) 175/66   Pulse (!) 52   Temp 97.8 F (36.6 C)   Ht 5' 4 (1.626 m)   Wt 183 lb (83 kg)   SpO2 99%   BMI 31.41 kg/m   BP Readings from Last 3 Encounters:  02/10/24 (!) 175/66  01/15/24 (!) 147/58  01/10/24 (!) 154/59    Wt Readings from Last 3  Encounters:  02/10/24 183 lb (83 kg)  01/15/24 181 lb (82.1 kg)  01/10/24 184 lb 11.2 oz (83.8 kg)     Physical Exam Physical Exam GENERAL: Alert, cooperative, well developed, no acute distress HEENT: Normocephalic, normal oropharynx, moist mucous membranes CHEST: Clear to auscultation bilaterally, No wheezes, rhonchi, or crackles CARDIOVASCULAR: Normal heart rate and rhythm, S1 and S2 normal without murmurs ABDOMEN: Soft, non-tender, non-distended, without organomegaly, Normal bowel sounds EXTREMITIES: No cyanosis or edema NEUROLOGICAL: Cranial nerves grossly intact, Moves all  extremities without gross motor or sensory deficit   Assessment & Plan:  Candida vaginitis  Other orders -     Fluconazole ; Take two with first dose. Then starting the next day take one daily until all are taken.  Dispense: 15 tablet; Refill: 0    Assessment and Plan Assessment & Plan Vulvovaginal candidiasis   She has experienced persistent vulvovaginal candidiasis for three weeks, with itching and burning in the vulva and vaginal area, but no discharge. Previous treatments with topical cream and suppository provided only temporary relief. There is no recent antibiotic use, and her glycemic control is good with an A1c of 5.1. If treatment fails, other causes of vulvovaginal symptoms will be considered. Prescribe oral antifungal medication: take two tablets today, then one tablet daily for two weeks.       Follow-up: Return if symptoms worsen or fail to improve.  Butler Der, M.D.

## 2024-02-18 ENCOUNTER — Ambulatory Visit: Admitting: Vascular Surgery

## 2024-03-04 ENCOUNTER — Encounter: Payer: Self-pay | Admitting: Nurse Practitioner

## 2024-03-04 ENCOUNTER — Ambulatory Visit (INDEPENDENT_AMBULATORY_CARE_PROVIDER_SITE_OTHER): Admitting: Nurse Practitioner

## 2024-03-04 ENCOUNTER — Ambulatory Visit: Payer: Self-pay

## 2024-03-04 VITALS — BP 152/65 | HR 55 | Temp 97.8°F | Ht 64.0 in | Wt 184.4 lb

## 2024-03-04 DIAGNOSIS — R350 Frequency of micturition: Secondary | ICD-10-CM

## 2024-03-04 DIAGNOSIS — N3001 Acute cystitis with hematuria: Secondary | ICD-10-CM | POA: Diagnosis not present

## 2024-03-04 DIAGNOSIS — R300 Dysuria: Secondary | ICD-10-CM

## 2024-03-04 LAB — MICROSCOPIC EXAMINATION
Renal Epithel, UA: NONE SEEN /HPF
Yeast, UA: NONE SEEN

## 2024-03-04 LAB — URINALYSIS, ROUTINE W REFLEX MICROSCOPIC
Bilirubin, UA: NEGATIVE
Glucose, UA: NEGATIVE
Ketones, UA: NEGATIVE
Nitrite, UA: POSITIVE — AB
Specific Gravity, UA: 1.02 (ref 1.005–1.030)
Urobilinogen, Ur: 0.2 mg/dL (ref 0.2–1.0)
pH, UA: 5.5 (ref 5.0–7.5)

## 2024-03-04 MED ORDER — SULFAMETHOXAZOLE-TRIMETHOPRIM 800-160 MG PO TABS: 1.0000 | ORAL_TABLET | Freq: Two times a day (BID) | ORAL | 0 refills | Status: AC

## 2024-03-04 NOTE — Telephone Encounter (Signed)
 FYI Only or Action Required?: FYI only for provider: appointment scheduled on 03/04/2024.  Patient was last seen in primary care on 02/10/2024 by Zollie Lowers, MD.  Called Nurse Triage reporting No chief complaint on file..  Symptoms began today.  Interventions attempted: Nothing.  Symptoms are: stable.  Triage Disposition: See HCP Within 4 Hours (Or PCP Triage)  Patient/caregiver understands and will follow disposition?: Yes   Reason for Disposition  Diabetes mellitus or weak immune system (e.g., HIV positive, cancer chemo, splenectomy, organ transplant, chronic steroids)  Answer Assessment - Initial Assessment Questions 1. SEVERITY: How bad is the pain?  (e.g., Scale 1-10; mild, moderate, or severe)     5-6/10 moderate  2. FREQUENCY: How many times have you had painful urination today?      Colvin /urgency 3-4 times today , each time had pain with urination  3. PATTERN: Is pain present every time you urinate or just sometimes?      Every time today  4. ONSET: When did the painful urination start?      Today, last time dribbles urine  , before that normal  amout earlier this am  5. FEVER: Do you have a fever? If Yes, ask: What is your temperature, how was it measured, and when did it start?     No fever or chills 6. PAST UTI: Have you had a urine infection before? If Yes, ask: When was the last time? and What happened that time?      Hx UTI most recent about  6 months ago 7. CAUSE: What do you think is causing the painful urination?  (e.g., UTI, scratch, Herpes sore)     Unsure , but has hx UTI 8. OTHER SYMPTOMS: Do you have any other symptoms? (e.g., blood in urine, flank pain, genital sores, urgency, vaginal discharge)     Urgency, frequency -  denies blood in urine, abdominal pain, flank pain, lower back, nausea, vomiting  Protocols used: Urination Pain - Female-A-AH  Copied from CRM #[id number]. Topic: Clinical - Red Word Triage >> Mar 04, 2024  1:09  PM Sophia H wrote: Red Word that prompted transfer to Nurse Triage:   UTI symptoms - burning when urinating first noticed today

## 2024-03-04 NOTE — Progress Notes (Signed)
 Subjective:  Patient ID: Kimberly Donaldson, female    DOB: 07/28/1955, 68 y.o.   MRN: [id number]  Patient Care Team: Dettinger, Fonda LABOR, MD as PCP - General (Family Medicine)   Chief Complaint:  Palmer (Symptoms started today ) and Urinary Frequency   HPI: Kimberly Donaldson is a 68 y.o. female presenting on 03/04/2024 for Dysuria (Symptoms started today ) and Urinary Frequency   Discussed the use of AI scribe software for clinical note transcription with the patient, who gave verbal consent to proceed.  History of Present Illness Kimberly Donaldson is a 68 year old female who presents with symptoms suggestive of a urinary tract infection.  She began experiencing symptoms this morning, including dysuria and increased urinary frequency. There is no burning sensation with urination at this time.  She has not taken any medication for these symptoms yet and has provided a urine sample for further evaluation.  No constipation.      Relevant past medical, surgical, family, and social history reviewed and updated as indicated.  Allergies and medications reviewed and updated. Data reviewed: Chart in Epic.   Past Medical History:  Diagnosis Date   Arthritis    Diabetes mellitus without complication (HCC)    Hypertension    Retina disorder, bilateral     Past Surgical History:  Procedure Laterality Date   ABDOMINAL AORTOGRAM W/LOWER EXTREMITY N/A 07/07/2021   Procedure: ABDOMINAL AORTOGRAM W/LOWER EXTREMITY;  Surgeon: Eliza Lonni RAMAN, MD;  Location: [facility] INVASIVE CV LAB;  Service: Cardiovascular;  Laterality: N/A;   CESAREAN SECTION     CHOLECYSTECTOMY     COLONOSCOPY  11/11/2007   Dr.Jacobs   EYE SURGERY     bilateral cataracts   FEMORAL-POPLITEAL BYPASS GRAFT Left 07/18/2021   Procedure: LEFT FEMORAL-POPLITEAL ARTERY BYPASS WITH VEIN;  Surgeon: Eliza Lonni RAMAN, MD;  Location: [facility] OR;  Service: Vascular;  Laterality: Left;   FOOT SURGERY     FRACTURE  SURGERY     INTRAOPERATIVE ARTERIOGRAM Left 07/18/2021   Procedure: INTRA OPERATIVE ARTERIOGRAM;  Surgeon: Eliza Lonni RAMAN, MD;  Location: [facility] OR;  Service: Vascular;  Laterality: Left;   TUBAL LIGATION     WRIST SURGERY      Social History   Socioeconomic History   Marital status: Widowed    Spouse name: Not on file   Number of children: 4   Years of education: Not on file   Highest education level: 12th grade  Occupational History   Occupation: retired  Tobacco Use   Smoking status: Every Day    Current packs/day: 1.00    Types: Cigarettes    Passive exposure: Never   Smokeless tobacco: Never  Vaping Use   Vaping status: Never Used  Substance and Sexual Activity   Alcohol use: Yes    Comment: occ   Drug use: Yes    Types: Marijuana    Comment: gummies every 2-3 months   Sexual activity: Not Currently    Birth control/protection: Post-menopausal  Other Topics Concern   Not on file  Social History Narrative   Daughter and 2 grandchildren live with her   5 grandchildren   Social Drivers of Health   Tobacco Use: High Risk (03/04/2024)   Patient History    Smoking Tobacco Use: Every Day    Smokeless Tobacco Use: Never    Passive Exposure: Never  Financial Resource Strain: Low Risk (11/04/2023)   Overall Financial Resource Strain (CARDIA)    Difficulty of  Paying Living Expenses: Not hard at all  Food Insecurity: Food Insecurity Present (11/04/2023)   Epic    Worried About Programme Researcher, Broadcasting/film/video in the Last Year: Sometimes true    Ran Out of Food in the Last Year: Never true  Transportation Needs: No Transportation Needs (11/04/2023)   Epic    Lack of Transportation (Medical): No    Lack of Transportation (Non-Medical): No  Physical Activity: Inactive (11/04/2023)   Exercise Vital Sign    Days of Exercise per Week: 0 days    Minutes of Exercise per Session: 0 min  Stress: No Stress Concern Present (11/04/2023)   [employer] of Occupational Health -  Occupational Stress Questionnaire    Feeling of Stress: Only a little  Social Connections: Moderately Integrated (11/04/2023)   Social Connection and Isolation Panel    Frequency of Communication with Friends and Family: More than three times a week    Frequency of Social Gatherings with Friends and Family: Once a week    Attends Religious Services: More than 4 times per year    Active Member of [employer] or Organizations: Yes    Attends Banker Meetings: More than 4 times per year    Marital Status: Widowed  Intimate Partner Violence: Not At Risk (11/04/2023)   Epic    Fear of Current or Ex-Partner: No    Emotionally Abused: No    Physically Abused: No    Sexually Abused: No  Depression (PHQ2-9): Low Risk (02/10/2024)   Depression (PHQ2-9)    PHQ-2 Score: 0  Alcohol Screen: Low Risk (11/04/2023)   Alcohol Screen    Last Alcohol Screening Score (AUDIT): 0  Housing: Unknown (11/04/2023)   Epic    Unable to Pay for Housing in the Last Year: No    Number of Times Moved in the Last Year: Not on file    Homeless in the Last Year: No  Utilities: Not At Risk (11/04/2023)   Epic    Threatened with loss of utilities: No  Health Literacy: Adequate Health Literacy (11/04/2023)   B1300 Health Literacy    Frequency of need for help with medical instructions: Never    Outpatient Encounter Medications as of 03/04/2024  Medication Sig   amLODipine  (NORVASC ) 5 MG tablet Take 1 tablet (5 mg total) by mouth daily.   aspirin  EC 81 MG tablet Take 81 mg by mouth daily. Swallow whole.   buPROPion  (WELLBUTRIN  XL) 150 MG 24 hr tablet Take 1 tablet (150 mg total) by mouth daily.   collagenase  (SANTYL ) 250 UNIT/GM ointment Apply 1 Application topically daily.   fluconazole  (DIFLUCAN ) 100 MG tablet Take two with first dose. Then starting the next day take one daily until all are taken.   ibuprofen (ADVIL,MOTRIN) 200 MG tablet Take 800 mg by mouth every 6 (six) hours as needed for moderate pain.    lisinopril  (ZESTRIL ) 20 MG tablet Take 2 tablets (40 mg total) by mouth daily.   loperamide (IMODIUM) 2 MG capsule Take 2 mg by mouth as needed for diarrhea or loose stools.   Multiple Vitamins-Minerals (AIRBORNE PO) Take 1 tablet by mouth daily.   nystatin -triamcinolone  ointment (MYCOLOG) Apply 1 Application topically 2 (two) times daily.   Omega-3 Fatty Acids (FISH OIL) 1000 MG CAPS Take 1,000 mg by mouth daily.   rosuvastatin  (CRESTOR ) 5 MG tablet Take 1 tablet (5 mg total) by mouth daily.   Semaglutide ,0.25 or 0.5MG /DOS, (OZEMPIC , 0.25 OR 0.5 MG/DOSE,) 2 MG/3ML SOPN INJECT 0.5 MG  SUBCUTANEOUSLY ONCE A WEEK   sulfamethoxazole -trimethoprim  (BACTRIM  DS) 800-160 MG tablet Take 1 tablet by mouth 2 (two) times daily for 7 days.   tobramycin (TOBREX) 0.3 % ophthalmic solution Place 1 drop into both eyes See admin instructions. Used before eye injections every 3 months   varenicline  (CHANTIX  CONTINUING MONTH PAK) 1 MG tablet Take 1 tablet (1 mg total) by mouth 2 (two) times daily.   No facility-administered encounter medications on file as of 03/04/2024.    Allergies[1]  Pertinent ROS per HPI, otherwise unremarkable      Objective:  BP (!) 152/65   Pulse (!) 55   Temp 97.8 F (36.6 C) (Temporal)   Ht 5' 4 (1.626 m)   Wt 184 lb 6.4 oz (83.6 kg)   SpO2 99%   BMI 31.65 kg/m    Wt Readings from Last 3 Encounters:  03/04/24 184 lb 6.4 oz (83.6 kg)  02/10/24 183 lb (83 kg)  01/15/24 181 lb (82.1 kg)    Physical Exam Vitals and nursing note reviewed.  Constitutional:      General: She is not in acute distress. HENT:     Head: Normocephalic and atraumatic.     Nose: Nose normal.     Mouth/Throat:     Mouth: Mucous membranes are moist.  Eyes:     Extraocular Movements: Extraocular movements intact.     Conjunctiva/sclera: Conjunctivae normal.     Pupils: Pupils are equal, round, and reactive to light.  Cardiovascular:     Heart sounds: Normal heart sounds.  Pulmonary:      Effort: Pulmonary effort is normal.     Breath sounds: Normal breath sounds.  Abdominal:     General: Bowel sounds are normal.     Palpations: Abdomen is soft.  Musculoskeletal:        General: Normal range of motion.     Right lower leg: No edema.     Left lower leg: No edema.  Skin:    General: Skin is warm and dry.     Findings: No rash.  Neurological:     Mental Status: She is alert.  Psychiatric:        Mood and Affect: Mood normal.        Behavior: Behavior normal.        Thought Content: Thought content normal.        Judgment: Judgment normal.    Physical Exam    Urine dipstick shows positive for RBC's, positive for protein, positive for nitrates, and positive for leukocytes.  Micro exam: 11-30 WBC's per HPF, 0-2 RBC's per HPF, and mod+ bacteria.   Results for orders placed or performed in visit on 01/15/24  Bayer DCA Hb A1c Waived   Collection Time: 01/15/24 12:55 PM  Result Value Ref Range   HB A1C (BAYER DCA - WAIVED) 5.7 (H) 4.8 - 5.6 %  CBC with Differential/Platelet   Collection Time: 01/15/24 12:56 PM  Result Value Ref Range   WBC 8.7 3.4 - 10.8 x10E3/uL   RBC 5.11 3.77 - 5.28 x10E6/uL   Hemoglobin 15.1 11.1 - 15.9 g/dL   Hematocrit 53.2 (H) 65.9 - 46.6 %   MCV 91 79 - 97 fL   MCH 29.5 26.6 - 33.0 pg   MCHC 32.3 31.5 - 35.7 g/dL   RDW 87.0 88.2 - 84.5 %   Platelets 256 150 - 450 x10E3/uL   Neutrophils 58 Not Estab. %   Lymphs 32 Not Estab. %   Monocytes  8 Not Estab. %   Eos 1 Not Estab. %   Basos 1 Not Estab. %   Neutrophils Absolute 5.0 1.4 - 7.0 x10E3/uL   Lymphocytes Absolute 2.8 0.7 - 3.1 x10E3/uL   Monocytes Absolute 0.7 0.1 - 0.9 x10E3/uL   EOS (ABSOLUTE) 0.1 0.0 - 0.4 x10E3/uL   Basophils Absolute 0.1 0.0 - 0.2 x10E3/uL   Immature Granulocytes 0 Not Estab. %   Immature Grans (Abs) 0.0 0.0 - 0.1 x10E3/uL  CMP14+EGFR   Collection Time: 01/15/24 12:56 PM  Result Value Ref Range   Glucose 85 70 - 99 mg/dL   BUN 15 8 - 27 mg/dL    Creatinine, Ser 9.08 0.57 - 1.00 mg/dL   eGFR 69 >40 fO/fpw/8.26   BUN/Creatinine Ratio 16 12 - 28   Sodium 141 134 - 144 mmol/L   Potassium 4.3 3.5 - 5.2 mmol/L   Chloride 104 96 - 106 mmol/L   CO2 24 20 - 29 mmol/L   Calcium  9.0 8.7 - 10.3 mg/dL   Total Protein 6.5 6.0 - 8.5 g/dL   Albumin 4.3 3.9 - 4.9 g/dL   Globulin, Total 2.2 1.5 - 4.5 g/dL   Bilirubin Total 0.5 0.0 - 1.2 mg/dL   Alkaline Phosphatase 88 49 - 135 IU/L   AST 17 0 - 40 IU/L   ALT 15 0 - 32 IU/L  Lipid panel   Collection Time: 01/15/24 12:56 PM  Result Value Ref Range   Cholesterol, Total 118 100 - 199 mg/dL   Triglycerides 51 0 - 149 mg/dL   HDL 71 >60 mg/dL   VLDL Cholesterol Cal 12 5 - 40 mg/dL   LDL Chol Calc (NIH) 35 0 - 99 mg/dL   Chol/HDL Ratio 1.7 0.0 - 4.4 ratio       Pertinent labs & imaging results that were available during my care of the patient were reviewed by me and considered in my medical decision making.  Assessment & Plan:  Deosha Werden was seen today for dysuria and urinary frequency.  Diagnoses and all orders for this visit:  Dysuria -     Urinalysis, Routine w reflex microscopic -     sulfamethoxazole -trimethoprim  (BACTRIM  DS) 800-160 MG tablet; Take 1 tablet by mouth 2 (two) times daily for 7 days. -     Urine Culture  Acute cystitis with hematuria -     sulfamethoxazole -trimethoprim  (BACTRIM  DS) 800-160 MG tablet; Take 1 tablet by mouth 2 (two) times daily for 7 days. -     Urine Culture  Urinary frequency -     Urinalysis, Routine w reflex microscopic -     sulfamethoxazole -trimethoprim  (BACTRIM  DS) 800-160 MG tablet; Take 1 tablet by mouth 2 (two) times daily for 7 days. -     Urine Culture     Assessment and Plan Kimberly Donaldson is a 68 year old Caucasian female seen today for acute cystitis, no acute distress. Assessment & Plan Acute cystitis Symptoms of dysuria and urinary frequency. Awaiting urinalysis to confirm diagnosis and guide treatment. - Ordered urinalysis. -  Initiate empirical antibiotic therapy with Bactrim  based on urinalysis results. - Await culture results for final antibiotic therapy. She understand based on culture result antibiotic may be needed at and    Continue all other maintenance medications.  Follow up plan: Return if symptoms worsen or fail to improve.   Continue healthy lifestyle choices, including diet (rich in fruits, vegetables, and lean proteins, and low in salt and simple carbohydrates) and exercise (at least 30 minutes  of moderate physical activity daily).  Educational handout given for   Clinical References  Urinary Tract Infection, Female A urinary tract infection (UTI) is an infection in your urinary tract. The urinary tract is made up of organs that make, store, and get rid of pee (urine) in your body. These organs include: The kidneys. The ureters. The bladder. The urethra. What are the causes? Most UTIs are caused by germs called bacteria. They may be in or near your genitals. These germs grow and cause swelling in your urinary tract. What increases the risk? You're more likely to get a UTI if: You're a female. The urethra is shorter in females than in males. You have a soft tube called a catheter that drains your pee. You can't control when you pee or poop. You have trouble peeing because of: A kidney stone. A urinary blockage. A nerve condition that affects your bladder. Not getting enough to drink. You're sexually active. You use a birth control inside your vagina, like spermicide. You're pregnant. You have low levels of the hormone estrogen in your body. You're an older adult. You're also more likely to get a UTI if you have other health problems. These may include: Diabetes. A weak immune system. Your immune system is your body's defense system. Sickle cell disease. Injury of the spine. What are the signs or symptoms? Symptoms may include: Needing to pee right away. Peeing small amounts  often. Pain or burning when you pee. Blood in your pee. Pee that smells bad or odd. Pain in your belly or lower back. You may also: Feel confused. This may be the first symptom in older adults. Vomit. Not feel hungry. Feel tired or easily annoyed. Have a fever or chills. How is this diagnosed? A UTI is diagnosed based on your medical history and an exam. You may also have other tests. These may include: Pee tests. Blood tests. Tests for sexually transmitted infections (STIs). If you've had more than one UTI, you may need to have imaging studies done to find out why you keep getting them. How is this treated? A UTI can be treated by: Taking antibiotics or other medicines. Drinking enough fluid to keep your pee pale yellow. In rare cases, a UTI can cause a very bad condition called sepsis. Sepsis may be treated in the hospital. Follow these instructions at home: Medicines Take your medicines only as told by your health care provider. If you were given antibiotics, take them as told by your provider. Do not stop taking them even if you start to feel better. General instructions Make sure you: Pee often and fully. Do not hold your pee for a long time. Wipe from front to back after you pee or poop. Use each tissue only once when you wipe. Pee after you have sex. Do not douche or use sprays or powders in your genital area. Contact a health care provider if: Your symptoms don't get better after 1-2 days of taking antibiotics. Your symptoms go away and then come back. You have a fever or chills. You vomit or feel like you may vomit. Get help right away if: You have very bad pain in your back or lower belly. You faint. This information is not intended to replace advice given to you by your health care provider. Make sure you discuss any questions you have with your health care provider. Document Revised: 02/13/2023 Document Reviewed: 06/08/2022 Elsevier Patient Education  2025  Else[employer] Dysuria Dysuria is pain or discomfort when  you pee. The pain may be felt in your urethra, which is the part of your body that drains pee (urine) from your bladder. The pain may also be felt near your genitals, groin, or in your lower belly or back. You may have to pee often or have the sudden feeling that you need to pee. This condition can affect anyone, but it's more common in females. It can be caused by: A urinary tract infection (UTI). Kidney stones or bladder stones. Some sexually transmitted infections (STIs). Dehydration. This is when there's not enough water in your body. Irritation and swelling in the vagina. The use of some medicines. The use of some soaps or products with a scent. Follow these instructions at home: Medicines  Take your medicines only as told. Take your antibiotics as told. Do not stop taking them even if you start to feel better. Eating and drinking Drink enough fluid to keep your pee pale yellow. Certain drinks can make the pain worse. Avoid: Drinks with caffeine in them. Tea. Alcohol. In males, alcohol may irritate the prostate. General instructions Watch your condition for any changes, such as color changes in your pee. Pee often. Do not hold your pee for a long time. If you're female, wipe from front to back after you pee or poop. Use each tissue only once when you wipe. Pee after you have sex. If you've had any tests done, it's up to you to get your test results. Ask your health care provider, or the department doing the test, when your results will be ready. Contact a health care provider if: You have a fever or chills. You have pain in your back or sides. You throw up or feel like you may throw up. You have blood in your pee. You're not peeing as often as normal. You feel very weak. Get help right away if: You have very bad pain that doesn't get better with medicine. You're confused. You have a fast heartbeat while resting. This  information is not intended to replace advice given to you by your health care provider. Make sure you discuss any questions you have with your health care provider. Document Revised: 07/10/2022 Document Reviewed: 07/10/2022 Elsevier Patient Education  2024 Elsevier Inc. Peeing Often (Urinary Frequency) in Adults Urinary frequency means peeing, or urinating, more often than usual. This is also called overactive bladder. People usually pee 6 or 7 times in 24 hours. If you're concerned that you pee more often than usual, or if peeing often is starting to affect your day-to-day life, you may have an overactive bladder. How often you pee depends on: How much you drink and what you drink. How much you exercise. What medicines you're taking. The health of your bladder muscles and pelvic muscles. If you pee often, you may also feel an urgent need to pee. The stress and anxiety of needing to find a bathroom quickly can make this problem feel worse. This problem may go away on its own. You may also wish to seek treatment. Home treatments are the first step in treating the problem. Surgery may be done if home treatment fails. Follow these instructions at home: Bladder health Your health care provider can suggest ways to help you improve your bladder health. You may be told to: Start a bladder diary. Keep track of these things: How often you pee. How much you pee at one time. What you eat and drink. If you leak any pee. Follow a bladder training program. You may be  told to: Wait before going to the bathroom. In doing this, you learn to ignore some of the urges to pee. Wait a minute after you pee and try to pee again. This helps if you feel you are not completely emptying your bladder. Set a time to pee. The goal is to slowly increase the amount of time in which you wait to pee. Do Kegel exercises. These exercises help the muscles that control peeing to become stronger. This may help you control the  urge to pee.   Eating and drinking Eat and drink as told. You may be told to: Avoid caffeine. Drink small amounts of fluid more often. Avoid drinking in the evening. Stop drinking alcohol. Avoid foods or drinks that may irritate the bladder, such as: Coffee, tea, or soda. Artificial sweeteners. Citrus fruits, tomato-based foods, and chocolate. Urinary frequency may be worse if you have trouble pooping (constipation). You may need to take these steps to help prevent or treat the problem: Take medicines to help you poop. Eat foods high in fiber, like beans, whole grains, and fresh fruits and vegetables. Drink more fluids as told. General instructions Take your medicines only as told. Keep all follow-up visits. Your provider needs to check if the home treatments are working or if you need more treatment. Contact a health care provider if: You start peeing more than before. You feel pain when you pee. You notice blood in your pee. Your pee looks cloudy. You have a fever or chills. You throw up or you feel like you may throw up. Get help right away if: You can't pee. This information is not intended to replace advice given to you by your health care provider. Make sure you discuss any questions you have with your health care provider. Document Revised: 12/13/2022 Document Reviewed: 12/13/2022 Elsevier Patient Education  2025 [employer].  The above assessment and management plan was discussed with the patient. The patient verbalized understanding of and has agreed to the management plan. Patient is aware to call the clinic if they develop any new symptoms or if symptoms persist or worsen. Patient is aware when to return to the clinic for a follow-up visit. Patient educated on when it is appropriate to go to the emergency department.   Sharran Caratachea [place] Thompson, DNP [facility] [address] [place], [place] [address] [phone]      [1]   Allergies Allergen Reactions   Codeine     Ringing in ears   Morphine Hives   Other Hives    Sporanox - for nail fungus

## 2024-03-04 NOTE — Telephone Encounter (Signed)
 Noted. Patient is scheduled to be seen today for this issue.

## 2024-03-05 ENCOUNTER — Ambulatory Visit: Payer: Self-pay | Admitting: Nurse Practitioner

## 2024-07-15 ENCOUNTER — Ambulatory Visit: Admitting: Family Medicine

## 2024-11-04 ENCOUNTER — Ambulatory Visit: Payer: Self-pay
